# Patient Record
Sex: Female | Born: 1970 | Race: White | Hispanic: No | Smoking: Former smoker
Health system: Southern US, Community
[De-identification: ages and names within clinical notes are randomized; demographics above are authoritative.]

## PROBLEM LIST (undated history)

## (undated) DIAGNOSIS — F32A Depression, unspecified: Secondary | ICD-10-CM

## (undated) DIAGNOSIS — F329 Major depressive disorder, single episode, unspecified: Secondary | ICD-10-CM

## (undated) DIAGNOSIS — E039 Hypothyroidism, unspecified: Secondary | ICD-10-CM

## (undated) DIAGNOSIS — K7290 Hepatic failure, unspecified without coma: Secondary | ICD-10-CM

## (undated) DIAGNOSIS — F209 Schizophrenia, unspecified: Secondary | ICD-10-CM

## (undated) DIAGNOSIS — G43909 Migraine, unspecified, not intractable, without status migrainosus: Secondary | ICD-10-CM

## (undated) DIAGNOSIS — F1010 Alcohol abuse, uncomplicated: Secondary | ICD-10-CM

## (undated) HISTORY — DX: Depression, unspecified: F32.A

## (undated) HISTORY — DX: Migraine, unspecified, not intractable, without status migrainosus: G43.909

## (undated) HISTORY — DX: Major depressive disorder, single episode, unspecified: F32.9

## (undated) HISTORY — DX: Alcohol abuse, uncomplicated: F10.10

## (undated) HISTORY — DX: Hepatic failure, unspecified without coma: K72.90

---

## 1991-11-02 HISTORY — PX: CHOLECYSTECTOMY: SHX55

## 1991-11-02 HISTORY — PX: GASTRIC BYPASS: SHX52

## 2017-09-27 ENCOUNTER — Encounter: Payer: Self-pay | Admitting: Family Medicine

## 2017-12-06 ENCOUNTER — Emergency Department (HOSPITAL_COMMUNITY): Payer: BLUE CROSS/BLUE SHIELD

## 2017-12-06 ENCOUNTER — Inpatient Hospital Stay (HOSPITAL_COMMUNITY)
Admission: EM | Admit: 2017-12-06 | Discharge: 2017-12-16 | DRG: 432 | Disposition: A | Discharge status: Home Health | Payer: BLUE CROSS/BLUE SHIELD | Attending: Internal Medicine | Admitting: Internal Medicine

## 2017-12-06 DIAGNOSIS — K7040 Alcoholic hepatic failure without coma: Secondary | ICD-10-CM | POA: Diagnosis present

## 2017-12-06 DIAGNOSIS — T43595A Adverse effect of other antipsychotics and neuroleptics, initial encounter: Secondary | ICD-10-CM | POA: Diagnosis present

## 2017-12-06 DIAGNOSIS — K7290 Hepatic failure, unspecified without coma: Secondary | ICD-10-CM | POA: Diagnosis not present

## 2017-12-06 DIAGNOSIS — G92 Toxic encephalopathy: Secondary | ICD-10-CM | POA: Diagnosis present

## 2017-12-06 DIAGNOSIS — Z87891 Personal history of nicotine dependence: Secondary | ICD-10-CM | POA: Diagnosis not present

## 2017-12-06 DIAGNOSIS — I9589 Other hypotension: Secondary | ICD-10-CM

## 2017-12-06 DIAGNOSIS — F10231 Alcohol dependence with withdrawal delirium: Secondary | ICD-10-CM | POA: Diagnosis present

## 2017-12-06 DIAGNOSIS — F209 Schizophrenia, unspecified: Secondary | ICD-10-CM | POA: Diagnosis present

## 2017-12-06 DIAGNOSIS — E872 Acidosis: Secondary | ICD-10-CM | POA: Diagnosis present

## 2017-12-06 DIAGNOSIS — K7031 Alcoholic cirrhosis of liver with ascites: Secondary | ICD-10-CM | POA: Diagnosis present

## 2017-12-06 DIAGNOSIS — R600 Localized edema: Secondary | ICD-10-CM | POA: Diagnosis present

## 2017-12-06 DIAGNOSIS — D649 Anemia, unspecified: Secondary | ICD-10-CM | POA: Diagnosis present

## 2017-12-06 DIAGNOSIS — R791 Abnormal coagulation profile: Secondary | ICD-10-CM | POA: Diagnosis not present

## 2017-12-06 DIAGNOSIS — E861 Hypovolemia: Secondary | ICD-10-CM

## 2017-12-06 DIAGNOSIS — E860 Dehydration: Secondary | ICD-10-CM | POA: Diagnosis present

## 2017-12-06 DIAGNOSIS — Y92009 Unspecified place in unspecified non-institutional (private) residence as the place of occurrence of the external cause: Secondary | ICD-10-CM | POA: Diagnosis not present

## 2017-12-06 DIAGNOSIS — M6282 Rhabdomyolysis: Secondary | ICD-10-CM | POA: Diagnosis present

## 2017-12-06 DIAGNOSIS — R748 Abnormal levels of other serum enzymes: Secondary | ICD-10-CM | POA: Diagnosis present

## 2017-12-06 DIAGNOSIS — N390 Urinary tract infection, site not specified: Secondary | ICD-10-CM | POA: Diagnosis present

## 2017-12-06 DIAGNOSIS — K7011 Alcoholic hepatitis with ascites: Secondary | ICD-10-CM

## 2017-12-06 DIAGNOSIS — I959 Hypotension, unspecified: Secondary | ICD-10-CM | POA: Diagnosis present

## 2017-12-06 DIAGNOSIS — E875 Hyperkalemia: Secondary | ICD-10-CM | POA: Diagnosis not present

## 2017-12-06 DIAGNOSIS — R4182 Altered mental status, unspecified: Secondary | ICD-10-CM | POA: Diagnosis not present

## 2017-12-06 DIAGNOSIS — Z9884 Bariatric surgery status: Secondary | ICD-10-CM | POA: Diagnosis not present

## 2017-12-06 DIAGNOSIS — E876 Hypokalemia: Secondary | ICD-10-CM | POA: Diagnosis not present

## 2017-12-06 DIAGNOSIS — R9401 Abnormal electroencephalogram [EEG]: Secondary | ICD-10-CM | POA: Diagnosis present

## 2017-12-06 DIAGNOSIS — F319 Bipolar disorder, unspecified: Secondary | ICD-10-CM

## 2017-12-06 DIAGNOSIS — D72829 Elevated white blood cell count, unspecified: Secondary | ICD-10-CM

## 2017-12-06 DIAGNOSIS — R404 Transient alteration of awareness: Secondary | ICD-10-CM | POA: Diagnosis not present

## 2017-12-06 DIAGNOSIS — G9340 Encephalopathy, unspecified: Secondary | ICD-10-CM

## 2017-12-06 DIAGNOSIS — F1010 Alcohol abuse, uncomplicated: Secondary | ICD-10-CM | POA: Diagnosis not present

## 2017-12-06 DIAGNOSIS — Z781 Physical restraint status: Secondary | ICD-10-CM | POA: Diagnosis not present

## 2017-12-06 LAB — CBC WITH DIFFERENTIAL/PLATELET
BASOS ABS: 0 10*3/uL (ref 0.0–0.1)
Basophils Relative: 0 %
EOS ABS: 0.2 10*3/uL (ref 0.0–0.7)
Eosinophils Relative: 1 %
HCT: 33 % — ABNORMAL LOW (ref 36.0–46.0)
HEMOGLOBIN: 11.1 g/dL — AB (ref 12.0–15.0)
LYMPHS PCT: 9 %
Lymphs Abs: 1.8 10*3/uL (ref 0.7–4.0)
MCH: 38.3 pg — ABNORMAL HIGH (ref 26.0–34.0)
MCHC: 33.6 g/dL (ref 30.0–36.0)
MCV: 113.8 fL — ABNORMAL HIGH (ref 78.0–100.0)
Monocytes Absolute: 1.4 10*3/uL — ABNORMAL HIGH (ref 0.1–1.0)
Monocytes Relative: 7 %
NEUTROS PCT: 83 %
Neutro Abs: 17.1 10*3/uL — ABNORMAL HIGH (ref 1.7–7.7)
Platelets: 365 10*3/uL (ref 150–400)
RBC: 2.9 MIL/uL — AB (ref 3.87–5.11)
RDW: 13.7 % (ref 11.5–15.5)
WBC: 20.5 10*3/uL — AB (ref 4.0–10.5)

## 2017-12-06 LAB — I-STAT VENOUS BLOOD GAS, ED
ACID-BASE DEFICIT: 1 mmol/L (ref 0.0–2.0)
BICARBONATE: 23.7 mmol/L (ref 20.0–28.0)
O2 Saturation: 58 %
PH VEN: 7.415 (ref 7.250–7.430)
TCO2: 25 mmol/L (ref 22–32)
pCO2, Ven: 36.9 mmHg — ABNORMAL LOW (ref 44.0–60.0)
pO2, Ven: 30 mmHg — CL (ref 32.0–45.0)

## 2017-12-06 LAB — COMPREHENSIVE METABOLIC PANEL
ALBUMIN: 2 g/dL — AB (ref 3.5–5.0)
ALK PHOS: 165 U/L — AB (ref 38–126)
ALT: 54 U/L (ref 14–54)
AST: 235 U/L — ABNORMAL HIGH (ref 15–41)
Anion gap: 10 (ref 5–15)
BUN: 17 mg/dL (ref 6–20)
CALCIUM: 8.1 mg/dL — AB (ref 8.9–10.3)
CO2: 19 mmol/L — AB (ref 22–32)
CREATININE: 0.79 mg/dL (ref 0.44–1.00)
Chloride: 105 mmol/L (ref 101–111)
GFR calc Af Amer: >60 mL/min (ref >60)
GFR calc non Af Amer: >60 mL/min (ref >60)
Glucose, Bld: 90 mg/dL (ref 65–99)
Potassium: 5.3 mmol/L — ABNORMAL HIGH (ref 3.5–5.1)
SODIUM: 134 mmol/L — AB (ref 135–145)
Total Bilirubin: 5 mg/dL — ABNORMAL HIGH (ref 0.3–1.2)
Total Protein: 5 g/dL — ABNORMAL LOW (ref 6.5–8.1)

## 2017-12-06 LAB — LITHIUM LEVEL: Lithium Lvl: 1.42 mmol/L — ABNORMAL HIGH (ref 0.60–1.20)

## 2017-12-06 LAB — T4, FREE: Free T4: 1.28 ng/dL — ABNORMAL HIGH (ref 0.61–1.12)

## 2017-12-06 LAB — CBG MONITORING, ED
GLUCOSE-CAPILLARY: 80 mg/dL (ref 65–99)
Glucose-Capillary: 66 mg/dL (ref 65–99)
Glucose-Capillary: 101 mg/dL — ABNORMAL HIGH (ref 65–99)

## 2017-12-06 LAB — AMMONIA: AMMONIA: 41 umol/L — AB (ref 9–35)

## 2017-12-06 LAB — PROTIME-INR
INR: 1.97
Prothrombin Time: 22.3 seconds — ABNORMAL HIGH (ref 11.4–15.2)

## 2017-12-06 LAB — I-STAT TROPONIN, ED: TROPONIN I, POC: 1.18 ng/mL — AB (ref 0.00–0.08)

## 2017-12-06 LAB — LIPASE, BLOOD: Lipase: 54 U/L — ABNORMAL HIGH (ref 11–51)

## 2017-12-06 LAB — ETHANOL: Alcohol, Ethyl (B): <10 mg/dL (ref <10)

## 2017-12-06 LAB — I-STAT BETA HCG BLOOD, ED (MC, WL, AP ONLY)

## 2017-12-06 LAB — CK: CK TOTAL: 1708 U/L — AB (ref 38–234)

## 2017-12-06 LAB — I-STAT CG4 LACTIC ACID, ED: Lactic Acid, Venous: 1.82 mmol/L (ref 0.5–1.9)

## 2017-12-06 LAB — ACETAMINOPHEN LEVEL

## 2017-12-06 LAB — BRAIN NATRIURETIC PEPTIDE: B NATRIURETIC PEPTIDE 5: 295.6 pg/mL — AB (ref 0.0–100.0)

## 2017-12-06 LAB — TSH: TSH: 22.093 u[IU]/mL — AB (ref 0.350–4.500)

## 2017-12-06 LAB — SALICYLATE LEVEL: Salicylate Lvl: <7 mg/dL (ref 2.8–30.0)

## 2017-12-06 MED ORDER — PIPERACILLIN-TAZOBACTAM 3.375 G IVPB
3.3750 g | Freq: Three times a day (TID) | INTRAVENOUS | Status: DC
  Administered 2017-12-07 – 2017-12-10 (×10): 3.375 g via INTRAVENOUS
  Filled 2017-12-06 (×11): qty 50

## 2017-12-06 MED ORDER — VANCOMYCIN HCL 10 G IV SOLR
1250.0000 mg | Freq: Once | INTRAVENOUS | Status: AC
  Administered 2017-12-06: 1250 mg via INTRAVENOUS
  Filled 2017-12-06: qty 1250

## 2017-12-06 MED ORDER — SODIUM CHLORIDE 0.9 % IV BOLUS (SEPSIS)
1000.0000 mL | Freq: Once | INTRAVENOUS | Status: AC
  Administered 2017-12-06: 1000 mL via INTRAVENOUS

## 2017-12-06 MED ORDER — DEXTROSE 50 % IV SOLN
INTRAVENOUS | Status: AC
  Filled 2017-12-06: qty 50

## 2017-12-06 MED ORDER — NOREPINEPHRINE BITARTRATE 1 MG/ML IV SOLN
0.0000 ug/min | INTRAVENOUS | Status: DC
  Administered 2017-12-06: 2 ug/min via INTRAVENOUS
  Filled 2017-12-06: qty 4

## 2017-12-06 MED ORDER — VANCOMYCIN HCL IN DEXTROSE 750-5 MG/150ML-% IV SOLN
750.0000 mg | Freq: Two times a day (BID) | INTRAVENOUS | Status: DC
  Administered 2017-12-07 – 2017-12-08 (×3): 750 mg via INTRAVENOUS
  Filled 2017-12-06 (×6): qty 150

## 2017-12-06 MED ORDER — SODIUM CHLORIDE 0.9 % IV SOLN: INTRAVENOUS | Status: DC | PRN

## 2017-12-06 MED ORDER — HYDROCORTISONE NA SUCCINATE PF 100 MG IJ SOLR
100.0000 mg | Freq: Once | INTRAMUSCULAR | Status: AC
  Administered 2017-12-06: 100 mg via INTRAVENOUS
  Filled 2017-12-06: qty 2

## 2017-12-06 MED ORDER — PIPERACILLIN-TAZOBACTAM 3.375 G IVPB 30 MIN
3.3750 g | Freq: Once | INTRAVENOUS | Status: AC
  Administered 2017-12-06: 3.375 g via INTRAVENOUS
  Filled 2017-12-06: qty 50

## 2017-12-06 NOTE — Procedures (Signed)
Arterial Catheter Insertion Procedure Note Amy Powers [id number] 10-06-1971  Procedure: Insertion of Arterial Catheter  Indications: Blood pressure monitoring  Procedure Details Consent: Unable to obtain consent because of altered level of consciousness. Time Out: Verified patient identification, verified procedure, site/side was marked, verified correct patient position, special equipment/implants available, medications/allergies/relevent history reviewed, required imaging and test results available.  Performed  Maximum sterile technique was used including antiseptics, cap, gloves, gown, hand hygiene, mask and sheet. Skin prep: Chlorhexidine; local anesthetic administered 20 gauge catheter was inserted into right radial artery using the Seldinger technique.  Evaluation Blood flow good; BP tracing good. Complications: No apparent complications.   Amy FeltJean Amy Powers 12/06/2017

## 2017-12-06 NOTE — H&P (Signed)
PCP:   Patient, No Pcp Per   Chief Complaint:  Patient found down  HPI: This is a 47 year old female with history of schizophrenia.  Her mom has not been able to get in contact with her for the last 3 days.  Today she called the sheriff department.  They broke the door down and entered the patient's apartment.  She was found down in her excrement, she was unresponsive.  She was brought to the ER.  In the ER the patient was hypotensive.  She received 3 L IV fluids and was on pressors for brief while.  The patient is unable to give any history and family member is at bedside.  The patient is now awake but disoriented and not very verbal.  She repeats questions asked but is unable to provide any answers.  The patient is disoriented and very tremulous.  A line was placed in the ER in an effort to better monitor her actual blood pressures  Review of Systems:  Unable to obtain  Past Medical History: Schizophrenia, hypothyroidism  Past surgical history: None  Medications: Prior to Admission medications   Medication Sig Start Date End Date Taking? Authorizing Provider  clonazePAM (KLONOPIN) 0.5 MG tablet Take 0.5 mg by mouth daily.   Yes [provider]  lamoTRIgine (LAMICTAL) 200 MG tablet Take 200 mg by mouth at bedtime. 10/05/17  Yes [provider]  LATUDA 60 MG TABS Take 60 mg by mouth at bedtime. 10/05/17  Yes [provider]  lithium carbonate (LITHOBID) 300 MG CR tablet Take 300 mg by mouth 2 (two) times daily. 10/05/17  Yes [provider]  SYNTHROID 100 MCG tablet Take 100 mcg by mouth daily. 09/13/17  Yes [provider]  traZODone (DESYREL) 50 MG tablet Take 50 mg by mouth at bedtime. 10/05/17  Yes [provider]    Allergies:  No Known Allergies  Social History: Patient denies tobacco, alcohol or illicit drug use  Family History: Unable to obtain  Physical Exam: Vitals:   12/06/17 1739 12/06/17 1815 12/06/17 1900 12/06/17  1930  BP:      Pulse: 85 91 94 (!) 110  Resp: (!) 21 (!) 25    Temp:      TempSrc:      SpO2: 100% 100% 100% 100%  Weight:        General:  Awake but not very oriented, well developed and nourished, no acute distress. Right IJ, weak, tremulous Eyes: PERRLA,, swollen red conjunctiva edge of  right eye, no scleral icterus, matting on lashes ENT: dry oral mucosa, neck supple, no thyromegaly Lungs: clear to ascultation, no wheeze, no crackles, no use of accessory muscles Cardiovascular: regular rate and rhythm, no regurgitation, no gallops, no murmurs. No carotid bruits, no JVD Abdomen: soft, positive BS, non-tender, non-distended, no organomegaly, not an acute abdomen GU: not examined Neuro: unable to properly assess due to patient mental state Musculoskeletal: strength 4/5 B/L LE likely d/t effort, no clubbing, cyanosis or edema, A line Skin: no rash, no subcutaneous crepitation, no decubitus Psych: very delayed or no verbal responses   Labs on Admission:  Recent Labs    12/06/17 1411  NA 134*  K 5.3*  CL 105  CO2 19*  GLUCOSE 90  BUN 17  CREATININE 0.79  CALCIUM 8.1*   Recent Labs    12/06/17 1411  AST 235*  ALT 54  ALKPHOS 165*  BILITOT 5.0*  PROT 5.0*  ALBUMIN 2.0*   Recent Labs  12/06/17 1411  LIPASE 54*   Recent Labs    12/06/17 1411  WBC 20.5*  NEUTROABS 17.1*  HGB 11.1*  HCT 33.0*  MCV 113.8*  PLT 365   Recent Labs    12/06/17 1513  CKTOTAL 1,708*   Invalid input(s): POCBNP No results for input(s): DDIMER in the last 72 hours. No results for input(s): HGBA1C in the last 72 hours. No results for input(s): CHOL, HDL, LDLCALC, TRIG, CHOLHDL, LDLDIRECT in the last 72 hours. Recent Labs    12/06/17 1417  TSH 22.093*   No results for input(s): VITAMINB12, FOLATE, FERRITIN, TIBC, IRON, RETICCTPCT in the last 72 hours.  Micro Results: No results found for this or any previous visit (from the past 240 hour(s)).   Radiological Exams on  Admission: Ct Head Wo Contrast  Result Date: 12/06/2017 CLINICAL DATA:  Patient found down today. EXAM: CT HEAD WITHOUT CONTRAST CT CERVICAL SPINE WITHOUT CONTRAST TECHNIQUE: Multidetector CT imaging of the head and cervical spine was performed following the standard protocol without intravenous contrast. Multiplanar CT image reconstructions of the cervical spine were also generated. COMPARISON:  None. FINDINGS: CT HEAD FINDINGS Brain: There is cortical atrophy. No evidence of acute intracranial abnormality including hemorrhage, infarct, mass lesion, mass effect, midline shift or abnormal extra-axial fluid collection. No hydrocephalus or pneumocephalus. Vascular: No hyperdense vessel or unexpected calcification. Skull: Intact. Sinuses/Orbits: Minimal mucosal thickening right maxillary sinus noted. Small amount of fluid is seen the mastoid air cells bilaterally. Other: None. CT CERVICAL SPINE FINDINGS Alignment: Maintained with straightening of cervical lordosis noted. Skull base and vertebrae: No acute fracture. No primary bone lesion or focal pathologic process. Soft tissues and spinal canal: No prevertebral fluid or swelling. No visible canal hematoma. Disc levels: There is some loss of disc space height and endplate spurring at C4-5 and C5-6. Upper chest: Lung apices clear. Other: None. IMPRESSION: No acute abnormality head or cervical spine. Advanced for age cortical atrophy. Mild degenerative disc disease C4-5 and C5-6. Electronically Signed   By: Drusilla Kanner M.D.   On: 12/06/2017 15:34   Ct Cervical Spine Wo Contrast  Result Date: 12/06/2017 CLINICAL DATA:  Patient found down today. EXAM: CT HEAD WITHOUT CONTRAST CT CERVICAL SPINE WITHOUT CONTRAST TECHNIQUE: Multidetector CT imaging of the head and cervical spine was performed following the standard protocol without intravenous contrast. Multiplanar CT image reconstructions of the cervical spine were also generated. COMPARISON:  None. FINDINGS: CT  HEAD FINDINGS Brain: There is cortical atrophy. No evidence of acute intracranial abnormality including hemorrhage, infarct, mass lesion, mass effect, midline shift or abnormal extra-axial fluid collection. No hydrocephalus or pneumocephalus. Vascular: No hyperdense vessel or unexpected calcification. Skull: Intact. Sinuses/Orbits: Minimal mucosal thickening right maxillary sinus noted. Small amount of fluid is seen the mastoid air cells bilaterally. Other: None. CT CERVICAL SPINE FINDINGS Alignment: Maintained with straightening of cervical lordosis noted. Skull base and vertebrae: No acute fracture. No primary bone lesion or focal pathologic process. Soft tissues and spinal canal: No prevertebral fluid or swelling. No visible canal hematoma. Disc levels: There is some loss of disc space height and endplate spurring at C4-5 and C5-6. Upper chest: Lung apices clear. Other: None. IMPRESSION: No acute abnormality head or cervical spine. Advanced for age cortical atrophy. Mild degenerative disc disease C4-5 and C5-6. Electronically Signed   By: Drusilla Kanner M.D.   On: 12/06/2017 15:34   Dg Chest Portable 1 View  Result Date: 12/06/2017 CLINICAL DATA:  Central line placement. No hx of heart  or lung problems. Pt is a nonsmoker. EXAM: PORTABLE CHEST 1 VIEW COMPARISON:  12/06/2017 at 1415 hours FINDINGS: A right internal jugular central venous line has been placed since the prior exam. The tip projects at the caval atrial junction. No pneumothorax. No acute findings in the lungs. No other change from the earlier study. IMPRESSION: Right internal jugular central venous line catheter tip projects at the caval atrial junction. No pneumothorax. Electronically Signed   By: Amie Portlandavid  Ormond M.D.   On: 12/06/2017 18:59   Dg Chest Portable 1 View  Result Date: 12/06/2017 CLINICAL DATA:  The patient was found down on the floor today. The patient reports shortness of breath. No cardiopulmonary history. EXAM: PORTABLE CHEST 1  VIEW COMPARISON:  None in PACs FINDINGS: The lungs are mildly hypoinflated but clear. The heart and pulmonary vascularity are normal. The mediastinum is normal in width. There is no pleural effusion. The bony thorax exhibits no acute abnormality. IMPRESSION: There is no active cardiopulmonary disease. Electronically Signed   By: David  SwazilandJordan M.D.   On: 12/06/2017 14:42    Assessment/Plan Present on Admission: Lithium toxicity -Admit to stepdown -Hold p.o. lithium, lithium levels in a.m.  Patient found down -Blood and urine cultures collected.  Please note note that these were collected hours after antibiotics was initially given -Currently no clear evidence of infection, requesting leukocytosis likely due to demargination.  But given the severity of hypotension in that this could be due to dehydration related and leukocytosis will cover patient with zosyn and vanco.  hypotension resolved  -Tylenol level normal.  We will repeat a Tylenol level now. -UDS ordered, pending collection  . Altered mental status -Ruling out infection.  Imaging currently negative.  UDS pending, urinalysis not yet collected due to the fact the patient was very dry on presentation.  Pressure is better, Foley cath ordered.  . Hypotension -Patient fluid resuscitated with 3 L of fluids. -Continue IV fluid hydration -Before the place, strict I's and O's -Continue IV steroids  . Rhabdomyolysis -Secondary to being down.  Aggressively  hydrated in the ER.  Check a CK in the a.m. continue IV fluid hydration  . Hyperkalemia -Mild, likely much improved with hydration.  Patient's creatinine is normal.  We will repeat an BMP in a.m.  Marland Kitchen. Elevated INR -  Vearl Aitken 12/06/2017, 8:09 PM

## 2017-12-06 NOTE — ED Notes (Signed)
Patient transported to CT. RN with patient.

## 2017-12-06 NOTE — Progress Notes (Signed)
Pharmacy Antibiotic Note  Amy Powers is a 47 y.o. female admitted on 12/06/2017 with sepsis.  Pharmacy has been consulted for Zosyn and vancomycin dosing.  SCr stable, CrCl ~7980ml/min  Plan: Start Zosyn 3.375 gm IV q8h (4 hour infusion) Give vancomycin 1,250mg  IV x 1, then start vancomycin 750mg  IV Q12h Monitor clinical picture, renal function, VT prn F/U C&S, abx deescalation / LOT  Weight: 150 lb (68 kg)  Temp (24hrs), Avg:98.7 F (37.1 C), Min:98.7 F (37.1 C), Max:98.7 F (37.1 C)  Recent Labs  Lab 12/06/17 1411 12/06/17 1425  WBC 20.5*  --   CREATININE 0.79  --   LATICACIDVEN  --  1.82    CrCl cannot be calculated (Unknown ideal weight.).    No Known Allergies  Thank you for allowing pharmacy to be a part of this patient's care.  Amy StammerBATCHELDER,Amy Powers 12/06/2017 4:10 PM

## 2017-12-06 NOTE — ED Triage Notes (Signed)
Pt arrives via ems with reports of AMS. [employer] called out to do well check. Found pt on the floor. LKW 3 days ago. Pt alert to self. RR 32, HR 90, BP 72/48, EMS gave 1000 cc NS en route with BP improving to 106/60. Pt incontinent of urine and feces. Eyes edematous.

## 2017-12-06 NOTE — ED Provider Notes (Signed)
[facility] Provider Note   CSN: [id number] Arrival date & time: 12/06/17  1357     History   Chief Complaint Chief Complaint  Patient presents with  . Altered Mental Status    HPI Amy Powers is a 47 y.o. female.  HPI  [age] year old female presenting with altered mental status.  Patient was found down at home for unknown period of time.  Mom called the police because she had heard from her in several days.  Patient has history of psychiatric illness and is on multiple medications.  Patient also has a history of hypothyroidism.  As per EMS she was found down in her own feces and urine.  No evidence of open bottles, of alcohol or pills around her.  They did a pill count and it seems that she is missed only 2-3 days of medications.  Patient is unable to give any history.  She is able to say her name and follow commands.  Level 5 caveat altered mental status.     With level 5 caveat altered mental status.  No past medical history on file.  There are no active problems to display for this patient.     OB History    No data available       Home Medications    Prior to Admission medications   Not on File    Family History No family history on file.  Social History Social History   Tobacco Use  . Smoking status: Not on file  Substance Use Topics  . Alcohol use: Not on file  . Drug use: Not on file     Allergies   Patient has no allergy information on record.   Review of Systems Review of Systems  Unable to perform ROS: Mental status change  Constitutional: Positive for activity change.     Physical Exam Updated Vital Signs There were no vitals taken for this visit.  Physical Exam  Constitutional:  Appears ill, pale, unable to answer detailed questions.  Tremulous.  HENT:  Head: Normocephalic and atraumatic.  Eyes: Right eye exhibits no discharge. Left eye exhibits discharge.  Scleral edema  bilaterally.  Neck: Neck supple.  Cardiovascular: Normal rate.  Pulmonary/Chest: Effort normal and breath sounds normal.  Abdominal: Soft. Bowel sounds are normal. She exhibits no distension. There is no tenderness.  Patient has bruising on bilateral chest wall.  Neurological:  Oriented to self.  Skin: Skin is warm. She is not diaphoretic.  Covered in excrement.  Nursing note and vitals reviewed.    ED Treatments / Results  Labs (all labs ordered are listed, but only abnormal results are displayed) Labs Reviewed  I-STAT TROPONIN, ED - Abnormal; Notable for the following components:      Result Value   Troponin i, poc 1.18 (*)    All other components within normal limits  I-STAT VENOUS BLOOD GAS, ED - Abnormal; Notable for the following components:   pCO2, Ven 36.9 (*)    pO2, Ven 30.0 (*)    All other components within normal limits  CBC WITH DIFFERENTIAL/PLATELET  COMPREHENSIVE METABOLIC PANEL  BRAIN NATRIURETIC PEPTIDE  AMMONIA  LIPASE, BLOOD  PROTIME-INR  URINALYSIS, ROUTINE W REFLEX MICROSCOPIC  RAPID URINE DRUG SCREEN, HOSP PERFORMED  ACETAMINOPHEN LEVEL  ETHANOL  SALICYLATE LEVEL  LITHIUM LEVEL  TSH  I-STAT CG4 LACTIC ACID, ED  I-STAT BETA HCG BLOOD, ED ([facility], [facility], [facility] ONLY)    EKG  EKG Interpretation None  Radiology No results found.  Procedures .Central Line Date/Time: 12/06/2017 6:27 PM Performed by: Abelino Derrick, MD Authorized by: Abelino Derrick, MD   Consent:    Consent obtained:  Emergent situation Pre-procedure details:    Hand hygiene: Hand hygiene performed prior to insertion     Skin preparation:  2% chlorhexidine and ChloraPrep   Skin preparation agent: Skin preparation agent completely dried prior to procedure   Anesthesia (see MAR for exact dosages):    Anesthesia method:  Local infiltration   Local anesthetic:  Lidocaine 1% WITH epi Procedure details:    Location:  R internal jugular   Site selection rationale:   Least complication   Patient position:  Reverse Trendelenburg   Procedural supplies:  Triple lumen   Catheter size:  7 Fr   Landmarks identified: yes     Ultrasound guidance: yes     Sterile ultrasound techniques: Sterile gel and sterile probe covers were used     Number of attempts:  1   Successful placement: yes   Post-procedure details:    Post-procedure:  Dressing applied and line sutured   Assessment:  Blood return through all ports and free fluid flow   Patient tolerance of procedure:  Tolerated well, no immediate complications   (including critical care time)  CRITICAL CARE Performed by: Arlana Hove Total critical care time: 60 minutes Critical care time was exclusive of separately billable procedures and treating other patients. Critical care was necessary to treat or prevent imminent or life-threatening deterioration. Critical care was time spent personally by me on the following activities: development of treatment plan with patient and/or surrogate as well as nursing, discussions with consultants, evaluation of patient's response to treatment, examination of patient, obtaining history from patient or surrogate, ordering and performing treatments and interventions, ordering and review of laboratory studies, ordering and review of radiographic studies, pulse oximetry and re-evaluation of patient's condition.   Medications Ordered in ED Medications - No data to display   Initial Impression / Assessment and Plan / ED Course  I have reviewed the triage vital signs and the nursing notes.  Pertinent labs & imaging results that were available during my care of the patient were reviewed by me and considered in my medical decision making (see chart for details).     47 year old female presenting with altered mental status.  Patient was found down at home for unknown period of time.  Mom called the police because she had heard from her in several days.  Patient has history  of psychiatric illness and is on multiple medications.  Patient also has a history of hypothyroidism.  As per EMS she was found down in her own feces and urine.  No evidence of open bottles, of alcohol or pills around her.  They did a pill count and it seems that she is missed only 2-3 days of medications.  Patient is unable to give any history.  She is able to say her name and follow commands.  Level 5 caveat altered mental status.  2:57 PM Is very unclear what patient's origin of her altered mental status is.  She appears tremulous, could consider alcohol withdrawal.  The patient does not states she drinks daily.  EMS also found multiple closed bottles of alcohol but no open vitals.  Considered  Myxedema coma given her scleral edema, however with only missing 2-3 days of her thyroid medications this is less likely.  Could consider seizure, from trauma.  Will do CT  head and neck.  Will also get ethanol Tylenol and acetaminophen and aspirin given patient's psychiatric history.  Patient also has been lithium, will get lithium levels.  6:29 PM Central line placed. Discussed with ICU.  Patient's blood pressure normalizing after third liter on pressure bag.  Plan to admit to stepdown.  Levo decreased as third liter fluid went in.  I suspect that this is likely lithium toxicity.  Given that she is supertheapeuticc and she has been down for 2-3 days.  Likely lithium level is much higher previously.  This fits with her coarse tremors, altered mental status,  Final Clinical Impressions(s) / ED Diagnoses   Final diagnoses:  None    ED Discharge Orders    None       Nadeen Shipman, Cindee Salt, MD 12/06/17 1847

## 2017-12-07 ENCOUNTER — Other Ambulatory Visit (HOSPITAL_COMMUNITY): Payer: Self-pay

## 2017-12-07 ENCOUNTER — Inpatient Hospital Stay (HOSPITAL_COMMUNITY): Payer: BLUE CROSS/BLUE SHIELD

## 2017-12-07 DIAGNOSIS — B179 Acute viral hepatitis, unspecified: Secondary | ICD-10-CM

## 2017-12-07 DIAGNOSIS — R748 Abnormal levels of other serum enzymes: Secondary | ICD-10-CM | POA: Insufficient documentation

## 2017-12-07 DIAGNOSIS — R17 Unspecified jaundice: Secondary | ICD-10-CM

## 2017-12-07 DIAGNOSIS — M6282 Rhabdomyolysis: Secondary | ICD-10-CM

## 2017-12-07 LAB — URINALYSIS, ROUTINE W REFLEX MICROSCOPIC
Glucose, UA: 100 mg/dL — AB
Hgb urine dipstick: NEGATIVE
Ketones, ur: 15 mg/dL — AB
LEUKOCYTES UA: NEGATIVE
NITRITE: NEGATIVE
PH: 6 (ref 5.0–8.0)
Protein, ur: NEGATIVE mg/dL
Specific Gravity, Urine: 1.025 (ref 1.005–1.030)

## 2017-12-07 LAB — URINALYSIS, MICROSCOPIC (REFLEX)
RBC / HPF: NONE SEEN RBC/hpf (ref 0–5)
RBC / HPF: NONE SEEN RBC/hpf (ref 0–5)
WBC, UA: NONE SEEN WBC/hpf (ref 0–5)

## 2017-12-07 LAB — CBC
HCT: 35.2 % — ABNORMAL LOW (ref 36.0–46.0)
HEMOGLOBIN: 11.8 g/dL — AB (ref 12.0–15.0)
MCH: 38.7 pg — ABNORMAL HIGH (ref 26.0–34.0)
MCHC: 33.5 g/dL (ref 30.0–36.0)
MCV: 115.4 fL — ABNORMAL HIGH (ref 78.0–100.0)
Platelets: 309 10*3/uL (ref 150–400)
RBC: 3.05 MIL/uL — ABNORMAL LOW (ref 3.87–5.11)
RDW: 14 % (ref 11.5–15.5)
WBC: 18 10*3/uL — AB (ref 4.0–10.5)

## 2017-12-07 LAB — MAGNESIUM: MAGNESIUM: 2.1 mg/dL (ref 1.7–2.4)

## 2017-12-07 LAB — TSH: TSH: 10.712 u[IU]/mL — AB (ref 0.350–4.500)

## 2017-12-07 LAB — BASIC METABOLIC PANEL
ANION GAP: 11 (ref 5–15)
BUN: 15 mg/dL (ref 6–20)
CALCIUM: 8 mg/dL — AB (ref 8.9–10.3)
CO2: 15 mmol/L — ABNORMAL LOW (ref 22–32)
Chloride: 110 mmol/L (ref 101–111)
Creatinine, Ser: 0.78 mg/dL (ref 0.44–1.00)
GFR calc Af Amer: >60 mL/min (ref >60)
GLUCOSE: 107 mg/dL — AB (ref 65–99)
POTASSIUM: 4.3 mmol/L (ref 3.5–5.1)
SODIUM: 136 mmol/L (ref 135–145)

## 2017-12-07 LAB — HEPATIC FUNCTION PANEL
ALT: 56 U/L — ABNORMAL HIGH (ref 14–54)
AST: 178 U/L — ABNORMAL HIGH (ref 15–41)
Albumin: 2 g/dL — ABNORMAL LOW (ref 3.5–5.0)
Alkaline Phosphatase: 170 U/L — ABNORMAL HIGH (ref 38–126)
BILIRUBIN DIRECT: 2.3 mg/dL — AB (ref 0.1–0.5)
BILIRUBIN INDIRECT: 2.6 mg/dL — AB (ref 0.3–0.9)
BILIRUBIN TOTAL: 4.9 mg/dL — AB (ref 0.3–1.2)
Total Protein: 5.5 g/dL — ABNORMAL LOW (ref 6.5–8.1)

## 2017-12-07 LAB — CK TOTAL AND CKMB (NOT AT ARMC)
CK TOTAL: 679 U/L — AB (ref 38–234)
CK, MB: 9.5 ng/mL — ABNORMAL HIGH (ref 0.5–5.0)
RELATIVE INDEX: 1.4 (ref 0.0–2.5)

## 2017-12-07 LAB — T4, FREE: FREE T4: 1.07 ng/dL (ref 0.61–1.12)

## 2017-12-07 LAB — HIV ANTIBODY (ROUTINE TESTING W REFLEX): HIV SCREEN 4TH GENERATION: NONREACTIVE

## 2017-12-07 LAB — RAPID URINE DRUG SCREEN, HOSP PERFORMED
Amphetamines: NOT DETECTED
BARBITURATES: NOT DETECTED
BENZODIAZEPINES: NOT DETECTED
COCAINE: NOT DETECTED
OPIATES: NOT DETECTED
Tetrahydrocannabinol: NOT DETECTED

## 2017-12-07 LAB — ACETAMINOPHEN LEVEL

## 2017-12-07 LAB — PROTIME-INR
INR: 2.88
Prothrombin Time: 29.9 seconds — ABNORMAL HIGH (ref 11.4–15.2)

## 2017-12-07 LAB — TROPONIN I: TROPONIN I: 0.42 ng/mL — AB (ref <0.03)

## 2017-12-07 LAB — LITHIUM LEVEL: LITHIUM LVL: 1.33 mmol/L — AB (ref 0.60–1.20)

## 2017-12-07 LAB — MONONUCLEOSIS SCREEN: Mono Screen: NEGATIVE

## 2017-12-07 MED ORDER — LORAZEPAM 2 MG/ML IJ SOLN
2.0000 mg | INTRAMUSCULAR | Status: DC | PRN
  Administered 2017-12-08 – 2017-12-10 (×4): 2 mg via INTRAVENOUS
  Filled 2017-12-07 (×5): qty 1

## 2017-12-07 MED ORDER — SODIUM CHLORIDE 0.9 % IV BOLUS (SEPSIS)
1000.0000 mL | Freq: Once | INTRAVENOUS | Status: AC
  Administered 2017-12-07: 1000 mL via INTRAVENOUS

## 2017-12-07 MED ORDER — DEXTROSE-NACL 5-0.45 % IV SOLN
INTRAVENOUS | Status: DC
  Administered 2017-12-07: 04:00:00 via INTRAVENOUS

## 2017-12-07 MED ORDER — LACTULOSE 10 GM/15ML PO SOLN: 30.0000 g | Freq: Two times a day (BID) | ORAL | Status: DC

## 2017-12-07 MED ORDER — SODIUM CHLORIDE 0.9 % IV SOLN
INTRAVENOUS | Status: DC
  Administered 2017-12-07: 75 mL/h via INTRAVENOUS
  Administered 2017-12-08 (×2): via INTRAVENOUS

## 2017-12-07 MED ORDER — POLYETHYLENE GLYCOL 3350 17 G PO PACK: 17.0000 g | PACK | Freq: Every day | ORAL | Status: DC | PRN

## 2017-12-07 MED ORDER — ONDANSETRON HCL 4 MG PO TABS: 4.0000 mg | ORAL_TABLET | Freq: Four times a day (QID) | ORAL | Status: DC | PRN

## 2017-12-07 MED ORDER — LEVOTHYROXINE SODIUM 100 MCG IV SOLR
50.0000 ug | Freq: Every day | INTRAVENOUS | Status: DC
  Administered 2017-12-07 – 2017-12-11 (×5): 50 ug via INTRAVENOUS
  Filled 2017-12-07 (×5): qty 5

## 2017-12-07 MED ORDER — DEXTROSE 5 % IV SOLN
INTRAVENOUS | Status: DC
  Administered 2017-12-07 – 2017-12-10 (×7): via INTRAVENOUS
  Filled 2017-12-07 (×10): qty 150

## 2017-12-07 MED ORDER — ONDANSETRON HCL 4 MG/2ML IJ SOLN: 4.0000 mg | Freq: Four times a day (QID) | INTRAMUSCULAR | Status: DC | PRN

## 2017-12-07 MED ORDER — METHYLPREDNISOLONE SODIUM SUCC 125 MG IJ SOLR
60.0000 mg | Freq: Two times a day (BID) | INTRAMUSCULAR | Status: DC
  Administered 2017-12-08 – 2017-12-10 (×5): 60 mg via INTRAVENOUS
  Filled 2017-12-07 (×5): qty 2

## 2017-12-07 MED ORDER — LACTULOSE ENEMA
300.0000 mL | Freq: Every day | ORAL | Status: DC
  Administered 2017-12-07: 300 mL via RECTAL
  Filled 2017-12-07 (×2): qty 300

## 2017-12-07 MED ORDER — ENOXAPARIN SODIUM 40 MG/0.4ML ~~LOC~~ SOLN
40.0000 mg | Freq: Every day | SUBCUTANEOUS | Status: DC
  Filled 2017-12-07: qty 0.4

## 2017-12-07 MED ORDER — FOLIC ACID 5 MG/ML IJ SOLN
1.0000 mg | Freq: Every day | INTRAMUSCULAR | Status: DC
  Administered 2017-12-07 – 2017-12-11 (×5): 1 mg via INTRAVENOUS
  Filled 2017-12-07 (×6): qty 0.2

## 2017-12-07 MED ORDER — LEVOTHYROXINE SODIUM 100 MCG PO TABS
100.0000 ug | ORAL_TABLET | Freq: Every day | ORAL | Status: DC
  Filled 2017-12-07: qty 1

## 2017-12-07 MED ORDER — ACETAMINOPHEN 325 MG PO TABS: 650.0000 mg | ORAL_TABLET | Freq: Four times a day (QID) | ORAL | Status: DC | PRN

## 2017-12-07 MED ORDER — PANTOPRAZOLE SODIUM 40 MG IV SOLR
40.0000 mg | Freq: Two times a day (BID) | INTRAVENOUS | Status: DC
  Administered 2017-12-07 – 2017-12-09 (×6): 40 mg via INTRAVENOUS
  Filled 2017-12-07 (×6): qty 40

## 2017-12-07 MED ORDER — ACETAMINOPHEN 650 MG RE SUPP: 650.0000 mg | Freq: Four times a day (QID) | RECTAL | Status: DC | PRN

## 2017-12-07 MED ORDER — THIAMINE HCL 100 MG/ML IJ SOLN
100.0000 mg | Freq: Every day | INTRAMUSCULAR | Status: DC
  Administered 2017-12-07 – 2017-12-11 (×5): 100 mg via INTRAVENOUS
  Filled 2017-12-07 (×4): qty 2

## 2017-12-07 MED ORDER — SODIUM CHLORIDE 0.9 % IV BOLUS (SEPSIS): 500.0000 mL | Freq: Once | INTRAVENOUS | Status: DC

## 2017-12-07 MED ORDER — METHYLPREDNISOLONE SODIUM SUCC 125 MG IJ SOLR
60.0000 mg | Freq: Two times a day (BID) | INTRAMUSCULAR | Status: DC
  Administered 2017-12-07 (×2): 60 mg via INTRAVENOUS
  Filled 2017-12-07 (×2): qty 2

## 2017-12-07 NOTE — ED Notes (Signed)
Pt mother's phone numbers: CELL - [phone]; Home [phone]

## 2017-12-07 NOTE — Consult Note (Signed)
[facility] Consult: 12:06 PM 12/07/2017  LOS: 1 day    Referring Provider: Dr Tyrell Antonio  Primary Care Physician:  Patient, No Pcp Per Primary Gastroenterologist:  unassigned  [facility] cell [phone]  Home [phone].      Reason for Consultation:  "Liver failure"   HPI: Yuleni Burich is a 47 y.o. female.  PMH psychiatric illness unspecified, on multiple psych meds.  Hypothyroidism.  S/p gastric bypass age 31 in [place].     Police called to pt's residence for [place].  Mom alerted neighbor who alerted police as she had not heard from [place] in several days.  Found on floor. Incontinent of stool and urine. BP 72/48, HR 90, RR 32.  Reported "LKW" 3 days ago. Pill count suggested she had missed 2 to 3 days of meds.  BP improved with bolus IVF.   tox screen and ETOH level negative.   t bili 4.9.  Alk phos 170.  AST/ALT 178/56.  Ammonia 41. PT/INR 22/1.9  CK 679, MB 9.5.  Troponin I 0.4.   WBC 18.  Hgb 11.8.  MCV 115.  Platelets 309.  APAP, Salicylate levels not elevated, lithium level elevated.  TSH 10.7, free T4 1.0. Ab ultrasound: GB not well seen but suspected GB wall thickening.  Coarse liver: fatty liver vs hepatocellular dz.  Patent PV.  CBD 6.5 MM.    Small to moderate ascites and small fluid adjacent to GB. CT head/c spine: atrophy, DDD.      Patient is alert but confused and aphasic.  It is hard to know if what she affirms is true or not but she may drink wine heavily.  She also says that a few years ago she was referred to a MD for evaluation of problems with her LFTs.  However there is nothing in epic to confirm previous Liver testing or imaging.  All of her medical care is through an outpatient psychiatric provider She denies abdominal pain or nausea/vomiting.  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  Mom says she is noticeably depressed and thinks she might be drinking more than normal.  Not eating as well as she ought to.  As of Sunday, family unable to reach her by phone.  She has been on Lithium for 2 or 3 years.    PMH: per HPI There are no old records in Epic.      Prior to Admission medications   Medication Sig Start Date End Date Taking? Authorizing Provider  clonazePAM (KLONOPIN) 0.5 MG tablet Take 0.5 mg by mouth daily.   Yes [provider]  lamoTRIgine (LAMICTAL) 200 MG tablet Take 200 mg by mouth at bedtime. 10/05/17  Yes [provider]  LATUDA 60 MG TABS Take 60 mg by mouth at bedtime. 10/05/17  Yes [provider]  lithium carbonate (LITHOBID) 300 MG CR tablet Take 300 mg by mouth  2 (two) times daily. 10/05/17  Yes [provider]  SYNTHROID 100 MCG tablet Take 100 mcg by mouth daily. 09/13/17  Yes [provider]  traZODone (DESYREL) 50 MG tablet Take 50 mg by mouth at bedtime. 10/05/17  Yes [provider]    Scheduled Meds: . enoxaparin (LOVENOX) injection  40 mg Subcutaneous Daily  . folic acid  1 mg Intravenous Daily  . lactulose  300 mL Rectal Daily  . levothyroxine  50 mcg Intravenous Daily  . methylPREDNISolone (SOLU-MEDROL) injection  60 mg Intravenous Q12H  . pantoprazole (PROTONIX) IV  40 mg Intravenous Q12H  . thiamine  100 mg Intravenous Daily   Infusions: . sodium chloride    . piperacillin-tazobactam (ZOSYN)  IV Stopped (12/07/17 0524)  .  sodium bicarbonate  infusion 1000 mL 100 mL/hr at 12/07/17 1041  . vancomycin Stopped (12/07/17 0800)   PRN Meds: Place/Maintain arterial line **AND** sodium chloride, acetaminophen **OR** acetaminophen, LORazepam, ondansetron **OR** ondansetron (ZOFRAN) IV, polyethylene glycol   Allergies as of 12/06/2017  .  (No Known Allergies)    No family history on file.  Social History   Socioeconomic History  . Marital status: Divorced    Spouse name: Not on file  . Number of children: Not on file  . Years of education: Not on file  . Highest education level: Not on file  Social Needs  . Financial resource strain: Not on file  . Food insecurity - worry: Not on file  . Food insecurity - inability: Not on file  . Transportation needs - medical: Not on file  . Transportation needs - non-medical: Not on file  Occupational History  . Not on file  Tobacco Use  . Smoking status: Not on file  Substance and Sexual Activity  . Alcohol use: Not on file  . Drug use: Not on file  . Sexual activity: Not on file  Other Topics Concern  . Not on file  Social History Narrative  . Not on file    REVIEW OF SYSTEMS: Review of systems is not reliable as the patient is not a reliable historian. Constitutional: Denies weakness. ENT:  No nose bleeds Pulm: Denies shortness of breath and cough. CV:  No palpitations, no LE edema.  GU:  No hematuria, no frequency GI: Denies nausea, vomiting, trouble swallowing, bloody stools or dark stools. Heme: Denies unusual bleeding or bruising.  Denies previous history of anemia. Transfusions: Does not recall previous transfusions. Neuro:  No headaches, no peripheral tingling or numbness Derm:  No itching, no rash or sores.  Endocrine:  No sweats or chills.  No polyuria or dysuria Immunization: Not known. Travel:  None beyond local counties in last few months.    PHYSICAL EXAM: Vital signs in last 24 hours: Vitals:   12/07/17 1114 12/07/17 1144  BP:    Pulse:    Resp: 18 (!) 21  Temp:    SpO2: (!) 87% 97%   Wt Readings from Last 3 Encounters:  12/06/17 68 kg (150 lb)    General: Patient actually looks well, appears her stated age.  She talks but has trouble finding words and uses the wrong words. Head: Some bruising at the base of the right side of the  neck, question did they try to start a central line question Eyes: No scleral icterus.  No conjunctival pallor.  EOMI. Ears: Not hard of hearing. Nose: No congestion or discharge. Mouth: Oral mucosa is clear but very dry.  Tongue midline.  Fair  dentition. Neck: Some bruising at the base of the right side of the neck.  No thyromegaly or mass. Lungs: Clear bilaterally.  No shortness of breath or cough Heart: RRR.  No MRG.  S1, S2 present Abdomen: Soft.  Overweight.  Not tender or distended.  Active bowel sounds.  No organomegaly, bruits, hernias, masses. GU:  Foley in place contains turbid, pink urine.   Rectal: Deferred Musc/Skeltl: No joint redness, swelling or significant deformity. Extremities: No CCE.  Feet are warm and well perfused with brisk capillary refill. Neurologic: She cannot tell me the year, the place.  She can tell me her mother's name but cannot tell me her phone number.  When I held up a pen in front of her and asked her to tell me what it was she said it was a knife.  Is able to move all 4 limbs but is not able to follow even simple commands.  Possible asterixis. Skin: A few tiny telangiectasia across the upper chest Nodes: No cervical adenopathy. Psych: Calm, cooperative, pleasant  Intake/Output from previous day: No intake/output data recorded. Intake/Output this shift: No intake/output data recorded.  LAB RESULTS: Recent Labs    12/06/17 1411 12/07/17 0308  WBC 20.5* 18.0*  HGB 11.1* 11.8*  HCT 33.0* 35.2*  PLT 365 309   BMET Lab Results  Component Value Date   NA 136 12/07/2017   NA 134 (L) 12/06/2017   K 4.3 12/07/2017   K 5.3 (H) 12/06/2017   CL 110 12/07/2017   CL 105 12/06/2017   CO2 15 (L) 12/07/2017   CO2 19 (L) 12/06/2017   GLUCOSE 107 (H) 12/07/2017   GLUCOSE 90 12/06/2017   BUN 15 12/07/2017   BUN 17 12/06/2017   CREATININE 0.78 12/07/2017   CREATININE 0.79 12/06/2017   CALCIUM 8.0 (L) 12/07/2017   CALCIUM 8.1 (L) 12/06/2017    LFT Recent Labs    12/06/17 1411 12/07/17 0941  PROT 5.0* 5.5*  ALBUMIN 2.0* 2.0*  AST 235* 178*  ALT 54 56*  ALKPHOS 165* 170*  BILITOT 5.0* 4.9*  BILIDIR  --  2.3*  IBILI  --  2.6*   PT/INR Lab Results  Component Value Date   INR 1.97 12/06/2017   Hepatitis Panel No results for input(s): HEPBSAG, HCVAB, HEPAIGM, HEPBIGM in the last 72 hours. C-Diff No components found for: CDIFF Lipase     Component Value Date/Time   LIPASE 54 (H) 12/06/2017 1411    Drugs of Abuse     Component Value Date/Time   LABOPIA NONE DETECTED 12/06/2017 1416   COCAINSCRNUR NONE DETECTED 12/06/2017 1416   LABBENZ NONE DETECTED 12/06/2017 1416   AMPHETMU NONE DETECTED 12/06/2017 1416   THCU NONE DETECTED 12/06/2017 1416   LABBARB NONE DETECTED 12/06/2017 1416     RADIOLOGY STUDIES: Ct Head Wo Contrast  Result Date: 12/06/2017 CLINICAL DATA:  Patient found down today. EXAM: CT HEAD WITHOUT CONTRAST CT CERVICAL SPINE WITHOUT CONTRAST TECHNIQUE: Multidetector CT imaging of the head and cervical spine was performed following the standard protocol without intravenous contrast. Multiplanar CT image reconstructions of the cervical spine were also generated. COMPARISON:  None. FINDINGS: CT HEAD FINDINGS Brain: There is cortical atrophy. No evidence of acute intracranial abnormality including hemorrhage, infarct, mass lesion, mass effect, midline shift or abnormal extra-axial fluid collection. No hydrocephalus or pneumocephalus. Vascular: No hyperdense vessel or unexpected calcification. Skull: Intact. Sinuses/Orbits: Minimal mucosal thickening right maxillary sinus noted. Small amount of fluid is seen the mastoid air cells bilaterally.  Other: None. CT CERVICAL SPINE FINDINGS Alignment: Maintained with straightening of cervical lordosis noted. Skull base and vertebrae: No acute fracture. No primary bone lesion or focal pathologic process. Soft tissues and spinal canal: No prevertebral fluid or swelling.  No visible canal hematoma. Disc levels: There is some loss of disc space height and endplate spurring at F0-2 and C5-6. Upper chest: Lung apices clear. Other: None. IMPRESSION: No acute abnormality head or cervical spine. Advanced for age cortical atrophy. Mild degenerative disc disease C4-5 and C5-6. Electronically Signed   By: Inge Rise M.D.   On: 12/06/2017 15:34   Ct Cervical Spine Wo Contrast  Result Date: 12/06/2017 CLINICAL DATA:  Patient found down today. EXAM: CT HEAD WITHOUT CONTRAST CT CERVICAL SPINE WITHOUT CONTRAST TECHNIQUE: Multidetector CT imaging of the head and cervical spine was performed following the standard protocol without intravenous contrast. Multiplanar CT image reconstructions of the cervical spine were also generated. COMPARISON:  None. FINDINGS: CT HEAD FINDINGS Brain: There is cortical atrophy. No evidence of acute intracranial abnormality including hemorrhage, infarct, mass lesion, mass effect, midline shift or abnormal extra-axial fluid collection. No hydrocephalus or pneumocephalus. Vascular: No hyperdense vessel or unexpected calcification. Skull: Intact. Sinuses/Orbits: Minimal mucosal thickening right maxillary sinus noted. Small amount of fluid is seen the mastoid air cells bilaterally. Other: None. CT CERVICAL SPINE FINDINGS Alignment: Maintained with straightening of cervical lordosis noted. Skull base and vertebrae: No acute fracture. No primary bone lesion or focal pathologic process. Soft tissues and spinal canal: No prevertebral fluid or swelling. No visible canal hematoma. Disc levels: There is some loss of disc space height and endplate spurring at D7-4 and C5-6. Upper chest: Lung apices clear. Other: None. IMPRESSION: No acute abnormality head or cervical spine. Advanced for age cortical atrophy. Mild degenerative disc disease C4-5 and C5-6. Electronically Signed   By: Inge Rise M.D.   On: 12/06/2017 15:34   Dg Chest Portable 1 View  Result Date:  12/06/2017 CLINICAL DATA:  Central line placement. No hx of heart or lung problems. Pt is a nonsmoker. EXAM: PORTABLE CHEST 1 VIEW COMPARISON:  12/06/2017 at 1415 hours FINDINGS: A right internal jugular central venous line has been placed since the prior exam. The tip projects at the caval atrial junction. No pneumothorax. No acute findings in the lungs. No other change from the earlier study. IMPRESSION: Right internal jugular central venous line catheter tip projects at the caval atrial junction. No pneumothorax. Electronically Signed   By: Lajean Manes M.D.   On: 12/06/2017 18:59   Dg Chest Portable 1 View  Result Date: 12/06/2017 CLINICAL DATA:  The patient was found down on the floor today. The patient reports shortness of breath. No cardiopulmonary history. EXAM: PORTABLE CHEST 1 VIEW COMPARISON:  None in PACs FINDINGS: The lungs are mildly hypoinflated but clear. The heart and pulmonary vascularity are normal. The mediastinum is normal in width. There is no pleural effusion. The bony thorax exhibits no acute abnormality. IMPRESSION: There is no active cardiopulmonary disease. Electronically Signed   By: David  Martinique M.D.   On: 12/06/2017 14:42   US Abdomen Limited Ruq  Result Date: 12/06/2017 CLINICAL DATA:  Elevated liver enzymes EXAM: ULTRASOUND ABDOMEN LIMITED RIGHT UPPER QUADRANT COMPARISON:  None. FINDINGS: Gallbladder: Poorly visualized, possibly contracted. Thickened appearing wall measuring 3.8 mm. Positive sonographic Murphy. Common bile duct: Diameter: Borderline to slightly enlarged at 6 0.5 mm Liver: Slightly enlarged at 17.6 cm. Coarse echogenic liver. Portal vein is patent on color Doppler imaging  with bidirectional flow. Incidentally noted is small to moderate ascites within the lower quadrants. There is ascites adjacent to the liver and small amount of pericholecystic fluid. IMPRESSION: 1. Difficult visualization of gallbladder. Suspect that there is gallbladder wall thickening up to  3.8 mm and sonographer reports positive sonographic Percell Miller although no definitive shadowing stones are seen. Acute acalculous cholecystitis could be considered, although gallbladder wall thickening may also be seen in the setting of liver disease. Nuclear medicine study could be helpful to further evaluate. 2. Coarse enlarged echogenic liver consistent with hepatocellular disease and/or fatty infiltration. Bidirectional flow in the portal veins suggests elevated portal pressures. Small to moderate ascites with small fluid adjacent to the gallbladder. Electronically Signed   By: Donavan Foil M.D.   On: 12/06/2017 21:24     IMPRESSION:   *  Jaundice, acute hepatitis.  CT with ? Cirrhosis and or fatty liver.  ? Shock liver in setting of being down for (how many days?) ETOH hx unknown, Mom says she is not a known heavy drinker.  No ETOH bottles found at scene.   + coagulopathy and ascites.    *  AMS.  CT head non-acute but + atrophy advanced for age.  tox screen/ETOH level negative.  Unspecified psychiatric disorder but given that she is on lithium, suspect bipolar disorder. Lithium level mildly elevated, ? Lithium toxicity.    *  Rhabdomyolisis after down (for a few days?).  Renal function not compromised.     *  ? Sepsis.  On Solumedrol, Zosyn, vancomycin.   U/A negative for UTI.  CXR negative for PNA, CHF etc. CK trending down with IV fluids.       *  Hypothyroidism.    *  Elevated Troponin, serial labs ordered.  Suspicion is for demand ischemia.      PLAN:     *   ANA, AMA, smooth muscle Ab, alpha 1 AT, ceruloplasmin, pt/inr, cmet in AM.    *  Dr R ordered rectal lactulose.    *  I added 75/hour of NS as only IVF is bicarb at 133m /hour.  Did get 1 liter bolus earlier.      SAzucena Freed 12/07/2017, 12:06 PM Pager: [phone]

## 2017-12-07 NOTE — ED Notes (Signed)
IV at left AC.does not pull back and is not fuctional.

## 2017-12-07 NOTE — ED Notes (Signed)
IV at right forearm flushed witoput pain or swelling.

## 2017-12-07 NOTE — ED Notes (Signed)
Spoke with Amy ClearKerpy NP advised patient pulled out central line. Patient currently does not have any medication going thorough central line at this time. NP advised to cut sutures and remove.

## 2017-12-07 NOTE — ED Notes (Signed)
Pt has artline at right wrist. No redness swelling or drainage noted. Pt bends wrist and line waveform flattens and low bp noted. Wrist in nuetral position has BP WNL.

## 2017-12-07 NOTE — ED Notes (Signed)
SDU 

## 2017-12-07 NOTE — ED Notes (Signed)
Assumed pt care at this time. Pt is resting comfortably in stretcher, alert to person. RR 18-20, in NARD. Pt remains on cardiac  Monitor w/ art line BP monitoring. Art line is secured to R arm. All other VSS, NS infusing at 125.

## 2017-12-07 NOTE — ED Notes (Signed)
Pharmacy contacted for vanc 

## 2017-12-07 NOTE — ED Notes (Signed)
Attempted to call report

## 2017-12-07 NOTE — ED Notes (Signed)
Labs drawn from art line and flushed to clear. Denies c/o no redness swelling or drainage noted.

## 2017-12-07 NOTE — ED Notes (Signed)
Lab at bedsdie 

## 2017-12-07 NOTE — ED Notes (Signed)
Ordered hospital bed 

## 2017-12-07 NOTE — ED Notes (Signed)
Dr. Carmell Austriaegaldo contacted as family requesting information. Also madfe aware that [place]. had told to hold lactulose enema until seen by GI MD.

## 2017-12-07 NOTE — ED Notes (Signed)
MD contacted with troponin results.

## 2017-12-07 NOTE — Progress Notes (Addendum)
PROGRESS NOTE    Amy Powers  WGN:[id number] DOB: [birth year] DOA: 12/06/2017 PCP: Patient, No Pcp Per     Brief Narrative: This is a 47 year old female with history of schizophrenia.  Her mom has not been able to get in contact with her for the last 3 days.  Today she called the sheriff department.  They broke the door down and entered the patient's apartment.  She was found down in her excrement, she was unresponsive.  She was brought to the ER.  In the ER the patient was hypotensive.  She received 3 L IV fluids and was on pressors for brief while.  The patient is unable to give any history and family member is at bedside.  The patient is now awake but disoriented and not very verbal.  She repeats questions asked but is unable to provide any answers.  The patient is disoriented and very tremulous.  A line was placed in the ER in an effort to better monitor her actual blood pressures.     Assessment & Plan:   Principal Problem:   Hypotension Active Problems:   Altered mental status   Rhabdomyolysis   Schizophrenia (HCC)   Leukocytosis   Hyperkalemia   Elevated INR   Acute encephalopathy; this is likely multifactorial, encephalopathy, infection (UTI), also lithium toxicity. hypothyroidism Lithium level trending down 1.4--1.3. Monitor level.  Ammonia level at 40, mildly elevated--started lactulose per rectum.  Elevated TSH; continue with synthroid IV.  Suspect UTI; urine appears infected in bag. continue with IV antibiotics.  PCO2 was not elevated on blood gas.  Possible alcohol use; monitor CIWA. Thiamine and folate.   Discussed with mother who was at bedside this afternoon. She last spoke with patient on Friday, patient was nnot confuse but has been depressed. She lost her job in December. Patient drinks wine, mother doesn't know how much she drinks or has been drinking.   If no significant improvement in 24 hours consider MRI brain,   Lithium toxicity; could explain AMS, level  only at 1.4---trending down to 1.3. Not as elevated to required HD.   Sepsis; Hypotension. Presents with hypotension, leukocytosis,  suspect hypovolemia and sepsis. Continue with broad spectrum antibiotics. Check urine culture, follow blood cultures.   Rhabdomyolysis; ck trending down with IV fluids.  1708---679.  Hyperkalemia; resolved with IV fluids.   Elevated troponin; suspect demand. Check ECHO, peak to 1, trending down to 0.4  Elevated INR> liver failure. Follow trend.   Transaminases; liver failure,  US; Difficult visualization of gallbladder. Suspect that there is gallbladder wall thickening up to 3.8 mm and sonographer reports positive sonographic Eulah PontMurphy although no definitive shadowing stones are seen. Acute acalculous cholecystitis could be considered, although gallbladder wall thickening may also be seen in the setting of liver disease. Nuclear medicine study could be helpful to further evaluate. Coarse enlarged echogenic liver consistent with hepatocellular disease and/or fatty infiltration.  -GI consulted.  -question alcohol use.   Metabolic acidosis; IV fluids, bicarb. Reasses IV fluids in 24 hours.   Hypothyroidism; TSH 22--10. Free T 4;1.28 Continue with IV synthroid.     DVT prophylaxis: SCD, until make sure INR stable Code Status: full code.  Family Communication: no family at bedside.  Disposition Plan: remain inpatient, to be determine.    Consultants:   GI     Procedures: US   Antimicrobials:   Vancomycin   Zosyn.    Subjective: She is lethargic, she open eyes, say few words, very slow speech. , repeat  herself, over and over.   Objective: Vitals:   12/07/17 0645 12/07/17 0715 12/07/17 0730 12/07/17 0745  BP:      Pulse: 73 75 72 73  Resp: 18 (!) 28 20 (!) 23  Temp:      TempSrc:      SpO2: 100% 100% 100% 100%  Weight:       No intake or output data in the 24 hours ending 12/07/17 0832 Filed Weights   12/06/17 1600  Weight: 68 kg  (150 lb)    Examination:  General exam: lethargic, NAD, icteric  Respiratory system: Clear to auscultation. Respiratory effort normal. Cardiovascular system: S1 & S2 heard, RRR. No JVD, murmurs, rubs, gallops or clicks. No pedal edema. Gastrointestinal system: Abdomen is nondistended, soft and nontender. No organomegaly or masses felt. Normal bowel sounds heard. Central nervous system: lethargic, able to tell me her name and last name, appears non focal.  Extremities: Symmetric 5 x 5 power. Skin: No rashes, lesions or ulcers    Data Reviewed: I have personally reviewed following labs and imaging studies  CBC: Recent Labs  Lab 12/06/17 1411 12/07/17 0308  WBC 20.5* 18.0*  NEUTROABS 17.1*  --   HGB 11.1* 11.8*  HCT 33.0* 35.2*  MCV 113.8* 115.4*  PLT 365 309   Basic Metabolic Panel: Recent Labs  Lab 12/06/17 1411 12/07/17 0308  NA 134* 136  K 5.3* 4.3  CL 105 110  CO2 19* 15*  GLUCOSE 90 107*  BUN 17 15  CREATININE 0.79 0.78  CALCIUM 8.1* 8.0*   GFR: CrCl cannot be calculated (Unknown ideal weight.). Liver Function Tests: Recent Labs  Lab 12/06/17 1411  AST 235*  ALT 54  ALKPHOS 165*  BILITOT 5.0*  PROT 5.0*  ALBUMIN 2.0*   Recent Labs  Lab 12/06/17 1411  LIPASE 54*   Recent Labs  Lab 12/06/17 1513  AMMONIA 41*   Coagulation Profile: Recent Labs  Lab 12/06/17 1411  INR 1.97   Cardiac Enzymes: Recent Labs  Lab 12/06/17 1513  CKTOTAL 1,708*   BNP (last 3 results) No results for input(s): PROBNP in the last 8760 hours. HbA1C: No results for input(s): HGBA1C in the last 72 hours. CBG: Recent Labs  Lab 12/06/17 1406 12/06/17 1606 12/06/17 1726  GLUCAP 80 66 101*   Lipid Profile: No results for input(s): CHOL, HDL, LDLCALC, TRIG, CHOLHDL, LDLDIRECT in the last 72 hours. Thyroid Function Tests: Recent Labs    12/06/17 1417 12/07/17 0308  TSH 22.093* 10.712*  FREET4 1.28*  --    Anemia Panel: No results for input(s):  VITAMINB12, FOLATE, FERRITIN, TIBC, IRON, RETICCTPCT in the last 72 hours. Sepsis Labs: Recent Labs  Lab 12/06/17 1425  LATICACIDVEN 1.82    No results found for this or any previous visit (from the past 240 hour(s)).       Radiology Studies: Ct Head Wo Contrast  Result Date: 12/06/2017 CLINICAL DATA:  Patient found down today. EXAM: CT HEAD WITHOUT CONTRAST CT CERVICAL SPINE WITHOUT CONTRAST TECHNIQUE: Multidetector CT imaging of the head and cervical spine was performed following the standard protocol without intravenous contrast. Multiplanar CT image reconstructions of the cervical spine were also generated. COMPARISON:  None. FINDINGS: CT HEAD FINDINGS Brain: There is cortical atrophy. No evidence of acute intracranial abnormality including hemorrhage, infarct, mass lesion, mass effect, midline shift or abnormal extra-axial fluid collection. No hydrocephalus or pneumocephalus. Vascular: No hyperdense vessel or unexpected calcification. Skull: Intact. Sinuses/Orbits: Minimal mucosal thickening right maxillary sinus noted. Small  amount of fluid is seen the mastoid air cells bilaterally. Other: None. CT CERVICAL SPINE FINDINGS Alignment: Maintained with straightening of cervical lordosis noted. Skull base and vertebrae: No acute fracture. No primary bone lesion or focal pathologic process. Soft tissues and spinal canal: No prevertebral fluid or swelling. No visible canal hematoma. Disc levels: There is some loss of disc space height and endplate spurring at C4-5 and C5-6. Upper chest: Lung apices clear. Other: None. IMPRESSION: No acute abnormality head or cervical spine. Advanced for age cortical atrophy. Mild degenerative disc disease C4-5 and C5-6. Electronically Signed   By: Drusilla Kanner M.D.   On: 12/06/2017 15:34   Ct Cervical Spine Wo Contrast  Result Date: 12/06/2017 CLINICAL DATA:  Patient found down today. EXAM: CT HEAD WITHOUT CONTRAST CT CERVICAL SPINE WITHOUT CONTRAST TECHNIQUE:  Multidetector CT imaging of the head and cervical spine was performed following the standard protocol without intravenous contrast. Multiplanar CT image reconstructions of the cervical spine were also generated. COMPARISON:  None. FINDINGS: CT HEAD FINDINGS Brain: There is cortical atrophy. No evidence of acute intracranial abnormality including hemorrhage, infarct, mass lesion, mass effect, midline shift or abnormal extra-axial fluid collection. No hydrocephalus or pneumocephalus. Vascular: No hyperdense vessel or unexpected calcification. Skull: Intact. Sinuses/Orbits: Minimal mucosal thickening right maxillary sinus noted. Small amount of fluid is seen the mastoid air cells bilaterally. Other: None. CT CERVICAL SPINE FINDINGS Alignment: Maintained with straightening of cervical lordosis noted. Skull base and vertebrae: No acute fracture. No primary bone lesion or focal pathologic process. Soft tissues and spinal canal: No prevertebral fluid or swelling. No visible canal hematoma. Disc levels: There is some loss of disc space height and endplate spurring at C4-5 and C5-6. Upper chest: Lung apices clear. Other: None. IMPRESSION: No acute abnormality head or cervical spine. Advanced for age cortical atrophy. Mild degenerative disc disease C4-5 and C5-6. Electronically Signed   By: Drusilla Kanner M.D.   On: 12/06/2017 15:34   Dg Chest Portable 1 View  Result Date: 12/06/2017 CLINICAL DATA:  Central line placement. No hx of heart or lung problems. Pt is a nonsmoker. EXAM: PORTABLE CHEST 1 VIEW COMPARISON:  12/06/2017 at 1415 hours FINDINGS: A right internal jugular central venous line has been placed since the prior exam. The tip projects at the caval atrial junction. No pneumothorax. No acute findings in the lungs. No other change from the earlier study. IMPRESSION: Right internal jugular central venous line catheter tip projects at the caval atrial junction. No pneumothorax. Electronically Signed   By: Amie Portland M.D.   On: 12/06/2017 18:59   Dg Chest Portable 1 View  Result Date: 12/06/2017 CLINICAL DATA:  The patient was found down on the floor today. The patient reports shortness of breath. No cardiopulmonary history. EXAM: PORTABLE CHEST 1 VIEW COMPARISON:  None in PACs FINDINGS: The lungs are mildly hypoinflated but clear. The heart and pulmonary vascularity are normal. The mediastinum is normal in width. There is no pleural effusion. The bony thorax exhibits no acute abnormality. IMPRESSION: There is no active cardiopulmonary disease. Electronically Signed   By: David  [place] M.D.   On: 12/06/2017 14:42   US Abdomen Limited Ruq  Result Date: 12/06/2017 CLINICAL DATA:  Elevated liver enzymes EXAM: ULTRASOUND ABDOMEN LIMITED RIGHT UPPER QUADRANT COMPARISON:  None. FINDINGS: Gallbladder: Poorly visualized, possibly contracted. Thickened appearing wall measuring 3.8 mm. Positive sonographic Murphy. Common bile duct: Diameter: Borderline to slightly enlarged at 6 0.5 mm Liver: Slightly enlarged at 17.6 cm. Coarse  echogenic liver. Portal vein is patent on color Doppler imaging with bidirectional flow. Incidentally noted is small to moderate ascites within the lower quadrants. There is ascites adjacent to the liver and small amount of pericholecystic fluid. IMPRESSION: 1. Difficult visualization of gallbladder. Suspect that there is gallbladder wall thickening up to 3.8 mm and sonographer reports positive sonographic Eulah Pont although no definitive shadowing stones are seen. Acute acalculous cholecystitis could be considered, although gallbladder wall thickening may also be seen in the setting of liver disease. Nuclear medicine study could be helpful to further evaluate. 2. Coarse enlarged echogenic liver consistent with hepatocellular disease and/or fatty infiltration. Bidirectional flow in the portal veins suggests elevated portal pressures. Small to moderate ascites with small fluid adjacent to the gallbladder.  Electronically Signed   By: Jasmine Pang M.D.   On: 12/06/2017 21:24        Scheduled Meds: . enoxaparin (LOVENOX) injection  40 mg Subcutaneous Daily  . levothyroxine  100 mcg Oral QAC breakfast  . methylPREDNISolone (SOLU-MEDROL) injection  60 mg Intravenous Q12H   Continuous Infusions: . sodium chloride    . dextrose 5 % and 0.45% NaCl 125 mL/hr at 12/07/17 0420  . norepinephrine (LEVOPHED) Adult infusion Stopped (12/06/17 1829)  . piperacillin-tazobactam (ZOSYN)  IV Stopped (12/07/17 0524)  . vancomycin Stopped (12/07/17 0800)     LOS: 1 day    Time spent: 35 minutes.     Alba Cory, MD [facility] Pager [phone]  If 7PM-7AM, please contact night-coverage [email or web address] Password [facility] 12/07/2017, 8:32 AM

## 2017-12-07 NOTE — ED Notes (Signed)
[facility] and leaks from site when flushed. Fluids paused for further eval.

## 2017-12-08 ENCOUNTER — Encounter (HOSPITAL_COMMUNITY): Payer: Self-pay

## 2017-12-08 ENCOUNTER — Other Ambulatory Visit: Payer: Self-pay

## 2017-12-08 ENCOUNTER — Inpatient Hospital Stay (HOSPITAL_COMMUNITY): Payer: BLUE CROSS/BLUE SHIELD

## 2017-12-08 DIAGNOSIS — R933 Abnormal findings on diagnostic imaging of other parts of digestive tract: Secondary | ICD-10-CM

## 2017-12-08 DIAGNOSIS — K7010 Alcoholic hepatitis without ascites: Secondary | ICD-10-CM

## 2017-12-08 DIAGNOSIS — I9589 Other hypotension: Secondary | ICD-10-CM

## 2017-12-08 LAB — ECHOCARDIOGRAM COMPLETE
Height: 65 in
WEIGHTICAEL: 2400 [oz_av]

## 2017-12-08 LAB — COMPREHENSIVE METABOLIC PANEL
ALK PHOS: 161 U/L — AB (ref 38–126)
ALT: 56 U/L — ABNORMAL HIGH (ref 14–54)
ANION GAP: 11 (ref 5–15)
AST: 143 U/L — ABNORMAL HIGH (ref 15–41)
Albumin: 1.8 g/dL — ABNORMAL LOW (ref 3.5–5.0)
BILIRUBIN TOTAL: 4.3 mg/dL — AB (ref 0.3–1.2)
BUN: 12 mg/dL (ref 6–20)
CALCIUM: 7.7 mg/dL — AB (ref 8.9–10.3)
CO2: 19 mmol/L — AB (ref 22–32)
Chloride: 108 mmol/L (ref 101–111)
Creatinine, Ser: 0.68 mg/dL (ref 0.44–1.00)
GFR calc non Af Amer: >60 mL/min (ref >60)
Glucose, Bld: 121 mg/dL — ABNORMAL HIGH (ref 65–99)
Potassium: 3.3 mmol/L — ABNORMAL LOW (ref 3.5–5.1)
Sodium: 138 mmol/L (ref 135–145)
TOTAL PROTEIN: 5 g/dL — AB (ref 6.5–8.1)

## 2017-12-08 LAB — TROPONIN I
TROPONIN I: 0.34 ng/mL — AB (ref <0.03)
TROPONIN I: 0.28 ng/mL — AB (ref <0.03)
Troponin I: 0.3 ng/mL (ref <0.03)

## 2017-12-08 LAB — LITHIUM LEVEL: LITHIUM LVL: 1.02 mmol/L (ref 0.60–1.20)

## 2017-12-08 LAB — CBC
HCT: 32.1 % — ABNORMAL LOW (ref 36.0–46.0)
HEMOGLOBIN: 10.6 g/dL — AB (ref 12.0–15.0)
MCH: 37.9 pg — ABNORMAL HIGH (ref 26.0–34.0)
MCHC: 33 g/dL (ref 30.0–36.0)
MCV: 114.6 fL — ABNORMAL HIGH (ref 78.0–100.0)
Platelets: 282 10*3/uL (ref 150–400)
RBC: 2.8 MIL/uL — AB (ref 3.87–5.11)
RDW: 14 % (ref 11.5–15.5)
WBC: 17.2 10*3/uL — ABNORMAL HIGH (ref 4.0–10.5)

## 2017-12-08 LAB — VANCOMYCIN, TROUGH: VANCOMYCIN TR: 13 ug/mL — AB (ref 15–20)

## 2017-12-08 LAB — URINE CULTURE: Culture: NO GROWTH

## 2017-12-08 LAB — HEPATITIS PANEL, ACUTE
HCV AB: 0.1 {s_co_ratio} (ref 0.0–0.9)
HEP A IGM: NEGATIVE
HEP B S AG: NEGATIVE
Hep B C IgM: NEGATIVE

## 2017-12-08 LAB — PROTIME-INR
INR: 1.97
PROTHROMBIN TIME: 22.3 s — AB (ref 11.4–15.2)

## 2017-12-08 MED ORDER — VANCOMYCIN HCL IN DEXTROSE 750-5 MG/150ML-% IV SOLN
750.0000 mg | Freq: Two times a day (BID) | INTRAVENOUS | Status: DC
  Administered 2017-12-08 – 2017-12-09 (×3): 750 mg via INTRAVENOUS
  Filled 2017-12-08 (×4): qty 150

## 2017-12-08 MED ORDER — PHYTONADIONE 5 MG PO TABS
10.0000 mg | ORAL_TABLET | Freq: Once | ORAL | Status: AC
  Administered 2017-12-08: 10 mg via ORAL
  Filled 2017-12-08: qty 2

## 2017-12-08 MED ORDER — PNEUMOCOCCAL VAC POLYVALENT 25 MCG/0.5ML IJ INJ
0.5000 mL | INJECTION | INTRAMUSCULAR | Status: DC
  Filled 2017-12-08: qty 0.5

## 2017-12-08 MED ORDER — LACTULOSE 10 GM/15ML PO SOLN
30.0000 g | Freq: Three times a day (TID) | ORAL | Status: DC
  Administered 2017-12-08 – 2017-12-09 (×4): 30 g via ORAL
  Filled 2017-12-08 (×5): qty 45

## 2017-12-08 MED ORDER — INFLUENZA VAC SPLIT QUAD 0.5 ML IM SUSY
0.5000 mL | PREFILLED_SYRINGE | INTRAMUSCULAR | Status: DC
  Filled 2017-12-08: qty 0.5

## 2017-12-08 MED ORDER — POTASSIUM CHLORIDE CRYS ER 20 MEQ PO TBCR
40.0000 meq | EXTENDED_RELEASE_TABLET | Freq: Once | ORAL | Status: AC
  Administered 2017-12-08: 40 meq via ORAL
  Filled 2017-12-08: qty 2

## 2017-12-08 NOTE — Progress Notes (Signed)
Pharmacy Antibiotic Note  Amy Powers is a 47 y.o. female admitted on 12/06/2017 with sepsis.  Pharmacy has been consulted for Zosyn and vancomycin dosing.  Vancomycin trough of 13 drawn ~1.5 hours late this evening. True trough closer to 16, will leave dose as is.    Plan: Continue Zosyn 3.375 gm IV q8h (4 hour infusion) Vancomycin 750mg  IV Q12h Monitor clinical picture, renal function, VT prn F/U C&S, abx deescalation / LOT  Height: 5\' 5"  (165.1 cm) Weight: 150 lb (68 kg) IBW/kg (Calculated) : 57  Temp (24hrs), Avg:98.3 F (36.8 C), Min:97.7 F (36.5 C), Max:98.6 F (37 C)  Recent Labs  Lab 12/06/17 1411 12/06/17 1425 12/07/17 0308 12/08/17 0551 12/08/17 1832  WBC 20.5*  --  18.0* 17.2*  --   CREATININE 0.79  --  0.78 0.68  --   LATICACIDVEN  --  1.82  --   --   --   VANCOTROUGH  --   --   --   --  13*    Estimated Creatinine Clearance: 79.1 mL/min (by C-G formula based on SCr of 0.68 mg/dL).    No Known Allergies  Thank you for allowing pharmacy to be a part of this patient's care.  Sheppard CoilFrank Wilson PharmD., BCPS Clinical Pharmacist 12/08/2017 8:05 PM

## 2017-12-08 NOTE — Progress Notes (Signed)
  Echocardiogram 2D Echocardiogram has been performed.  Amy Powers, Amy Powers 12/08/2017, 12:35 PM

## 2017-12-08 NOTE — Evaluation (Signed)
Clinical/Bedside Swallow Evaluation Patient Details  Name: Amy Powers MRN: [id number] Date of Birth: 17-Apr-1971  Today's Date: 12/08/2017 Time: SLP Start Time (ACUTE ONLY): 0802 SLP Stop Time (ACUTE ONLY): 0818 SLP Time Calculation (min) (ACUTE ONLY): 16 min  Past Medical History: No past medical history on file. Past Surgical History: The histories are not reviewed yet. Please review them in the "History" navigator section and refresh this SmartLink. HPI:  47 year old female with history of schizophrenia found down unresponsive. Found to be hypotensive, mulitifacorial acute encephalopathy, UTI, lithium toxicity. CT neg. CXR no acute findings. Abdominal US gallbladder wall thickening, acute acalculous cholecystitis could be considered, coarse enlarged echogenic liver consistent with hepatocellular disease and/or fatty infiltration.   Assessment / Plan / Recommendation Clinical Impression  Pt alert, following commands with mild-moderate confusion and moderate feeding assist to manipulate utensils, packages, scoop food and direct to oral cavity. Oral and pharyngeal ability appeared within functional limits. One delayed cough perhaps due to decreased cohesion. Mastication and tranist typical. Recommend regular texture, thin liquids, pills with thin and needs assist for manipulating utensil/packages and utensil to direct food to mouth. Prognosis for independence with meals is good once confusion and manual dexterity improve. No ST follow up.   SLP Visit Diagnosis: Dysphagia, unspecified (R13.10)    Aspiration Risk  Mild aspiration risk    Diet Recommendation Regular;Thin liquid   Liquid Administration via: Cup;Straw Medication Administration: Whole meds with liquid Supervision: Staff to assist with self feeding;Full supervision/cueing for compensatory strategies;Patient able to self feed Compensations: Slow rate;Small sips/bites;Minimize environmental distractions Postural Changes: Seated  upright at 90 degrees    Other  Recommendations Oral Care Recommendations: Oral care BID   Follow up Recommendations None      Frequency and Duration            Prognosis        Swallow Study   General HPI: 47 year old female with history of schizophrenia found down unresponsive. Found to be hypotensive, mulitifacorial acute encephalopathy, UTI, lithium toxicity. CT neg. CXR no acute findings. Abdominal US gallbladder wall thickening, acute acalculous cholecystitis could be considered, coarse enlarged echogenic liver consistent with hepatocellular disease and/or fatty infiltration. Type of Study: Bedside Swallow Evaluation Previous Swallow Assessment: (none) Diet Prior to this Study: NPO Temperature Spikes Noted: No Respiratory Status: Room air History of Recent Intubation: No Behavior/Cognition: Alert;Cooperative;Pleasant mood;Confused;Requires cueing Oral Cavity Assessment: Dry Oral Care Completed by SLP: No Oral Cavity - Dentition: Adequate natural dentition Vision: Functional for self-feeding Self-Feeding Abilities: Needs assist;Needs set up Patient Positioning: Upright in bed Baseline Vocal Quality: Normal Volitional Cough: Strong Volitional Swallow: Able to elicit    Oral/Motor/Sensory Function Overall Oral Motor/Sensory Function: Within functional limits   Ice Chips Ice chips: Not tested   Thin Liquid Thin Liquid: Impaired Presentation: Straw;Cup Pharyngeal  Phase Impairments: Cough - Delayed    Nectar Thick Nectar Thick Liquid: Not tested   Honey Thick Honey Thick Liquid: Not tested   Puree Puree: Within functional limits   Solid   GO   Solid: Within functional limits        Royce MacadamiaLitaker, Aliani Caccavale Willis 12/08/2017,8:27 AM  Breck CoonsLisa Willis Lonell FaceLitaker M.Ed [employer] Pager [phone]

## 2017-12-08 NOTE — Progress Notes (Signed)
Amy Powers Demographics:    Amy Powers, is a 47 y.o. female, DOB - [birth year], BJY:[id number]  Admit date - 12/06/2017   Admitting Physician Gery Pray, MD  Outpatient Primary MD for the Amy Powers is Amy Powers, No Pcp Per  LOS - 2   Chief Complaint  Amy Powers presents with  . Altered Mental Status        Subjective:    Bebe Liter today has no fevers, no emesis,  No chest pain,  Mother at bedside   Assessment  & Plan :    Principal Problem:   Hypotension Active Problems:   Altered mental status   Rhabdomyolysis   Schizophrenia (HCC)   Leukocytosis   Hyperkalemia   Elevated INR   1)Acute Encephalopathy-multifactorial etiology including liver dysfunction, alcohol abuse, dehydration, no definite evidence of infection, blood and urine cultures negative, chest x-ray without pneumonia  2) elevated LFTs-AST higher than ALT suspect alcoholic liver disease, acute viral hepatitis profile mononucleosis screen are negative, lactulose as ordered, GI input appreciated, autoimmune hepatitis workup in progress, Amy Powers already on steroids,  3)Possible Sepsis-diagnosis of sepsis not confirmed, leukocytosis probably due to reactive leukocytosis and compounded by steroid use, no definite evidence of infection, blood and urine cultures negative, chest x-ray without pneumonia  4) social/ethics-Amy Powers's mother who lives in [place] plans on moving with Amy Powers post discharge to increase likelihood the Amy Powers will now go back to heavy drinking  Code Status : full   Disposition Plan  : see # 4 above  Consults  : GI   DVT Prophylaxis  : SCD  Lab Results  Component Value Date   PLT 282 12/08/2017    Inpatient Medications  Scheduled Meds: . folic acid  1 mg Intravenous Daily  . [START ON 12/09/2017] Influenza vac split quadrivalent PF  0.5 mL Intramuscular Tomorrow-1000  . lactulose  30 g Oral TID  .  levothyroxine  50 mcg Intravenous Daily  . methylPREDNISolone (SOLU-MEDROL) injection  60 mg Intravenous Q12H  . pantoprazole (PROTONIX) IV  40 mg Intravenous Q12H  . [START ON 12/09/2017] pneumococcal 23 valent vaccine  0.5 mL Intramuscular Tomorrow-1000  . thiamine  100 mg Intravenous Daily   Continuous Infusions: . sodium chloride    . sodium chloride 75 mL/hr at 12/08/17 1741  . piperacillin-tazobactam (ZOSYN)  IV 3.375 g (12/08/17 1958)  .  sodium bicarbonate  infusion 1000 mL 100 mL/hr at 12/08/17 1958  . vancomycin     PRN Meds:.Place/Maintain arterial line **AND** sodium chloride, acetaminophen **OR** acetaminophen, LORazepam, ondansetron **OR** ondansetron (ZOFRAN) IV, polyethylene glycol    Anti-infectives (From admission, onward)   Start     Dose/Rate Route Frequency Ordered Stop   12/08/17 2100  vancomycin (VANCOCIN) IVPB 750 mg/150 ml premix     750 mg 150 mL/hr over 60 Minutes Intravenous Every 12 hours 12/08/17 2007     12/07/17 0500  vancomycin (VANCOCIN) IVPB 750 mg/150 ml premix  Status:  Discontinued     750 mg 150 mL/hr over 60 Minutes Intravenous Every 12 hours 12/06/17 1616 12/08/17 2007   12/07/17 0000  piperacillin-tazobactam (ZOSYN) IVPB 3.375 g     3.375 g 12.5 mL/hr over 240 Minutes Intravenous Every 8 hours 12/06/17 1616     12/06/17 1615  vancomycin (  VANCOCIN) 1,250 mg in sodium chloride 0.9 % 250 mL IVPB     1,250 mg 166.7 mL/hr over 90 Minutes Intravenous  Once 12/06/17 1610 12/06/17 2139   12/06/17 1615  piperacillin-tazobactam (ZOSYN) IVPB 3.375 g     3.375 g 100 mL/hr over 30 Minutes Intravenous  Once 12/06/17 1610 12/06/17 2209        Objective:   Vitals:   12/08/17 0732 12/08/17 1242 12/08/17 1700 12/08/17 1954  BP: 104/83 103/61  109/81  Pulse: 76 82 72 72  Resp: 17 (!) 22 (!) 21 (!) 21  Temp: 98.4 F (36.9 C) 98.6 F (37 C) 98.4 F (36.9 C) 97.9 F (36.6 C)  TempSrc: Axillary Oral Axillary Oral  SpO2: 97% 100% 96% 99%  Weight:       Height:        Wt Readings from Last 3 Encounters:  12/06/17 68 kg (150 lb)     Intake/Output Summary (Last 24 hours) at 12/08/2017 2011 Last data filed at 12/08/2017 1921 Gross per 24 hour  Intake 2325 ml  Output 550 ml  Net 1775 ml     Physical Exam  Gen:- Awake Alert,  In no apparent distress  HEENT:- [place].AT,   Neck-Supple Neck,No JVD,.  Lungs-  CTAB  CV- S1, S2 normal Abd-  +ve B.Sounds, Abd Soft, No tenderness,    Extremity/Skin:-Warm and dry,    Psych-affect is appropriate    Data Review:   Micro Results Recent Results (from the past 240 hour(s))  Culture, blood (Routine X 2) w Reflex to ID Panel     Status: None (Preliminary result)   Collection Time: 12/06/17  8:55 PM  Result Value Ref Range Status   Specimen Description BLOOD A-LINE  Final   Special Requests   Final    BOTTLES DRAWN AEROBIC AND ANAEROBIC Blood Culture adequate volume   Culture   Final    NO GROWTH 1 DAY Performed at [facility], [address] [place], [place] [address]    Report Status PENDING  Incomplete  Culture, blood (Routine X 2) w Reflex to ID Panel     Status: None (Preliminary result)   Collection Time: 12/06/17 11:29 PM  Result Value Ref Range Status   Specimen Description BLOOD CENTRAL LINE  Final   Special Requests   Final    BOTTLES DRAWN AEROBIC AND ANAEROBIC Blood Culture adequate volume   Culture   Final    NO GROWTH 1 DAY Performed at [facility], [address] [place], [place] [address]    Report Status PENDING  Incomplete  Urine Culture     Status: None   Collection Time: 12/07/17 12:41 PM  Result Value Ref Range Status   Specimen Description URINE, RANDOM  Final   Special Requests NONE  Final   Culture   Final    NO GROWTH Performed at [facility], [address] [place], [place] [address]    Report Status 12/08/2017 FINAL  Final    Radiology Reports Ct Head Wo Contrast  Result Date: 12/06/2017 CLINICAL DATA:  Amy Powers found down  today. EXAM: CT HEAD WITHOUT CONTRAST CT CERVICAL SPINE WITHOUT CONTRAST TECHNIQUE: Multidetector CT imaging of the head and cervical spine was performed following the standard protocol without intravenous contrast. Multiplanar CT image reconstructions of the cervical spine were also generated. COMPARISON:  None. FINDINGS: CT HEAD FINDINGS Brain: There is cortical atrophy. No evidence of acute intracranial abnormality including hemorrhage, infarct, mass  lesion, mass effect, midline shift or abnormal extra-axial fluid collection. No hydrocephalus or pneumocephalus. Vascular: No hyperdense vessel or unexpected calcification. Skull: Intact. Sinuses/Orbits: Minimal mucosal thickening right maxillary sinus noted. Small amount of fluid is seen the mastoid air cells bilaterally. Other: None. CT CERVICAL SPINE FINDINGS Alignment: Maintained with straightening of cervical lordosis noted. Skull base and vertebrae: No acute fracture. No primary bone lesion or focal pathologic process. Soft tissues and spinal canal: No prevertebral fluid or swelling. No visible canal hematoma. Disc levels: There is some loss of disc space height and endplate spurring at C4-5 and C5-6. Upper chest: Lung apices clear. Other: None. IMPRESSION: No acute abnormality head or cervical spine. Advanced for age cortical atrophy. Mild degenerative disc disease C4-5 and C5-6. Electronically Signed   By: Drusilla Kanner M.D.   On: 12/06/2017 15:34   Ct Cervical Spine Wo Contrast  Result Date: 12/06/2017 CLINICAL DATA:  Amy Powers found down today. EXAM: CT HEAD WITHOUT CONTRAST CT CERVICAL SPINE WITHOUT CONTRAST TECHNIQUE: Multidetector CT imaging of the head and cervical spine was performed following the standard protocol without intravenous contrast. Multiplanar CT image reconstructions of the cervical spine were also generated. COMPARISON:  None. FINDINGS: CT HEAD FINDINGS Brain: There is cortical atrophy. No evidence of acute intracranial abnormality  including hemorrhage, infarct, mass lesion, mass effect, midline shift or abnormal extra-axial fluid collection. No hydrocephalus or pneumocephalus. Vascular: No hyperdense vessel or unexpected calcification. Skull: Intact. Sinuses/Orbits: Minimal mucosal thickening right maxillary sinus noted. Small amount of fluid is seen the mastoid air cells bilaterally. Other: None. CT CERVICAL SPINE FINDINGS Alignment: Maintained with straightening of cervical lordosis noted. Skull base and vertebrae: No acute fracture. No primary bone lesion or focal pathologic process. Soft tissues and spinal canal: No prevertebral fluid or swelling. No visible canal hematoma. Disc levels: There is some loss of disc space height and endplate spurring at C4-5 and C5-6. Upper chest: Lung apices clear. Other: None. IMPRESSION: No acute abnormality head or cervical spine. Advanced for age cortical atrophy. Mild degenerative disc disease C4-5 and C5-6. Electronically Signed   By: Drusilla Kanner M.D.   On: 12/06/2017 15:34   Dg Chest Portable 1 View  Result Date: 12/06/2017 CLINICAL DATA:  Central line placement. No hx of heart or lung problems. Pt is a nonsmoker. EXAM: PORTABLE CHEST 1 VIEW COMPARISON:  12/06/2017 at 1415 hours FINDINGS: A right internal jugular central venous line has been placed since the prior exam. The tip projects at the caval atrial junction. No pneumothorax. No acute findings in the lungs. No other change from the earlier study. IMPRESSION: Right internal jugular central venous line catheter tip projects at the caval atrial junction. No pneumothorax. Electronically Signed   By: Amie Portland M.D.   On: 12/06/2017 18:59   Dg Chest Portable 1 View  Result Date: 12/06/2017 CLINICAL DATA:  The Amy Powers was found down on the floor today. The Amy Powers reports shortness of breath. No cardiopulmonary history. EXAM: PORTABLE CHEST 1 VIEW COMPARISON:  None in PACs FINDINGS: The lungs are mildly hypoinflated but clear. The  heart and pulmonary vascularity are normal. The mediastinum is normal in width. There is no pleural effusion. The bony thorax exhibits no acute abnormality. IMPRESSION: There is no active cardiopulmonary disease. Electronically Signed   By: David  Swaziland M.D.   On: 12/06/2017 14:42   US Abdomen Limited Ruq  Result Date: 12/06/2017 CLINICAL DATA:  Elevated liver enzymes EXAM: ULTRASOUND ABDOMEN LIMITED RIGHT UPPER QUADRANT COMPARISON:  None. FINDINGS: Gallbladder:  Poorly visualized, possibly contracted. Thickened appearing wall measuring 3.8 mm. Positive sonographic Murphy. Common bile duct: Diameter: Borderline to slightly enlarged at 6 0.5 mm Liver: Slightly enlarged at 17.6 cm. Coarse echogenic liver. Portal vein is patent on color Doppler imaging with bidirectional flow. Incidentally noted is small to moderate ascites within the lower quadrants. There is ascites adjacent to the liver and small amount of pericholecystic fluid. IMPRESSION: 1. Difficult visualization of gallbladder. Suspect that there is gallbladder wall thickening up to 3.8 mm and sonographer reports positive sonographic Eulah Pont although no definitive shadowing stones are seen. Acute acalculous cholecystitis could be considered, although gallbladder wall thickening may also be seen in the setting of liver disease. Nuclear medicine study could be helpful to further evaluate. 2. Coarse enlarged echogenic liver consistent with hepatocellular disease and/or fatty infiltration. Bidirectional flow in the portal veins suggests elevated portal pressures. Small to moderate ascites with small fluid adjacent to the gallbladder. Electronically Signed   By: Jasmine Pang M.D.   On: 12/06/2017 21:24     CBC Recent Labs  Lab 12/06/17 1411 12/07/17 0308 12/08/17 0551  WBC 20.5* 18.0* 17.2*  HGB 11.1* 11.8* 10.6*  HCT 33.0* 35.2* 32.1*  PLT 365 309 282  MCV 113.8* 115.4* 114.6*  MCH 38.3* 38.7* 37.9*  MCHC 33.6 33.5 33.0  RDW 13.7 14.0 14.0    LYMPHSABS 1.8  --   --   MONOABS 1.4*  --   --   EOSABS 0.2  --   --   BASOSABS 0.0  --   --     Chemistries  Recent Labs  Lab 12/06/17 1411 12/07/17 0308 12/07/17 0941 12/08/17 0551  NA 134* 136  --  138  K 5.3* 4.3  --  3.3*  CL 105 110  --  108  CO2 19* 15*  --  19*  GLUCOSE 90 107*  --  121*  BUN 17 15  --  12  CREATININE 0.79 0.78  --  0.68  CALCIUM 8.1* 8.0*  --  7.7*  MG  --   --  2.1  --   AST 235*  --  178* 143*  ALT 54  --  56* 56*  ALKPHOS 165*  --  170* 161*  BILITOT 5.0*  --  4.9* 4.3*   ------------------------------------------------------------------------------------------------------------------ No results for input(s): CHOL, HDL, LDLCALC, TRIG, CHOLHDL, LDLDIRECT in the last 72 hours.  No results found for: HGBA1C ------------------------------------------------------------------------------------------------------------------ Recent Labs    12/07/17 0308  TSH 10.712*   ------------------------------------------------------------------------------------------------------------------ No results for input(s): VITAMINB12, FOLATE, FERRITIN, TIBC, IRON, RETICCTPCT in the last 72 hours.  Coagulation profile Recent Labs  Lab 12/06/17 1411 12/07/17 1717 12/08/17 1832  INR 1.97 2.88 1.97    No results for input(s): DDIMER in the last 72 hours.  Cardiac Enzymes Recent Labs  Lab 12/07/17 0941 12/07/17 2050 12/08/17 0551  CKMB 9.5*  --   --   TROPONINI 0.42* 0.34* 0.28*   ------------------------------------------------------------------------------------------------------------------    Component Value Date/Time   BNP 295.6 (H) 12/06/2017 1416     Shon Hale M.D on 12/08/2017 at 8:11 PM  Between 7am to 7pm - Pager - [phone]  After 7pm go to [email or web address] - password [email or web address]  [facility] -  Office  [phone]   Voice Recognition Reubin Milan dictation system was used to create this note, attempts have been made to correct  errors. Please contact the author with questions and/or clarifications.

## 2017-12-08 NOTE — Progress Notes (Signed)
Daily Rounding Note  12/08/2017, 9:52 AM  LOS: 2 days   SUBJECTIVE:   Chief complaint:     Note SLP bedside swallow eval this AM: confusion and diminished manual dexterity pose some moderate aspiration risk but pt cleared for regular diet/thin liquids.   Has not had a bowel movement yet.  She received lactulose enema once yesterday.  No BMs today and no BMs recorded.  OBJECTIVE:         Vital signs in last 24 hours:    Temp:  [97.7 F (36.5 C)-98.7 F (37.1 C)] 98.4 F (36.9 C) (02/07 0732) Pulse Rate:  [46-151] 76 (02/07 0732) Resp:  [13-31] 17 (02/07 0732) BP: (97-104)/(63-83) 104/83 (02/07 0732) SpO2:  [87 %-100 %] 97 % (02/07 0732) Arterial Line BP: (80-138)/(36-86) 111/66 (02/07 0800)   Filed Weights   12/06/17 1600  Weight: 68 kg (150 lb)   General: Does not look acutely ill.  She is alert. Heart: RRR.  No MRG.  S1, S2 present. Chest: No shortness of breath or cough.  Lungs clear. Abdomen: Active bowel sounds.  Nondistended, nontender. Extremities: No CCE. Neuro/Psych: Alert.  Still has some aphasia and remains confused.  She could not tell me what year it was or where she was or how she came to be in the hospital.  Positive asterixis.  Moving all her limbs.    Intake/Output from previous day: 02/06 0701 - 02/07 0700 In: -  Out: 565 [Urine:565]  Intake/Output this shift: No intake/output data recorded.  Lab Results: Recent Labs    12/06/17 1411 12/07/17 0308 12/08/17 0551  WBC 20.5* 18.0* 17.2*  HGB 11.1* 11.8* 10.6*  HCT 33.0* 35.2* 32.1*  PLT 365 309 282   BMET Recent Labs    12/06/17 1411 12/07/17 0308 12/08/17 0551  NA 134* 136 138  K 5.3* 4.3 3.3*  CL 105 110 108  CO2 19* 15* 19*  GLUCOSE 90 107* 121*  BUN 17 15 12   CREATININE 0.79 0.78 0.68  CALCIUM 8.1* 8.0* 7.7*   LFT Recent Labs    12/06/17 1411 12/07/17 0941 12/08/17 0551  PROT 5.0* 5.5* 5.0*  ALBUMIN 2.0* 2.0* 1.8*    AST 235* 178* 143*  ALT 54 56* 56*  ALKPHOS 165* 170* 161*  BILITOT 5.0* 4.9* 4.3*  BILIDIR  --  2.3*  --   IBILI  --  2.6*  --    PT/INR Recent Labs    12/06/17 1411 12/07/17 1717  LABPROT 22.3* 29.9*  INR 1.97 2.88   Hepatitis Panel Recent Labs    12/07/17 1717  HEPBSAG Negative  HCVAB 0.1  HEPAIGM Negative  HEPBIGM Negative    Studies/Results: Ct Head Wo Contrast  Result Date: 12/06/2017 CLINICAL DATA:  Patient found down today. EXAM: CT HEAD WITHOUT CONTRAST CT CERVICAL SPINE WITHOUT CONTRAST TECHNIQUE: Multidetector CT imaging of the head and cervical spine was performed following the standard protocol without intravenous contrast. Multiplanar CT image reconstructions of the cervical spine were also generated. COMPARISON:  None. FINDINGS: CT HEAD FINDINGS Brain: There is cortical atrophy. No evidence of acute intracranial abnormality including hemorrhage, infarct, mass lesion, mass effect, midline shift or abnormal extra-axial fluid collection. No hydrocephalus or pneumocephalus. Vascular: No hyperdense vessel or unexpected calcification. Skull: Intact. Sinuses/Orbits: Minimal mucosal thickening right maxillary sinus noted. Small amount of fluid is seen the mastoid air cells bilaterally. Other: None. CT CERVICAL SPINE FINDINGS Alignment: Maintained with straightening of cervical lordosis noted.  Skull base and vertebrae: No acute fracture. No primary bone lesion or focal pathologic process. Soft tissues and spinal canal: No prevertebral fluid or swelling. No visible canal hematoma. Disc levels: There is some loss of disc space height and endplate spurring at C4-5 and C5-6. Upper chest: Lung apices clear. Other: None. IMPRESSION: No acute abnormality head or cervical spine. Advanced for age cortical atrophy. Mild degenerative disc disease C4-5 and C5-6. Electronically Signed   By: Drusilla Kannerhomas  Dalessio M.D.   On: 12/06/2017 15:34   Ct Cervical Spine Wo Contrast  Result Date:  12/06/2017 CLINICAL DATA:  Patient found down today. EXAM: CT HEAD WITHOUT CONTRAST CT CERVICAL SPINE WITHOUT CONTRAST TECHNIQUE: Multidetector CT imaging of the head and cervical spine was performed following the standard protocol without intravenous contrast. Multiplanar CT image reconstructions of the cervical spine were also generated. COMPARISON:  None. FINDINGS: CT HEAD FINDINGS Brain: There is cortical atrophy. No evidence of acute intracranial abnormality including hemorrhage, infarct, mass lesion, mass effect, midline shift or abnormal extra-axial fluid collection. No hydrocephalus or pneumocephalus. Vascular: No hyperdense vessel or unexpected calcification. Skull: Intact. Sinuses/Orbits: Minimal mucosal thickening right maxillary sinus noted. Small amount of fluid is seen the mastoid air cells bilaterally. Other: None. CT CERVICAL SPINE FINDINGS Alignment: Maintained with straightening of cervical lordosis noted. Skull base and vertebrae: No acute fracture. No primary bone lesion or focal pathologic process. Soft tissues and spinal canal: No prevertebral fluid or swelling. No visible canal hematoma. Disc levels: There is some loss of disc space height and endplate spurring at C4-5 and C5-6. Upper chest: Lung apices clear. Other: None. IMPRESSION: No acute abnormality head or cervical spine. Advanced for age cortical atrophy. Mild degenerative disc disease C4-5 and C5-6. Electronically Signed   By: Drusilla Kannerhomas  Dalessio M.D.   On: 12/06/2017 15:34   Dg Chest Portable 1 View  Result Date: 12/06/2017 CLINICAL DATA:  Central line placement. No hx of heart or lung problems. Pt is a nonsmoker. EXAM: PORTABLE CHEST 1 VIEW COMPARISON:  12/06/2017 at 1415 hours FINDINGS: A right internal jugular central venous line has been placed since the prior exam. The tip projects at the caval atrial junction. No pneumothorax. No acute findings in the lungs. No other change from the earlier study. IMPRESSION: Right internal  jugular central venous line catheter tip projects at the caval atrial junction. No pneumothorax. Electronically Signed   By: Amie Portlandavid  Ormond M.D.   On: 12/06/2017 18:59   Dg Chest Portable 1 View  Result Date: 12/06/2017 CLINICAL DATA:  The patient was found down on the floor today. The patient reports shortness of breath. No cardiopulmonary history. EXAM: PORTABLE CHEST 1 VIEW COMPARISON:  None in PACs FINDINGS: The lungs are mildly hypoinflated but clear. The heart and pulmonary vascularity are normal. The mediastinum is normal in width. There is no pleural effusion. The bony thorax exhibits no acute abnormality. IMPRESSION: There is no active cardiopulmonary disease. Electronically Signed   By: David  SwazilandJordan M.D.   On: 12/06/2017 14:42   [place] Abdomen Limited Ruq  Result Date: 12/06/2017 CLINICAL DATA:  Elevated liver enzymes EXAM: ULTRASOUND ABDOMEN LIMITED RIGHT UPPER QUADRANT COMPARISON:  None. FINDINGS: Gallbladder: Poorly visualized, possibly contracted. Thickened appearing wall measuring 3.8 mm. Positive sonographic Murphy. Common bile duct: Diameter: Borderline to slightly enlarged at 6 0.5 mm Liver: Slightly enlarged at 17.6 cm. Coarse echogenic liver. Portal vein is patent on color Doppler imaging with bidirectional flow. Incidentally noted is small to moderate ascites within the lower quadrants.  There is ascites adjacent to the liver and small amount of pericholecystic fluid. IMPRESSION: 1. Difficult visualization of gallbladder. Suspect that there is gallbladder wall thickening up to 3.8 mm and sonographer reports positive sonographic Eulah Pont although no definitive shadowing stones are seen. Acute acalculous cholecystitis could be considered, although gallbladder wall thickening may also be seen in the setting of liver disease. Nuclear medicine study could be helpful to further evaluate. 2. Coarse enlarged echogenic liver consistent with hepatocellular disease and/or fatty infiltration. Bidirectional  flow in the portal veins suggests elevated portal pressures. Small to moderate ascites with small fluid adjacent to the gallbladder. Electronically Signed   By: Jasmine Pang M.D.   On: 12/06/2017 21:24    ASSESMENT:   *  Jaundice, acute hepatitis.  Question acute alcoholic hepatitis. CT with ? Cirrhosis and or fatty liver.  ? Shock liver in setting of being down for (how many days?) ETOH hx undetermined, drinks wine and may have drank more heavily since laid off from job in 10/2017.   LFTs Improved.   Acute hepatitis ABC serologies negative.  Mono screen negative.   Pending labs include ANA, smooth muscle antibody, mitochondrial antibody and alpha-1 antitrypsin. Coagulopathy worsening.  Ascites.    *  AMS.  CT head non-acute but + atrophy advanced for age.  tox screen/ETOH level negative.  Unspecified psychiatric disorder but given that she is on lithium, suspect bipolar disorder. Lithium level mildly elevated, ? Lithium toxicity.    *  Rhabdomyolisis after down (for a few days?).  Renal function remains OK.     *  ? Sepsis.  On Solumedrol, Zosyn, vancomycin.   U/A negative for UTI.  CXR negative for PNA, CHF etc. CK trending down with IV fluids.       *  Hypothyroidism.  elevated TSH improved, normalized T4.    *  Elevated Troponin, serial labs ordered.  Suspicion is for demand ischemia.      PLAN   *   Switch to oral lactulose.  Start with high dose of 30 g 3 times daily and titrate once she is having frequent BMs.  *      Although her WBCs remain elevated, I do not think she has an active infection.  No evidence of a UTI.  Chest x-ray unremarkable..  Reconsider the need for antibiotics.  *   Consider neurology consult.    *   Ordered cmet, PT/INR, CBC for the morning   Jennye Moccasin  12/08/2017, 9:52 AM Pager: [phone]

## 2017-12-09 DIAGNOSIS — E871 Hypo-osmolality and hyponatremia: Secondary | ICD-10-CM

## 2017-12-09 DIAGNOSIS — D689 Coagulation defect, unspecified: Secondary | ICD-10-CM

## 2017-12-09 LAB — CBC
HCT: 31 % — ABNORMAL LOW (ref 36.0–46.0)
Hemoglobin: 10.4 g/dL — ABNORMAL LOW (ref 12.0–15.0)
MCH: 38.1 pg — ABNORMAL HIGH (ref 26.0–34.0)
MCHC: 33.5 g/dL (ref 30.0–36.0)
MCV: 113.6 fL — AB (ref 78.0–100.0)
PLATELETS: 235 10*3/uL (ref 150–400)
RBC: 2.73 MIL/uL — ABNORMAL LOW (ref 3.87–5.11)
RDW: 14.1 % (ref 11.5–15.5)
WBC: 17.5 10*3/uL — ABNORMAL HIGH (ref 4.0–10.5)

## 2017-12-09 LAB — COMPREHENSIVE METABOLIC PANEL
ALK PHOS: 180 U/L — AB (ref 38–126)
ALT: 65 U/L — ABNORMAL HIGH (ref 14–54)
ANION GAP: 13 (ref 5–15)
AST: 174 U/L — ABNORMAL HIGH (ref 15–41)
Albumin: 2.2 g/dL — ABNORMAL LOW (ref 3.5–5.0)
BUN: 11 mg/dL (ref 6–20)
CALCIUM: 7.9 mg/dL — AB (ref 8.9–10.3)
CO2: 21 mmol/L — AB (ref 22–32)
CREATININE: 0.72 mg/dL (ref 0.44–1.00)
Chloride: 101 mmol/L (ref 101–111)
Glucose, Bld: 111 mg/dL — ABNORMAL HIGH (ref 65–99)
Potassium: 5.8 mmol/L — ABNORMAL HIGH (ref 3.5–5.1)
SODIUM: 135 mmol/L (ref 135–145)
Total Bilirubin: 5.1 mg/dL — ABNORMAL HIGH (ref 0.3–1.2)
Total Protein: 5.7 g/dL — ABNORMAL LOW (ref 6.5–8.1)

## 2017-12-09 LAB — CERULOPLASMIN: CERULOPLASMIN: 17.2 mg/dL — AB (ref 19.0–39.0)

## 2017-12-09 LAB — LITHIUM LEVEL: LITHIUM LVL: 0.78 mmol/L (ref 0.60–1.20)

## 2017-12-09 LAB — ALPHA-1-ANTITRYPSIN: A-1 Antitrypsin, Ser: 128 mg/dL (ref 90–200)

## 2017-12-09 LAB — AMMONIA: AMMONIA: 70 umol/L — AB (ref 9–35)

## 2017-12-09 LAB — PROTIME-INR
INR: 1.99
Prothrombin Time: 22.5 seconds — ABNORMAL HIGH (ref 11.4–15.2)

## 2017-12-09 MED ORDER — FUROSEMIDE 10 MG/ML IJ SOLN
40.0000 mg | Freq: Once | INTRAMUSCULAR | Status: AC
Start: 2017-12-09 — End: 2017-12-09
  Administered 2017-12-09: 40 mg via INTRAVENOUS
  Filled 2017-12-09: qty 4

## 2017-12-09 MED ORDER — PHYTONADIONE 5 MG PO TABS
5.0000 mg | ORAL_TABLET | Freq: Once | ORAL | Status: AC
  Administered 2017-12-09: 5 mg via ORAL
  Filled 2017-12-09: qty 1

## 2017-12-09 NOTE — Progress Notes (Signed)
Pt removed right radial a-line. Pressure applied and dressing placed. MD notified.   Berdine DanceLauren Moffitt BSN, RN

## 2017-12-09 NOTE — Progress Notes (Signed)
Daily Rounding Note  12/09/2017, 8:24 AM  LOS: 3 days   SUBJECTIVE:   Chief complaint: remains confused 2 large BMs overnight and 1 This AM.  No pain, no nausea.  Eating but only about 25% of trays.  Urin output  Recorded at 20 ml yesterday, 1400 ml today.    OBJECTIVE:         Vital signs in last 24 hours:    Temp:  [97.9 F (36.6 C)-98.6 F (37 C)] 98.4 F (36.9 C) (02/08 0405) Pulse Rate:  [57-85] 85 (02/08 0432) Resp:  [18-27] 18 (02/08 0432) BP: (93-113)/(61-82) 113/82 (02/08 0432) SpO2:  [92 %-100 %] 98 % (02/08 0432) Arterial Line BP: (103-115)/(66-73) 115/73 (02/07 1700) Last BM Date: 12/09/17 Filed Weights   12/06/17 1600  Weight: 68 kg (150 lb)   General: sleeping, awakened easily.  Slight scleral icterus.  Not ill looking   Heart: RRR Chest: clear bil.  No dyspnea or cough Abdomen: soft, NT, ND.  Some tinkling/tympanitic BS.    Extremities: non-pitting edema in UE Neuro/Psych:  Oriented to self but not to place, year.  Follows commands, requires some extra instructions to perform simple tasks.  No asterixis today.    Intake/Output from previous day: 02/07 0701 - 02/08 0700 In: 2325 [P.O.:350; I.V.:1925; IV Piggyback:50] Out: 200 [Urine:200]  Intake/Output this shift: Total I/O In: -  Out: 1400 [Urine:1400]  Lab Results: Recent Labs    12/07/17 0308 12/08/17 0551 12/09/17 0354  WBC 18.0* 17.2* 17.5*  HGB 11.8* 10.6* 10.4*  HCT 35.2* 32.1* 31.0*  PLT 309 282 235   BMET Recent Labs    12/07/17 0308 12/08/17 0551 12/09/17 0546  NA 136 138 135  K 4.3 3.3* 5.8*  CL 110 108 101  CO2 15* 19* 21*  GLUCOSE 107* 121* 111*  BUN 15 12 11   CREATININE 0.78 0.68 0.72  CALCIUM 8.0* 7.7* 7.9*   LFT Recent Labs    12/07/17 0941 12/08/17 0551 12/09/17 0546  PROT 5.5* 5.0* 5.7*  ALBUMIN 2.0* 1.8* 2.2*  AST 178* 143* 174*  ALT 56* 56* 65*  ALKPHOS 170* 161* 180*  BILITOT 4.9* 4.3* 5.1*    BILIDIR 2.3*  --   --   IBILI 2.6*  --   --    PT/INR Recent Labs    12/08/17 1832 12/09/17 0354  LABPROT 22.3* 22.5*  INR 1.97 1.99   Hepatitis Panel Recent Labs    12/07/17 1717  HEPBSAG Negative  HCVAB 0.1  HEPAIGM Negative  HEPBIGM Negative    Studies/Results: No results found.   Scheduled Meds: . folic acid  1 mg Intravenous Daily  . Influenza vac split quadrivalent PF  0.5 mL Intramuscular Tomorrow-1000  . lactulose  30 g Oral TID  . levothyroxine  50 mcg Intravenous Daily  . methylPREDNISolone (SOLU-MEDROL) injection  60 mg Intravenous Q12H  . pantoprazole (PROTONIX) IV  40 mg Intravenous Q12H  . pneumococcal 23 valent vaccine  0.5 mL Intramuscular Tomorrow-1000  . thiamine  100 mg Intravenous Daily   Continuous Infusions: . sodium chloride    . piperacillin-tazobactam (ZOSYN)  IV 3.375 g (12/09/17 0408)  .  sodium bicarbonate  infusion 1000 mL 50 mL/hr at 12/09/17 0650  . vancomycin Stopped (12/08/17 2239)   PRN Meds:.Place/Maintain arterial line **AND** sodium chloride, acetaminophen **OR** acetaminophen, LORazepam, ondansetron **OR** ondansetron (ZOFRAN) IV, polyethylene glycol   ASSESMENT:   *  Acute, likely ETOH, hepatitis. Discriminant Fx  score today 53.    Cirrhosis vs fatty liver per CT. Hep BC serologies neg. Alpha 1 AT negative. Ceruloplasmin low at 17.2.  Multiple other labs Pending. LFTs fluctuating, up today.  IV Prednisolone in place.    *  Coagulopathy. Increased but improved from high of 2/6.     *  AMS.  ? How much is HE.  On large doses TID Lactulose.  *  Rhabdo after found down at home.  Renal fx never compromised.     *  Hyperkalemia.    *  Sepsis criteria +, on Zosyn Vanc.  WBCs remain elevated.   Neg urine and CXR, neg blood clx to date.  Skeptical there is bacterial infection   PLAN   *  Continue supportive care.      Jennye Moccasin  12/09/2017, 8:24 AM Pager: [phone]

## 2017-12-09 NOTE — Progress Notes (Signed)
Amy Powers Demographics:    Amy Powers, is a 47 y.o. female, DOB - 01/01/1971, UJW:[id number]  Admit date - 12/06/2017   Admitting Physician Gery Pray, MD  Outpatient Primary MD for the Amy Powers is Amy Powers, Amy Powers  LOS - 3   Chief Complaint  Amy Powers presents with  . Altered Mental Status        Subjective:    Amy Powers today has Amy fevers, Amy emesis,  Amy chest pain,  Mother at bedside   Assessment  & Plan :    Principal Problem:   Hypotension Active Problems:   Altered mental status   Rhabdomyolysis   Schizophrenia (HCC)   Leukocytosis   Hyperkalemia   Elevated INR  Brief Summary:- 47 year old white female with past medical history relevant for schizophrenia and alcohol abuse admitted on 12/06/2016 after being found down by the [facility] in her apartment (he has not been seen or heard from 3 days prior to being found).  Amy Powers was hypotensive, tremulous and very disoriented with elevated LFTs, INR and CKs.    Plan:- 1)Acute Encephalopathy-multifactorial etiology including liver dysfunction, alcohol abuse, dehydration, Amy definite evidence of infection, blood and urine cultures negative, chest x-ray without pneumonia, continue lactulose, c/n benzos for alcohol withdrawal  2)Elevated LFTs-AST higher than ALT suspect alcoholic liver disease, acute viral hepatitis profile mononucleosis screen are negative, lactulose as ordered, GI input appreciated, autoimmune hepatitis workup in progress, Amy Powers already on steroids, Tylenol level not elevated  3)Possible Sepsis-diagnosis of sepsis not confirmed, leukocytosis probably due to reactive leukocytosis and compounded by steroid use, Amy definite evidence of infection, blood and urine cultures negative, chest x-ray without pneumonia  4) social/ethics-Amy Powers's mother who lives in [place] plans on moving with Amy Powers post discharge to increase  likelihood the Amy Powers will now go back to heavy drinking  Code Status : full   Disposition Plan  : see # 4 above  Consults  : GI   DVT Prophylaxis  : SCD  Lab Results  Component Value Date   PLT 235 12/09/2017    Inpatient Medications  Scheduled Meds: . folic acid  1 mg Intravenous Daily  . Influenza vac split quadrivalent PF  0.5 mL Intramuscular Tomorrow-1000  . lactulose  30 g Oral TID  . levothyroxine  50 mcg Intravenous Daily  . methylPREDNISolone (SOLU-MEDROL) injection  60 mg Intravenous Q12H  . pantoprazole (PROTONIX) IV  40 mg Intravenous Q12H  . pneumococcal 23 valent vaccine  0.5 mL Intramuscular Tomorrow-1000  . thiamine  100 mg Intravenous Daily   Continuous Infusions: . sodium chloride    . piperacillin-tazobactam (ZOSYN)  IV Stopped (12/09/17 1605)  .  sodium bicarbonate  infusion 1000 mL 50 mL/hr at 12/09/17 0650  . vancomycin Stopped (12/09/17 0953)   PRN Meds:.Place/Maintain arterial line **AND** sodium chloride, acetaminophen **OR** acetaminophen, LORazepam, ondansetron **OR** ondansetron (ZOFRAN) IV, polyethylene glycol    Anti-infectives (From admission, onward)   Start     Dose/Rate Route Frequency Ordered Stop   12/08/17 2100  vancomycin (VANCOCIN) IVPB 750 mg/150 ml premix     750 mg 150 mL/hr over 60 Minutes Intravenous Every 12 hours 12/08/17 2007     12/07/17 0500  vancomycin (VANCOCIN) IVPB 750 mg/150 ml premix  Status:  Discontinued  750 mg 150 mL/hr over 60 Minutes Intravenous Every 12 hours 12/06/17 1616 12/08/17 2007   12/07/17 0000  piperacillin-tazobactam (ZOSYN) IVPB 3.375 g     3.375 g 12.5 mL/hr over 240 Minutes Intravenous Every 8 hours 12/06/17 1616     12/06/17 1615  vancomycin (VANCOCIN) 1,250 mg in sodium chloride 0.9 % 250 mL IVPB     1,250 mg 166.7 mL/hr over 90 Minutes Intravenous  Once 12/06/17 1610 12/06/17 2139   12/06/17 1615  piperacillin-tazobactam (ZOSYN) IVPB 3.375 g     3.375 g 100 mL/hr over 30 Minutes  Intravenous  Once 12/06/17 1610 12/06/17 2209        Objective:   Vitals:   12/09/17 0432 12/09/17 1207 12/09/17 1604 12/09/17 1838  BP: 113/82 109/73 (!) 122/95 (!) 118/96  Pulse: 85 61 85 (!) 118  Resp: 18 14 (!) 21   Temp:  99 F (37.2 C) 99.1 F (37.3 C)   TempSrc:   Axillary   SpO2: 98% 95% 97%   Weight:      Height:        Wt Readings from Last 3 Encounters:  12/06/17 68 kg (150 lb)     Intake/Output Summary (Last 24 hours) at 12/09/2017 1905 Last data filed at 12/09/2017 1605 Gross Powers 24 hour  Intake 120 ml  Output 1701 ml  Net -1581 ml     Physical Exam  Gen:-Sleepy after Ativan  HEENT:- [place].AT,   Neck-Supple Neck,Amy JVD,.  Lungs-  CTAB  CV- S1, S2 normal Abd-  +ve B.Sounds, Abd Soft, Amy tenderness,    Extremity/Skin:-Warm and dry,    Psych-lethargic  Neuro-tremors   Data Review:   Micro Results Recent Results (from the past 240 hour(s))  Culture, blood (Routine X 2) w Reflex to ID Panel     Status: None (Preliminary result)   Collection Time: 12/06/17  8:55 PM  Result Value Ref Range Status   Specimen Description BLOOD A-LINE  Final   Special Requests   Final    BOTTLES DRAWN AEROBIC AND ANAEROBIC Blood Culture adequate volume   Culture   Final    Amy GROWTH 2 DAYS Performed at [facility], [address] [place], [place] [address]    Report Status PENDING  Incomplete  Culture, blood (Routine X 2) w Reflex to ID Panel     Status: None (Preliminary result)   Collection Time: 12/06/17 11:29 PM  Result Value Ref Range Status   Specimen Description BLOOD CENTRAL LINE  Final   Special Requests   Final    BOTTLES DRAWN AEROBIC AND ANAEROBIC Blood Culture adequate volume   Culture   Final    Amy GROWTH 2 DAYS Performed at [facility], [address] [place], [place] [address]    Report Status PENDING  Incomplete  Urine Culture     Status: None   Collection Time: 12/07/17 12:41 PM  Result Value Ref Range Status   Specimen  Description URINE, RANDOM  Final   Special Requests NONE  Final   Culture   Final    Amy GROWTH Performed at [facility], [address] [place], [place] [address]    Report Status 12/08/2017 FINAL  Final    Radiology Reports Ct Head Wo Contrast  Result Date: 12/06/2017 CLINICAL DATA:  Amy Powers found down today. EXAM: CT HEAD WITHOUT CONTRAST CT CERVICAL SPINE WITHOUT CONTRAST TECHNIQUE: Multidetector CT imaging of the head and cervical spine was performed following  the standard protocol without intravenous contrast. Multiplanar CT image reconstructions of the cervical spine were also generated. COMPARISON:  None. FINDINGS: CT HEAD FINDINGS Brain: There is cortical atrophy. Amy evidence of acute intracranial abnormality including hemorrhage, infarct, mass lesion, mass effect, midline shift or abnormal extra-axial fluid collection. Amy hydrocephalus or pneumocephalus. Vascular: Amy hyperdense vessel or unexpected calcification. Skull: Intact. Sinuses/Orbits: Minimal mucosal thickening right maxillary sinus noted. Small amount of fluid is seen the mastoid air cells bilaterally. Other: None. CT CERVICAL SPINE FINDINGS Alignment: Maintained with straightening of cervical lordosis noted. Skull base and vertebrae: Amy acute fracture. Amy primary bone lesion or focal pathologic process. Soft tissues and spinal canal: Amy prevertebral fluid or swelling. Amy visible canal hematoma. Disc levels: There is some loss of disc space height and endplate spurring at C4-5 and C5-6. Upper chest: Lung apices clear. Other: None. IMPRESSION: Amy acute abnormality head or cervical spine. Advanced for age cortical atrophy. Mild degenerative disc disease C4-5 and C5-6. Electronically Signed   By: Drusilla Kanner M.D.   On: 12/06/2017 15:34   Ct Cervical Spine Wo Contrast  Result Date: 12/06/2017 CLINICAL DATA:  Amy Powers found down today. EXAM: CT HEAD WITHOUT CONTRAST CT CERVICAL SPINE WITHOUT CONTRAST TECHNIQUE: Multidetector  CT imaging of the head and cervical spine was performed following the standard protocol without intravenous contrast. Multiplanar CT image reconstructions of the cervical spine were also generated. COMPARISON:  None. FINDINGS: CT HEAD FINDINGS Brain: There is cortical atrophy. Amy evidence of acute intracranial abnormality including hemorrhage, infarct, mass lesion, mass effect, midline shift or abnormal extra-axial fluid collection. Amy hydrocephalus or pneumocephalus. Vascular: Amy hyperdense vessel or unexpected calcification. Skull: Intact. Sinuses/Orbits: Minimal mucosal thickening right maxillary sinus noted. Small amount of fluid is seen the mastoid air cells bilaterally. Other: None. CT CERVICAL SPINE FINDINGS Alignment: Maintained with straightening of cervical lordosis noted. Skull base and vertebrae: Amy acute fracture. Amy primary bone lesion or focal pathologic process. Soft tissues and spinal canal: Amy prevertebral fluid or swelling. Amy visible canal hematoma. Disc levels: There is some loss of disc space height and endplate spurring at C4-5 and C5-6. Upper chest: Lung apices clear. Other: None. IMPRESSION: Amy acute abnormality head or cervical spine. Advanced for age cortical atrophy. Mild degenerative disc disease C4-5 and C5-6. Electronically Signed   By: Drusilla Kanner M.D.   On: 12/06/2017 15:34   Dg Chest Portable 1 View  Result Date: 12/06/2017 CLINICAL DATA:  Central line placement. Amy hx of heart or lung problems. Pt is a nonsmoker. EXAM: PORTABLE CHEST 1 VIEW COMPARISON:  12/06/2017 at 1415 hours FINDINGS: A right internal jugular central venous line has been placed since the prior exam. The tip projects at the caval atrial junction. Amy pneumothorax. Amy acute findings in the lungs. Amy other change from the earlier study. IMPRESSION: Right internal jugular central venous line catheter tip projects at the caval atrial junction. Amy pneumothorax. Electronically Signed   By: Amie Portland M.D.    On: 12/06/2017 18:59   Dg Chest Portable 1 View  Result Date: 12/06/2017 CLINICAL DATA:  The Amy Powers was found down on the floor today. The Amy Powers reports shortness of breath. Amy cardiopulmonary history. EXAM: PORTABLE CHEST 1 VIEW COMPARISON:  None in PACs FINDINGS: The lungs are mildly hypoinflated but clear. The heart and pulmonary vascularity are normal. The mediastinum is normal in width. There is Amy pleural effusion. The bony thorax exhibits Amy acute abnormality. IMPRESSION: There is Amy active cardiopulmonary disease. Electronically Signed  By: David  [place] M.D.   On: 12/06/2017 14:42   US Abdomen Limited Ruq  Result Date: 12/06/2017 CLINICAL DATA:  Elevated liver enzymes EXAM: ULTRASOUND ABDOMEN LIMITED RIGHT UPPER QUADRANT COMPARISON:  None. FINDINGS: Gallbladder: Poorly visualized, possibly contracted. Thickened appearing wall measuring 3.8 mm. Positive sonographic Murphy. Common bile duct: Diameter: Borderline to slightly enlarged at 6 0.5 mm Liver: Slightly enlarged at 17.6 cm. Coarse echogenic liver. Portal vein is patent on color Doppler imaging with bidirectional flow. Incidentally noted is small to moderate ascites within the lower quadrants. There is ascites adjacent to the liver and small amount of pericholecystic fluid. IMPRESSION: 1. Difficult visualization of gallbladder. Suspect that there is gallbladder wall thickening up to 3.8 mm and sonographer reports positive sonographic Amy Powers although Amy definitive shadowing stones are seen. Acute acalculous cholecystitis could be considered, although gallbladder wall thickening may also be seen in the setting of liver disease. Nuclear medicine study could be helpful to further evaluate. 2. Coarse enlarged echogenic liver consistent with hepatocellular disease and/or fatty infiltration. Bidirectional flow in the portal veins suggests elevated portal pressures. Small to moderate ascites with small fluid adjacent to the gallbladder. Electronically  Signed   By: Jasmine Pang M.D.   On: 12/06/2017 21:24     CBC Recent Labs  Lab 12/06/17 1411 12/07/17 0308 12/08/17 0551 12/09/17 0354  WBC 20.5* 18.0* 17.2* 17.5*  HGB 11.1* 11.8* 10.6* 10.4*  HCT 33.0* 35.2* 32.1* 31.0*  PLT 365 309 282 235  MCV 113.8* 115.4* 114.6* 113.6*  MCH 38.3* 38.7* 37.9* 38.1*  MCHC 33.6 33.5 33.0 33.5  RDW 13.7 14.0 14.0 14.1  LYMPHSABS 1.8  --   --   --   MONOABS 1.4*  --   --   --   EOSABS 0.2  --   --   --   BASOSABS 0.0  --   --   --     Chemistries  Recent Labs  Lab 12/06/17 1411 12/07/17 0308 12/07/17 0941 12/08/17 0551 12/09/17 0546  NA 134* 136  --  138 135  K 5.3* 4.3  --  3.3* 5.8*  CL 105 110  --  108 101  CO2 19* 15*  --  19* 21*  GLUCOSE 90 107*  --  121* 111*  BUN 17 15  --  12 11  CREATININE 0.79 0.78  --  0.68 0.72  CALCIUM 8.1* 8.0*  --  7.7* 7.9*  MG  --   --  2.1  --   --   AST 235*  --  178* 143* 174*  ALT 54  --  56* 56* 65*  ALKPHOS 165*  --  170* 161* 180*  BILITOT 5.0*  --  4.9* 4.3* 5.1*   ------------------------------------------------------------------------------------------------------------------ Amy results for input(s): CHOL, HDL, LDLCALC, TRIG, CHOLHDL, LDLDIRECT in the last 72 hours.  Amy results found for: HGBA1C ------------------------------------------------------------------------------------------------------------------ Recent Labs    12/07/17 0308  TSH 10.712*   ------------------------------------------------------------------------------------------------------------------ Amy results for input(s): VITAMINB12, FOLATE, FERRITIN, TIBC, IRON, RETICCTPCT in the last 72 hours.  Coagulation profile Recent Labs  Lab 12/06/17 1411 12/07/17 1717 12/08/17 1832 12/09/17 0354  INR 1.97 2.88 1.97 1.99    Amy results for input(s): DDIMER in the last 72 hours.  Cardiac Enzymes Recent Labs  Lab 12/07/17 0941 12/07/17 2050 12/08/17 0551 12/08/17 1832  CKMB 9.5*  --   --   --   TROPONINI  0.42* 0.34* 0.28* 0.30*   ------------------------------------------------------------------------------------------------------------------    Component Value Date/Time  BNP 295.6 (H) 12/06/2017 1416     Shon Hale M.D on 12/09/2017 at 7:05 PM  Between 7am to 7pm - Pager - [phone]  After 7pm go to [email or web address] - password [email or web address]  [facility] -  Office  [phone]   Voice Recognition Reubin Milan dictation system was used to create this note, attempts have been made to correct errors. Please contact the author with questions and/or clarifications.

## 2017-12-09 NOTE — Progress Notes (Signed)
Patient is NS continuous at 75 and Bicarb continuous at 100. Patient has increased generalized pitting edema and decreased UOP. Provider on called paged. Order received to decrease Bicarb to 50 and NS D/C. Lasix IV ordered.

## 2017-12-10 ENCOUNTER — Inpatient Hospital Stay (HOSPITAL_COMMUNITY): Payer: BLUE CROSS/BLUE SHIELD

## 2017-12-10 DIAGNOSIS — R404 Transient alteration of awareness: Secondary | ICD-10-CM

## 2017-12-10 LAB — ANTI-SMOOTH MUSCLE ANTIBODY, IGG: F-Actin IgG: 7 Units (ref 0–19)

## 2017-12-10 LAB — LITHIUM LEVEL: Lithium Lvl: 0.72 mmol/L (ref 0.60–1.20)

## 2017-12-10 LAB — MITOCHONDRIAL ANTIBODIES: Mitochondrial M2 Ab, IgG: <20 Units (ref 0.0–20.0)

## 2017-12-10 MED ORDER — LACTULOSE 10 GM/15ML PO SOLN
30.0000 g | Freq: Two times a day (BID) | ORAL | Status: DC
  Administered 2017-12-10 – 2017-12-11 (×3): 30 g via ORAL
  Filled 2017-12-10 (×3): qty 45

## 2017-12-10 MED ORDER — PREDNISOLONE 5 MG PO TABS
40.0000 mg | ORAL_TABLET | Freq: Every day | ORAL | Status: DC
  Administered 2017-12-11 – 2017-12-16 (×6): 40 mg via ORAL
  Filled 2017-12-10 (×7): qty 8

## 2017-12-10 MED ORDER — LORAZEPAM 1 MG PO TABS
1.0000 mg | ORAL_TABLET | Freq: Two times a day (BID) | ORAL | Status: DC
  Administered 2017-12-10 – 2017-12-11 (×2): 1 mg via ORAL
  Filled 2017-12-10 (×3): qty 1

## 2017-12-10 NOTE — Progress Notes (Signed)
Daily Rounding Note  12/10/2017, 8:21 AM  LOS: 4 days   SUBJECTIVE:   Chief complaint: confusion continues.    Denies pain, no nausea.  Consuming 25% of her meals multiple loose stools.    OBJECTIVE:         Vital signs in last 24 hours:    Temp:  [98.1 F (36.7 C)-99.3 F (37.4 C)] 98.2 F (36.8 C) (02/09 0507) Pulse Rate:  [61-118] 86 (02/09 0507) Resp:  [12-27] 20 (02/09 0507) BP: (109-184)/(73-163) 184/163 (02/09 0507) SpO2:  [95 %-98 %] 97 % (02/09 0507) Last BM Date: 12/09/17 Filed Weights   12/06/17 1600  Weight: 68 kg (150 lb)   General: sleeping quietly, briefly awakened for exam   Heart: RRR Chest: clear bil.   Abdomen: soft, NT, ND.  Active BS  Extremities: + UE, LE edema.   Neuro/Psych:  Confused, slow mentation.  Moves all limbs.  Protective mittens in place.    Intake/Output from previous day: 02/08 0701 - 02/09 0700 In: 820 [P.O.:120; IV Piggyback:700] Out: 1501 [Urine:1500; Stool:1]  Intake/Output this shift: No intake/output data recorded.  Lab Results: Recent Labs    12/08/17 0551 12/09/17 0354  WBC 17.2* 17.5*  HGB 10.6* 10.4*  HCT 32.1* 31.0*  PLT 282 235   BMET Recent Labs    12/08/17 0551 12/09/17 0546  NA 138 135  K 3.3* 5.8*  CL 108 101  CO2 19* 21*  GLUCOSE 121* 111*  BUN 12 11  CREATININE 0.68 0.72  CALCIUM 7.7* 7.9*   LFT Recent Labs    12/07/17 0941 12/08/17 0551 12/09/17 0546  PROT 5.5* 5.0* 5.7*  ALBUMIN 2.0* 1.8* 2.2*  AST 178* 143* 174*  ALT 56* 56* 65*  ALKPHOS 170* 161* 180*  BILITOT 4.9* 4.3* 5.1*  BILIDIR 2.3*  --   --   IBILI 2.6*  --   --    PT/INR Recent Labs    12/08/17 1832 12/09/17 0354  LABPROT 22.3* 22.5*  INR 1.97 1.99   Hepatitis Panel Recent Labs    12/07/17 1717  HEPBSAG Negative  HCVAB 0.1  HEPAIGM Negative  HEPBIGM Negative    Studies/Results: No results found.   Scheduled Meds: . folic acid  1 mg  Intravenous Daily  . Influenza vac split quadrivalent PF  0.5 mL Intramuscular Tomorrow-1000  . lactulose  30 g Oral TID  . levothyroxine  50 mcg Intravenous Daily  . methylPREDNISolone (SOLU-MEDROL) injection  60 mg Intravenous Q12H  . pantoprazole (PROTONIX) IV  40 mg Intravenous Q12H  . pneumococcal 23 valent vaccine  0.5 mL Intramuscular Tomorrow-1000  . thiamine  100 mg Intravenous Daily   Continuous Infusions: . sodium chloride    . piperacillin-tazobactam (ZOSYN)  IV Stopped (12/10/17 0804)  .  sodium bicarbonate  infusion 1000 mL 50 mL/hr at 12/10/17 0322  . vancomycin Stopped (12/10/17 8413)   PRN Meds:.Place/Maintain arterial line **AND** sodium chloride, acetaminophen **OR** acetaminophen, LORazepam, ondansetron **OR** ondansetron (ZOFRAN) IV, polyethylene glycol   ASSESMENT:   *  Acute, likely ETOH, hepatitis. Discriminant Fx score 2/8 53.   IV Prednisolone in place.   Cirrhosis vs fatty liver per [place], borderline CBD and GB wall thickening MRCP ordered, not completed.  Acute Hep ABC serologies neg. Alpha 1 AT negative. Ceruloplasmin low at 17.2.  Multiple other labs Pending. LFTs fluctuating, up today.    *  Coagulopathy. Stable, overall improved.  PO Vit K 2/7, 2/8.    *  AMS.  ? How much is HE.  On large doses TID Lactulose.  *  Rhabdo after found down at home.  Renal fx never compromised.     *  Hyperkalemia.    *  Sepsis criteria +, on Zosyn Vanc.  WBCs remain elevated but this likely reflects hepatitis.  .   Neg urine and CXR, neg blood clx to date.  Skeptical there is bacterial infection   PLAN   *  CMET, coags, CBC in AM. 24 hour urine copper collection, trying to figure out how to order vs slit lamp exam (will opth do this on inpt?) As no strong evidence of active infection despite meeting sepsis criteria at admission, stopping abx.      Jennye MoccasinSarah Monee Dembeck  12/10/2017, 8:21 AM Pager: [phone]

## 2017-12-10 NOTE — Progress Notes (Signed)
Patient Demographics:    Amy Powers, is a 47 y.o. female, DOB - September 26, 1971, RUE:[id number]  Admit date - 12/06/2017   Admitting Physician Gery Pray, MD  Outpatient Primary MD for the patient is Patient, No Pcp Per  LOS - 4   Chief Complaint  Patient presents with  . Altered Mental Status        Subjective:    Amy Powers today has no fevers, no emesis,  No chest pain,  Mother at bedside , had BMs with lactulose  Assessment  & Plan :    Principal Problem:   Hypotension Active Problems:   Altered mental status   Rhabdomyolysis   Schizophrenia (HCC)   Leukocytosis   Hyperkalemia   Elevated INR  Brief Summary:- 47 year old white female with past medical history relevant for schizophrenia and alcohol abuse admitted on 12/06/2016 after being found down by the [facility] in her apartment (he has not been seen or heard from 3 days prior to being found).  Patient was hypotensive, tremulous and very disoriented with elevated LFTs, INR and CKs.    Plan:- 1)Acute Encephalopathy-multifactorial etiology including liver dysfunction, alcohol abuse, dehydration, no definite evidence of infection, blood and urine cultures negative, chest x-ray without pneumonia, continue lactulose, c/n benzos for alcohol withdrawal.  Neurology consult requested  2)Elevated LFTs-AST higher than ALT suspect alcoholic liver disease, acute viral hepatitis profile mononucleosis screen are negative, lactulose as ordered, GI input appreciated, autoimmune hepatitis workup in progress, patient already on steroids, Tylenol level not elevated, MRCP shows no biliary ductal dilation / obstructive process in regards to her jaundice.  Ceruloplasmin is low, so workup for Wilson disease in progress including 24-hour urine for copper  3)Possible Sepsis-diagnosis of sepsis not confirmed, leukocytosis probably due to reactive leukocytosis and  compounded by steroid use, no definite evidence of infection, blood and urine cultures negative, chest x-ray without pneumonia  4) social/ethics-patient's mother who lives in [place] plans on moving with patient post discharge to increase likelihood the patient will now go back to heavy drinking  5)Hypothyroidism-continue levothyroxine  Code Status : full   Disposition Plan  : see # 4 above  Consults  : GI/Neurology   DVT Prophylaxis  : SCD  Lab Results  Component Value Date   PLT 235 12/09/2017    Inpatient Medications  Scheduled Meds: . folic acid  1 mg Intravenous Daily  . Influenza vac split quadrivalent PF  0.5 mL Intramuscular Tomorrow-1000  . lactulose  30 g Oral BID  . levothyroxine  50 mcg Intravenous Daily  . pneumococcal 23 valent vaccine  0.5 mL Intramuscular Tomorrow-1000  . prednisoLONE  40 mg Oral Daily  . thiamine  100 mg Intravenous Daily   Continuous Infusions: . sodium chloride    .  sodium bicarbonate  infusion 1000 mL 50 mL/hr at 12/10/17 1519   PRN Meds:.Place/Maintain arterial line **AND** sodium chloride, acetaminophen **OR** acetaminophen, LORazepam, ondansetron **OR** ondansetron (ZOFRAN) IV, polyethylene glycol    Anti-infectives (From admission, onward)   Start     Dose/Rate Route Frequency Ordered Stop   12/08/17 2100  vancomycin (VANCOCIN) IVPB 750 mg/150 ml premix  Status:  Discontinued     750 mg 150 mL/hr over 60 Minutes Intravenous Every 12 hours 12/08/17 2007  12/10/17 0830   12/07/17 0500  vancomycin (VANCOCIN) IVPB 750 mg/150 ml premix  Status:  Discontinued     750 mg 150 mL/hr over 60 Minutes Intravenous Every 12 hours 12/06/17 1616 12/08/17 2007   12/07/17 0000  piperacillin-tazobactam (ZOSYN) IVPB 3.375 g  Status:  Discontinued     3.375 g 12.5 mL/hr over 240 Minutes Intravenous Every 8 hours 12/06/17 1616 12/10/17 0830   12/06/17 1615  vancomycin (VANCOCIN) 1,250 mg in sodium chloride 0.9 % 250 mL IVPB     1,250 mg 166.7  mL/hr over 90 Minutes Intravenous  Once 12/06/17 1610 12/06/17 2139   12/06/17 1615  piperacillin-tazobactam (ZOSYN) IVPB 3.375 g     3.375 g 100 mL/hr over 30 Minutes Intravenous  Once 12/06/17 1610 12/06/17 2209        Objective:   Vitals:   12/09/17 2006 12/10/17 0032 12/10/17 0507 12/10/17 1340  BP: (!) 141/99 (!) 127/104 (!) 184/163 (!) 84/55  Pulse: (!) 102 (!) 102 86 (!) 57  Resp: (!) 27 12 20 18   Temp: 99.3 F (37.4 C) 98.1 F (36.7 C) 98.2 F (36.8 C)   TempSrc: Axillary Axillary Oral Oral  SpO2: 97% 98% 97% 93%  Weight:      Height:        Wt Readings from Last 3 Encounters:  12/06/17 68 kg (150 lb)     Intake/Output Summary (Last 24 hours) at 12/10/2017 1825 Last data filed at 12/10/2017 1300 Gross per 24 hour  Intake 820 ml  Output -  Net 820 ml     Physical Exam  Gen:-Agitation on and off, lethargic after Ativan HEENT:- [place].AT,   Neck-Supple Neck,No JVD,.  Lungs-  CTAB  CV- S1, S2 normal Abd-  +ve B.Sounds, Abd Soft, No tenderness,    Extremity/Skin:-Warm and dry,    Psych-lethargic , typically anxious when she wakes up Neuro-tremors noted   Data Review:   Micro Results Recent Results (from the past 240 hour(s))  Culture, blood (Routine X 2) w Reflex to ID Panel     Status: None (Preliminary result)   Collection Time: 12/06/17  8:55 PM  Result Value Ref Range Status   Specimen Description BLOOD A-LINE  Final   Special Requests   Final    BOTTLES DRAWN AEROBIC AND ANAEROBIC Blood Culture adequate volume   Culture   Final    NO GROWTH 3 DAYS Performed at [facility], [address] [place], [place] [address]    Report Status PENDING  Incomplete  Culture, blood (Routine X 2) w Reflex to ID Panel     Status: None (Preliminary result)   Collection Time: 12/06/17 11:29 PM  Result Value Ref Range Status   Specimen Description BLOOD CENTRAL LINE  Final   Special Requests   Final    BOTTLES DRAWN AEROBIC AND ANAEROBIC Blood Culture adequate  volume   Culture   Final    NO GROWTH 3 DAYS Performed at [facility], [address] [place], [place] [address]    Report Status PENDING  Incomplete  Urine Culture     Status: None   Collection Time: 12/07/17 12:41 PM  Result Value Ref Range Status   Specimen Description URINE, RANDOM  Final   Special Requests NONE  Final   Culture   Final    NO GROWTH Performed at [facility], [address], [place], [place] [address]    Report Status 12/08/2017 FINAL  Final  Radiology Reports Ct Head Wo Contrast  Result Date: 12/06/2017 CLINICAL DATA:  Patient found down today. EXAM: CT HEAD WITHOUT CONTRAST CT CERVICAL SPINE WITHOUT CONTRAST TECHNIQUE: Multidetector CT imaging of the head and cervical spine was performed following the standard protocol without intravenous contrast. Multiplanar CT image reconstructions of the cervical spine were also generated. COMPARISON:  None. FINDINGS: CT HEAD FINDINGS Brain: There is cortical atrophy. No evidence of acute intracranial abnormality including hemorrhage, infarct, mass lesion, mass effect, midline shift or abnormal extra-axial fluid collection. No hydrocephalus or pneumocephalus. Vascular: No hyperdense vessel or unexpected calcification. Skull: Intact. Sinuses/Orbits: Minimal mucosal thickening right maxillary sinus noted. Small amount of fluid is seen the mastoid air cells bilaterally. Other: None. CT CERVICAL SPINE FINDINGS Alignment: Maintained with straightening of cervical lordosis noted. Skull base and vertebrae: No acute fracture. No primary bone lesion or focal pathologic process. Soft tissues and spinal canal: No prevertebral fluid or swelling. No visible canal hematoma. Disc levels: There is some loss of disc space height and endplate spurring at C4-5 and C5-6. Upper chest: Lung apices clear. Other: None. IMPRESSION: No acute abnormality head or cervical spine. Advanced for age cortical atrophy. Mild degenerative disc disease C4-5  and C5-6. Electronically Signed   By: Drusilla Kanner M.D.   On: 12/06/2017 15:34   Ct Cervical Spine Wo Contrast  Result Date: 12/06/2017 CLINICAL DATA:  Patient found down today. EXAM: CT HEAD WITHOUT CONTRAST CT CERVICAL SPINE WITHOUT CONTRAST TECHNIQUE: Multidetector CT imaging of the head and cervical spine was performed following the standard protocol without intravenous contrast. Multiplanar CT image reconstructions of the cervical spine were also generated. COMPARISON:  None. FINDINGS: CT HEAD FINDINGS Brain: There is cortical atrophy. No evidence of acute intracranial abnormality including hemorrhage, infarct, mass lesion, mass effect, midline shift or abnormal extra-axial fluid collection. No hydrocephalus or pneumocephalus. Vascular: No hyperdense vessel or unexpected calcification. Skull: Intact. Sinuses/Orbits: Minimal mucosal thickening right maxillary sinus noted. Small amount of fluid is seen the mastoid air cells bilaterally. Other: None. CT CERVICAL SPINE FINDINGS Alignment: Maintained with straightening of cervical lordosis noted. Skull base and vertebrae: No acute fracture. No primary bone lesion or focal pathologic process. Soft tissues and spinal canal: No prevertebral fluid or swelling. No visible canal hematoma. Disc levels: There is some loss of disc space height and endplate spurring at C4-5 and C5-6. Upper chest: Lung apices clear. Other: None. IMPRESSION: No acute abnormality head or cervical spine. Advanced for age cortical atrophy. Mild degenerative disc disease C4-5 and C5-6. Electronically Signed   By: Drusilla Kanner M.D.   On: 12/06/2017 15:34   Mr Abdomen Mrcp Wo Contrast  Result Date: 12/10/2017 CLINICAL DATA:  Abnormal liver function tests EXAM: MRI ABDOMEN WITHOUT CONTRAST  (INCLUDING MRCP) TECHNIQUE: Multiplanar multisequence MR imaging of the abdomen was performed. Heavily T2-weighted images of the biliary and pancreatic ducts were obtained, and three-dimensional MRCP  images were rendered by post processing. COMPARISON:  Ultrasound 12/06/2017 FINDINGS: Exam is limited. patient was thrashing about kicking constantly even after additional sedation. Body motion severely limits body MRI examinations. Lower chest:  Lung bases are clear. Hepatobiliary: Liver has a enlarged caudate lobe. The LEFT hepatic lobe appears mildly shrunken. No discrete nodularity. No intrahepatic biliary duct dilatation. Common duct is grossly. Examination is severely limited by respiratory motion as above. There is moderate volume of free fluid along the margin liver cyst in the RIGHT pericolic gutter. Pancreas: Grossly normal Spleen: Normal spleen. Adrenals/urinary tract: Adrenal glands and kidneys are  normal. Stomach/Bowel: Grossly normal Vascular/Lymphatic: Normal caliber.  No clear adenopathy Musculoskeletal: No aggressive osseous lesion IMPRESSION: 1. Severely limited exam. Patient was thrashing and kicking even with sedation. Body motion severely limits MRI body examinations. 2. Moderate volume of free fluid surrounding the liver extending to the RIGHT pericolic gutter as well as the LEFT pericolic gutter. 3. Enlarged caudate lobe. 4. No intrahepatic or extrahepatic duct dilatation identified. 5. Pancreas grossly normal. Electronically Signed   By: Genevive Bi M.D.   On: 12/10/2017 11:54   Dg Chest Portable 1 View  Result Date: 12/06/2017 CLINICAL DATA:  Central line placement. No hx of heart or lung problems. Pt is a nonsmoker. EXAM: PORTABLE CHEST 1 VIEW COMPARISON:  12/06/2017 at 1415 hours FINDINGS: A right internal jugular central venous line has been placed since the prior exam. The tip projects at the caval atrial junction. No pneumothorax. No acute findings in the lungs. No other change from the earlier study. IMPRESSION: Right internal jugular central venous line catheter tip projects at the caval atrial junction. No pneumothorax. Electronically Signed   By: Amie Portland M.D.   On:  12/06/2017 18:59   Dg Chest Portable 1 View  Result Date: 12/06/2017 CLINICAL DATA:  The patient was found down on the floor today. The patient reports shortness of breath. No cardiopulmonary history. EXAM: PORTABLE CHEST 1 VIEW COMPARISON:  None in PACs FINDINGS: The lungs are mildly hypoinflated but clear. The heart and pulmonary vascularity are normal. The mediastinum is normal in width. There is no pleural effusion. The bony thorax exhibits no acute abnormality. IMPRESSION: There is no active cardiopulmonary disease. Electronically Signed   By: David  [place]az[place]and M.D.   On: 12/06/2017 14:42   US Abdomen Limited Ruq  Result Date: 12/06/2017 CLINICAL DATA:  Elevated liver enzymes EXAM: ULTRASOUND ABDOMEN LIMITED RIGHT UPPER QUADRANT COMPARISON:  None. FINDINGS: Gallbladder: Poorly visualized, possibly contracted. Thickened appearing wall measuring 3.8 mm. Positive sonographic Murphy. Common bile duct: Diameter: Borderline to slightly enlarged at 6 0.5 mm Liver: Slightly enlarged at 17.6 cm. Coarse echogenic liver. Portal vein is patent on color Doppler imaging with bidirectional flow. Incidentally noted is small to moderate ascites within the lower quadrants. There is ascites adjacent to the liver and small amount of pericholecystic fluid. IMPRESSION: 1. Difficult visualization of gallbladder. Suspect that there is gallbladder wall thickening up to 3.8 mm and sonographer reports positive sonographic Eulah Pont although no definitive shadowing stones are seen. Acute acalculous cholecystitis could be considered, although gallbladder wall thickening may also be seen in the setting of liver disease. Nuclear medicine study could be helpful to further evaluate. 2. Coarse enlarged echogenic liver consistent with hepatocellular disease and/or fatty infiltration. Bidirectional flow in the portal veins suggests elevated portal pressures. Small to moderate ascites with small fluid adjacent to the gallbladder. Electronically  Signed   By: Jasmine Pang M.D.   On: 12/06/2017 21:24     CBC Recent Labs  Lab 12/06/17 1411 12/07/17 0308 12/08/17 0551 12/09/17 0354  WBC 20.5* 18.0* 17.2* 17.5*  HGB 11.1* 11.8* 10.6* 10.4*  HCT 33.0* 35.2* 32.1* 31.0*  PLT 365 309 282 235  MCV 113.8* 115.4* 114.6* 113.6*  MCH 38.3* 38.7* 37.9* 38.1*  MCHC 33.6 33.5 33.0 33.5  RDW 13.7 14.0 14.0 14.1  LYMPHSABS 1.8  --   --   --   MONOABS 1.4*  --   --   --   EOSABS 0.2  --   --   --   BASOSABS  0.0  --   --   --     Chemistries  Recent Labs  Lab 12/06/17 1411 12/07/17 0308 12/07/17 0941 12/08/17 0551 12/09/17 0546  NA 134* 136  --  138 135  K 5.3* 4.3  --  3.3* 5.8*  CL 105 110  --  108 101  CO2 19* 15*  --  19* 21*  GLUCOSE 90 107*  --  121* 111*  BUN 17 15  --  12 11  CREATININE 0.79 0.78  --  0.68 0.72  CALCIUM 8.1* 8.0*  --  7.7* 7.9*  MG  --   --  2.1  --   --   AST 235*  --  178* 143* 174*  ALT 54  --  56* 56* 65*  ALKPHOS 165*  --  170* 161* 180*  BILITOT 5.0*  --  4.9* 4.3* 5.1*   ------------------------------------------------------------------------------------------------------------------ No results for input(s): CHOL, HDL, LDLCALC, TRIG, CHOLHDL, LDLDIRECT in the last 72 hours.  No results found for: HGBA1C ------------------------------------------------------------------------------------------------------------------ No results for input(s): TSH, T4TOTAL, T3FREE, THYROIDAB in the last 72 hours.  Invalid input(s): FREET3 ------------------------------------------------------------------------------------------------------------------ No results for input(s): VITAMINB12, FOLATE, FERRITIN, TIBC, IRON, RETICCTPCT in the last 72 hours.  Coagulation profile Recent Labs  Lab 12/06/17 1411 12/07/17 1717 12/08/17 1832 12/09/17 0354  INR 1.97 2.88 1.97 1.99    No results for input(s): DDIMER in the last 72 hours.  Cardiac Enzymes Recent Labs  Lab 12/07/17 0941 12/07/17 2050  12/08/17 0551 12/08/17 1832  CKMB 9.5*  --   --   --   TROPONINI 0.42* 0.34* 0.28* 0.30*   ------------------------------------------------------------------------------------------------------------------    Component Value Date/Time   BNP 295.6 (H) 12/06/2017 1416     Keelon Zurn M.D on 12/10/2017 at 6:25 PM  Between 7am to 7pm - Pager - [phone]  After 7pm go to [email or web address] - password [email or web address]  [facility] -  Office  [phone]   Voice Recognition Reubin Milan/Dragon dictation system was used to create this note, attempts have been made to correct errors. Please contact the author with questions and/or clarifications.

## 2017-12-10 NOTE — Progress Notes (Signed)
Pt would not remain through entire exam. Constant kicking and thrashing inside scanner even after added sedation was given.

## 2017-12-10 NOTE — Consult Note (Signed)
Referring Physician: Shon HaleEmokpae, Courage, MD  Chief Complaint: Persistent altered mental status  HPI: Amy Powers is an 47 y.o. female with a past medical history significant for schizophrenia, alcohol abuse was brought to the ER on 12/06/2016 [facility].  The last time she was seen or heard from was 3 days prior to being found.  Is on presentation she was hypotensive tremulous very disoriented with elevated LFTs, INR and elevated CK.  She was admitted for acute encephalopathy, alcoholic withdrawal, hepatitis and rhabdomyolysis -with preserved renal function, sepsis and started on antibiotics.   Acute hepatitis panel within normal limits, patient had elevated LFTs and jaundice placed on lactulose.  She continues to be agitated likely secondary to alcohol withdrawal on top of her encephalopathy.  Nurse at bedside denies any episodes of seizure-like symptoms.  Due to the persistency of her altered mental status, neurology was consulted for further evaluation.  Initial CT of the head show any acute abnormality of the head or spine but did not advanced for age cortical atrophy.  On assessment patient is in bed she is very somnolent and minimally able to participate in neuro assessments.  She does cry out to mild stimuli and per nurse has had variances between sedation and being wild throughout the day.   Past medical history: Hepatitis, hepatic encephalopathy, alcohol abuse, schizophrenia, elevated INR No family history on file. Social History:  reports that she has quit smoking. she has never used smokeless tobacco. She reports that she drinks about 3.6 oz of alcohol per week. She reports that she does not use drugs.  Allergies: No Known Allergies  Medications:  Scheduled: . folic acid  1 mg Intravenous Daily  . Influenza vac split quadrivalent PF  0.5 mL Intramuscular Tomorrow-1000  . lactulose  30 g Oral BID  . levothyroxine  50 mcg Intravenous Daily  .  LORazepam  1 mg Oral BID  . pneumococcal 23 valent vaccine  0.5 mL Intramuscular Tomorrow-1000  . prednisoLONE  40 mg Oral Daily  . thiamine  100 mg Intravenous Daily    ROS: Unable to accurately assess secondary to patient's altered mental status and somnolence  Physical Examination: Blood pressure (!) 84/55, pulse (!) 57, temperature 98.2 F (36.8 C), temperature source Oral, resp. rate 18, height 5\' 5"  (1.651 m), weight 68 kg (150 lb), SpO2 93 %. HEENT-  Normocephalic, scleral icterus with jaundice  cardiovascular- S1-S2 audible, pulses palpable throughout   Lungs-no rhonchi or wheezing noted, no excessive working breathing.  Saturations within normal limits Abdomen- All 4 quadrants palpated and nontender musculoskeletal-no joint tenderness, deformity or swelling Skin-jaundice noted in face up to hairline in the mucous membranes  neurological Examination Mental Status: Patient somnolent in bed easily arousable.  Patient with poor comprehension and decreased mentation and minimally follow commands.  Speech is mildly slurred secondary to sedation  Cranial Nerves: II: Discs flat bilaterally; Visual fields grossly normal,  III,IV, VI: ptosis not present, extra-ocular motions intact bilaterally pupils equal, round, reactive to light and accommodation V,VII: smile symmetric, unable to assess facial sensation accurately due to patient's somnolence VIII: Hearing intact to voice IX,X: uvula rises symmetrically XII: midline tongue extension Motor: Patient does move all extremities spontaneously and minimally to command, unable to accurately assess strength, but appears to be without deficits, patient with mild tremors Tone and bulk:normal tone throughout; no atrophy noted Sensory: Unable to accurately assess secondary to patient's somnolence but does cry out at mild stimuli  Deep Tendon Reflexes: 2+ in biceps and ankles, 3+ and patella bilaterally Plantars: Right: downgoing   Left:  downgoing Cerebellar: Patient with altered mental status and unable to participate Gait: Unable to assess secondary to patient's somnolence   CT HEAD/CERVICAL SPINE  12/06/17 IMPRESSION: No acute abnormality head or cervical spine.  Advanced for age cortical atrophy.  Mild degenerative disc disease C4-5 and C5-6. Assessment: 47 y.o. female ith a past medical history significant for schizophrenia, alcohol abuse was brought to the ER on 12/06/2016 after being found down by the [facility] in her apartment.  The last time she was seen or heard from was 3 days prior to being found.  Patient was admitted with acute encephalopathy, alcoholic withdrawal, hepatitis and rhabdomyolysis -with preserved renal function, sepsis and started on antibiotics.  Despite interventions, patient continues to have altered mental status decreased level of consciousness and oftentimes agitated likely secondary to alcoholic withdrawal Due to the persistency of her altered mental status, neurology was consulted for further evaluation.    1.  Toxic encephalopathy inpatient in alcoholic withdrawal and possibly hepatic encephalopathy. Diff diagnosis seizure.   Patient is somnolent with periods between sedation and being wild.  Vitals remained stable current.  Patient currently on CIWA protocol with ativan.  Recommend repeat MRI of the brain as well as EEG to rule out underlying seizure etiologies 2.  Alcoholic withdrawal  3.  Hepatitis with hepatitis encephalopathy 4.  Schizophrenia  Plan: - MRI Brain - EEG -Continue CIWA protocol with. - Seizure precautions - Frequent Neuro checks - Telemetry monitoring -  Frequent neuro checks   Vivia Ewing DNP Neuro-hospitalist Team [phone] 12/10/2017, 6:34 PM

## 2017-12-11 DIAGNOSIS — F10231 Alcohol dependence with withdrawal delirium: Secondary | ICD-10-CM

## 2017-12-11 LAB — COMPREHENSIVE METABOLIC PANEL
ALT: 67 U/L — AB (ref 14–54)
ALT: 62 U/L — ABNORMAL HIGH (ref 14–54)
ANION GAP: 13 (ref 5–15)
AST: 119 U/L — ABNORMAL HIGH (ref 15–41)
AST: 144 U/L — ABNORMAL HIGH (ref 15–41)
Albumin: 2.1 g/dL — ABNORMAL LOW (ref 3.5–5.0)
Albumin: 2.1 g/dL — ABNORMAL LOW (ref 3.5–5.0)
Alkaline Phosphatase: 146 U/L — ABNORMAL HIGH (ref 38–126)
Alkaline Phosphatase: 141 U/L — ABNORMAL HIGH (ref 38–126)
Anion gap: 11 (ref 5–15)
BILIRUBIN TOTAL: 4.2 mg/dL — AB (ref 0.3–1.2)
BUN: 9 mg/dL (ref 6–20)
BUN: 8 mg/dL (ref 6–20)
CALCIUM: 8 mg/dL — AB (ref 8.9–10.3)
CHLORIDE: 99 mmol/L — AB (ref 101–111)
CHLORIDE: 100 mmol/L — AB (ref 101–111)
CO2: 28 mmol/L (ref 22–32)
CO2: 27 mmol/L (ref 22–32)
CREATININE: 0.65 mg/dL (ref 0.44–1.00)
Calcium: 8.1 mg/dL — ABNORMAL LOW (ref 8.9–10.3)
Creatinine, Ser: 0.66 mg/dL (ref 0.44–1.00)
Glucose, Bld: 85 mg/dL (ref 65–99)
Glucose, Bld: 111 mg/dL — ABNORMAL HIGH (ref 65–99)
Potassium: 2.3 mmol/L — CL (ref 3.5–5.1)
Potassium: 3.9 mmol/L (ref 3.5–5.1)
Sodium: 138 mmol/L (ref 135–145)
Sodium: 140 mmol/L (ref 135–145)
TOTAL PROTEIN: 5.1 g/dL — AB (ref 6.5–8.1)
Total Bilirubin: 4.9 mg/dL — ABNORMAL HIGH (ref 0.3–1.2)
Total Protein: 5.2 g/dL — ABNORMAL LOW (ref 6.5–8.1)

## 2017-12-11 LAB — CBC
HCT: 34.8 % — ABNORMAL LOW (ref 36.0–46.0)
Hemoglobin: 11.6 g/dL — ABNORMAL LOW (ref 12.0–15.0)
MCH: 38.2 pg — ABNORMAL HIGH (ref 26.0–34.0)
MCHC: 33.3 g/dL (ref 30.0–36.0)
MCV: 114.5 fL — AB (ref 78.0–100.0)
PLATELETS: 187 10*3/uL (ref 150–400)
RBC: 3.04 MIL/uL — AB (ref 3.87–5.11)
RDW: 13.9 % (ref 11.5–15.5)
WBC: 23 10*3/uL — AB (ref 4.0–10.5)

## 2017-12-11 LAB — MAGNESIUM: Magnesium: 1.8 mg/dL (ref 1.7–2.4)

## 2017-12-11 LAB — CK: CK TOTAL: 227 U/L (ref 38–234)

## 2017-12-11 LAB — AMMONIA: AMMONIA: 51 umol/L — AB (ref 9–35)

## 2017-12-11 LAB — PROTIME-INR
INR: 1.62
PROTHROMBIN TIME: 19.1 s — AB (ref 11.4–15.2)

## 2017-12-11 LAB — LITHIUM LEVEL: LITHIUM LVL: 0.55 mmol/L — AB (ref 0.60–1.20)

## 2017-12-11 MED ORDER — VITAMIN B-1 100 MG PO TABS
100.0000 mg | ORAL_TABLET | Freq: Every day | ORAL | Status: DC
  Administered 2017-12-12 – 2017-12-16 (×5): 100 mg via ORAL
  Filled 2017-12-11 (×5): qty 1

## 2017-12-11 MED ORDER — HALOPERIDOL LACTATE 5 MG/ML IJ SOLN
5.0000 mg | Freq: Once | INTRAMUSCULAR | Status: AC
  Administered 2017-12-12: 5 mg via INTRAMUSCULAR
  Filled 2017-12-11: qty 1

## 2017-12-11 MED ORDER — HALOPERIDOL LACTATE 5 MG/ML IJ SOLN
5.0000 mg | Freq: Once | INTRAMUSCULAR | Status: AC
  Administered 2017-12-11: 5 mg via INTRAMUSCULAR
  Filled 2017-12-11: qty 1

## 2017-12-11 MED ORDER — FOLIC ACID 1 MG PO TABS
1.0000 mg | ORAL_TABLET | Freq: Every day | ORAL | Status: DC
  Administered 2017-12-12 – 2017-12-16 (×5): 1 mg via ORAL
  Filled 2017-12-11 (×5): qty 1

## 2017-12-11 MED ORDER — POTASSIUM CHLORIDE CRYS ER 20 MEQ PO TBCR
40.0000 meq | EXTENDED_RELEASE_TABLET | Freq: Once | ORAL | Status: AC
  Administered 2017-12-11: 40 meq via ORAL
  Filled 2017-12-11: qty 2

## 2017-12-11 MED ORDER — POTASSIUM CHLORIDE 20 MEQ PO PACK
40.0000 meq | PACK | Freq: Two times a day (BID) | ORAL | Status: DC
  Administered 2017-12-11 – 2017-12-14 (×7): 40 meq via ORAL
  Filled 2017-12-11 (×10): qty 2

## 2017-12-11 MED ORDER — KCL IN DEXTROSE-NACL 40-5-0.45 MEQ/L-%-% IV SOLN
INTRAVENOUS | Status: DC
  Administered 2017-12-11 – 2017-12-13 (×2): via INTRAVENOUS
  Filled 2017-12-11 (×5): qty 1000

## 2017-12-11 MED ORDER — LEVOTHYROXINE SODIUM 100 MCG PO TABS
100.0000 ug | ORAL_TABLET | Freq: Every day | ORAL | Status: DC
  Administered 2017-12-12 – 2017-12-16 (×5): 100 ug via ORAL
  Filled 2017-12-11 (×6): qty 1

## 2017-12-11 NOTE — Progress Notes (Signed)
Gave patient one dose of Haldol IM 8:30 am in anticipation of MRI brain.  12pm, spoke with MRI tech who stated they would call an hour before they were ready for procedure so I could administer second dose of Haldol IM.  As of end of shift, no word yet from MRI.

## 2017-12-11 NOTE — Progress Notes (Signed)
CRITICAL VALUE ALERT  Critical Value:  Potassium 2.3  Date & Time Notied:  12/11/17 at 10:05 am  Provider Notified:Dr Emokpae  Orders Received/Actions taken: new orders as needed

## 2017-12-11 NOTE — Progress Notes (Signed)
Patient Demographics:    Amy Powers, is a 47 y.o. female, DOB - 06-11-1971, ONG:[id number]  Admit date - 12/06/2017   Admitting Physician Gery Pray, MD  Outpatient Primary MD for the patient is Patient, No Pcp Per  LOS - 5   Chief Complaint  Patient presents with  . Altered Mental Status        Subjective:    Bebe Liter today has no fevers, no emesis,  No chest pain, sleepy, took po lactulose Mother at bedside , questions answered  Assessment  & Plan :    Principal Problem:   Hypotension Active Problems:   Altered mental status   Rhabdomyolysis   Schizophrenia (HCC)   Leukocytosis   Hyperkalemia   Elevated INR  Brief Summary:- 47 year old white female with past medical history relevant for schizophrenia and alcohol abuse admitted on 12/06/2016 after being found down by the [facility] in her apartment (she has not been seen or heard from 3 days prior to being found).  Patient was hypotensive, tremulous and very disoriented with elevated LFTs, INR and CKs.    Plan:- 1)Acute Encephalopathy-multifactorial etiology including liver dysfunction, alcohol abuse/DTs, dehydration, lithium toxicity, rhabdomyolysis and  hypothyroidism contributing, no definite evidence of infection, blood and urine cultures negative, chest x-ray without pneumonia, serum ammonia level is down to 51, continue lactulose, c/n benzos for alcohol withdrawal.  Neurology consult appreciated, MRI brain and EEG tests are pending  2)Elevated LFTs-AST higher than ALT suspect alcoholic liver disease, acute viral hepatitis profile and mononucleosis screen are negative, c/n Lactulose ,  GI input appreciated, autoimmune hepatitis workup in progress, c/n prednisone 40 mg daily Tylenol level not elevated, MRCP shows no biliary ductal dilation / obstructive process in regards to her jaundice.  Ceruloplasmin is low, so workup for Wilson disease in  progress including 24-hour urine for copper  3)Possible Sepsis-diagnosis of sepsis not confirmed, leukocytosis probably due to reactive leukocytosis and compounded by steroid use, no definite evidence of infection, blood and urine cultures negative, chest x-ray without pneumonia, WBC > 23 k  4)Social/Ethics-patient's mother who lives in [place] plans on moving with patient post discharge to increase likelihood the patient will now go back to heavy drinking  5)Hypothyroidism-continue iv levothyroxine, free T4 and TSH have trended down  Code Status : full   Disposition Plan  : see # 4 above  Consults  : GI/Neurology   DVT Prophylaxis  : SCD  Lab Results  Component Value Date   PLT 187 12/11/2017    Inpatient Medications  Scheduled Meds: . [START ON 12/12/2017] folic acid  1 mg Oral Daily  . haloperidol lactate  5 mg Intramuscular Once  . Influenza vac split quadrivalent PF  0.5 mL Intramuscular Tomorrow-1000  . lactulose  30 g Oral BID  . [START ON 12/12/2017] levothyroxine  100 mcg Oral QAC breakfast  . LORazepam  1 mg Oral BID  . pneumococcal 23 valent vaccine  0.5 mL Intramuscular Tomorrow-1000  . potassium chloride  40 mEq Oral BID  . potassium chloride  40 mEq Oral Once  . prednisoLONE  40 mg Oral Daily  . [START ON 12/12/2017] thiamine  100 mg Oral Daily   Continuous Infusions: . sodium chloride    . dextrose 5 %  and 0.45 % NaCl with KCl 40 mEq/L 125 mL/hr at 12/11/17 1212   PRN Meds:.Place/Maintain arterial line **AND** sodium chloride, acetaminophen **OR** acetaminophen, LORazepam, ondansetron **OR** ondansetron (ZOFRAN) IV, polyethylene glycol    Anti-infectives (From admission, onward)   Start     Dose/Rate Route Frequency Ordered Stop   12/08/17 2100  vancomycin (VANCOCIN) IVPB 750 mg/150 ml premix  Status:  Discontinued     750 mg 150 mL/hr over 60 Minutes Intravenous Every 12 hours 12/08/17 2007 12/10/17 0830   12/07/17 0500  vancomycin (VANCOCIN) IVPB 750  mg/150 ml premix  Status:  Discontinued     750 mg 150 mL/hr over 60 Minutes Intravenous Every 12 hours 12/06/17 1616 12/08/17 2007   12/07/17 0000  piperacillin-tazobactam (ZOSYN) IVPB 3.375 g  Status:  Discontinued     3.375 g 12.5 mL/hr over 240 Minutes Intravenous Every 8 hours 12/06/17 1616 12/10/17 0830   12/06/17 1615  vancomycin (VANCOCIN) 1,250 mg in sodium chloride 0.9 % 250 mL IVPB     1,250 mg 166.7 mL/hr over 90 Minutes Intravenous  Once 12/06/17 1610 12/06/17 2139   12/06/17 1615  piperacillin-tazobactam (ZOSYN) IVPB 3.375 g     3.375 g 100 mL/hr over 30 Minutes Intravenous  Once 12/06/17 1610 12/06/17 2209        Objective:   Vitals:   12/10/17 2110 12/11/17 0022 12/11/17 0425 12/11/17 1327  BP: (!) 148/62 122/78 136/76 (!) 131/118  Pulse: 92 87 88 95  Resp: 18 20 18 18   Temp: 98.9 F (37.2 C) 98.8 F (37.1 C) 98.1 F (36.7 C) 98.7 F (37.1 C)  TempSrc: Oral Axillary Oral Oral  SpO2: 95% 95% 96% 93%  Weight:      Height:        Wt Readings from Last 3 Encounters:  12/06/17 68 kg (150 lb)     Intake/Output Summary (Last 24 hours) at 12/11/2017 1653 Last data filed at 12/11/2017 1300 Gross per 24 hour  Intake 3625.83 ml  Output 220 ml  Net 3405.83 ml     Physical Exam  Gen:-Agitation on and off alternating with lethargy after sedatives HEENT:- [place].AT,   Neck-Supple Neck,No JVD,.  Lungs-  CTAB  CV- S1, S2 normal Abd-  +ve B.Sounds, Abd Soft, No tenderness,    Extremity/Skin:-Warm and dry,    Psych-  typically anxious when she wakes up, tearful from time to time Neuro-tremors noted, moving all extremities well no new focal deficits   Data Review:   Micro Results Recent Results (from the past 240 hour(s))  Culture, blood (Routine X 2) w Reflex to ID Panel     Status: None (Preliminary result)   Collection Time: 12/06/17  8:55 PM  Result Value Ref Range Status   Specimen Description BLOOD A-LINE  Final   Special Requests   Final    BOTTLES  DRAWN AEROBIC AND ANAEROBIC Blood Culture adequate volume   Culture   Final    NO GROWTH 4 DAYS Performed at [facility], [address] [place], [place] [address]    Report Status PENDING  Incomplete  Culture, blood (Routine X 2) w Reflex to ID Panel     Status: None (Preliminary result)   Collection Time: 12/06/17 11:29 PM  Result Value Ref Range Status   Specimen Description BLOOD CENTRAL LINE  Final   Special Requests   Final    BOTTLES DRAWN AEROBIC AND ANAEROBIC Blood Culture adequate volume   Culture   Final  NO GROWTH 4 DAYS Performed at [facility], [address], [place], [place] [address]    Report Status PENDING  Incomplete  Urine Culture     Status: None   Collection Time: 12/07/17 12:41 PM  Result Value Ref Range Status   Specimen Description URINE, RANDOM  Final   Special Requests NONE  Final   Culture   Final    NO GROWTH Performed at [facility], [address], [place], [place] [address]    Report Status 12/08/2017 FINAL  Final    Radiology Reports Ct Head Wo Contrast  Result Date: 12/06/2017 CLINICAL DATA:  Patient found down today. EXAM: CT HEAD WITHOUT CONTRAST CT CERVICAL SPINE WITHOUT CONTRAST TECHNIQUE: Multidetector CT imaging of the head and cervical spine was performed following the standard protocol without intravenous contrast. Multiplanar CT image reconstructions of the cervical spine were also generated. COMPARISON:  None. FINDINGS: CT HEAD FINDINGS Brain: There is cortical atrophy. No evidence of acute intracranial abnormality including hemorrhage, infarct, mass lesion, mass effect, midline shift or abnormal extra-axial fluid collection. No hydrocephalus or pneumocephalus. Vascular: No hyperdense vessel or unexpected calcification. Skull: Intact. Sinuses/Orbits: Minimal mucosal thickening right maxillary sinus noted. Small amount of fluid is seen the mastoid air cells bilaterally. Other: None. CT CERVICAL SPINE FINDINGS  Alignment: Maintained with straightening of cervical lordosis noted. Skull base and vertebrae: No acute fracture. No primary bone lesion or focal pathologic process. Soft tissues and spinal canal: No prevertebral fluid or swelling. No visible canal hematoma. Disc levels: There is some loss of disc space height and endplate spurring at C4-5 and C5-6. Upper chest: Lung apices clear. Other: None. IMPRESSION: No acute abnormality head or cervical spine. Advanced for age cortical atrophy. Mild degenerative disc disease C4-5 and C5-6. Electronically Signed   By: Drusilla Kanner M.D.   On: 12/06/2017 15:34   Ct Cervical Spine Wo Contrast  Result Date: 12/06/2017 CLINICAL DATA:  Patient found down today. EXAM: CT HEAD WITHOUT CONTRAST CT CERVICAL SPINE WITHOUT CONTRAST TECHNIQUE: Multidetector CT imaging of the head and cervical spine was performed following the standard protocol without intravenous contrast. Multiplanar CT image reconstructions of the cervical spine were also generated. COMPARISON:  None. FINDINGS: CT HEAD FINDINGS Brain: There is cortical atrophy. No evidence of acute intracranial abnormality including hemorrhage, infarct, mass lesion, mass effect, midline shift or abnormal extra-axial fluid collection. No hydrocephalus or pneumocephalus. Vascular: No hyperdense vessel or unexpected calcification. Skull: Intact. Sinuses/Orbits: Minimal mucosal thickening right maxillary sinus noted. Small amount of fluid is seen the mastoid air cells bilaterally. Other: None. CT CERVICAL SPINE FINDINGS Alignment: Maintained with straightening of cervical lordosis noted. Skull base and vertebrae: No acute fracture. No primary bone lesion or focal pathologic process. Soft tissues and spinal canal: No prevertebral fluid or swelling. No visible canal hematoma. Disc levels: There is some loss of disc space height and endplate spurring at C4-5 and C5-6. Upper chest: Lung apices clear. Other: None. IMPRESSION: No acute  abnormality head or cervical spine. Advanced for age cortical atrophy. Mild degenerative disc disease C4-5 and C5-6. Electronically Signed   By: Drusilla Kanner M.D.   On: 12/06/2017 15:34   Mr Abdomen Mrcp Wo Contrast  Result Date: 12/10/2017 CLINICAL DATA:  Abnormal liver function tests EXAM: MRI ABDOMEN WITHOUT CONTRAST  (INCLUDING MRCP) TECHNIQUE: Multiplanar multisequence MR imaging of the abdomen was performed. Heavily T2-weighted images of the biliary and pancreatic ducts were obtained, and three-dimensional MRCP images were rendered by post  processing. COMPARISON:  Ultrasound 12/06/2017 FINDINGS: Exam is limited. patient was thrashing about kicking constantly even after additional sedation. Body motion severely limits body MRI examinations. Lower chest:  Lung bases are clear. Hepatobiliary: Liver has a enlarged caudate lobe. The LEFT hepatic lobe appears mildly shrunken. No discrete nodularity. No intrahepatic biliary duct dilatation. Common duct is grossly. Examination is severely limited by respiratory motion as above. There is moderate volume of free fluid along the margin liver cyst in the RIGHT pericolic gutter. Pancreas: Grossly normal Spleen: Normal spleen. Adrenals/urinary tract: Adrenal glands and kidneys are normal. Stomach/Bowel: Grossly normal Vascular/Lymphatic: Normal caliber.  No clear adenopathy Musculoskeletal: No aggressive osseous lesion IMPRESSION: 1. Severely limited exam. Patient was thrashing and kicking even with sedation. Body motion severely limits MRI body examinations. 2. Moderate volume of free fluid surrounding the liver extending to the RIGHT pericolic gutter as well as the LEFT pericolic gutter. 3. Enlarged caudate lobe. 4. No intrahepatic or extrahepatic duct dilatation identified. 5. Pancreas grossly normal. Electronically Signed   By: Genevive Bi M.D.   On: 12/10/2017 11:54   Dg Chest Portable 1 View  Result Date: 12/06/2017 CLINICAL DATA:  Central line  placement. No hx of heart or lung problems. Pt is a nonsmoker. EXAM: PORTABLE CHEST 1 VIEW COMPARISON:  12/06/2017 at 1415 hours FINDINGS: A right internal jugular central venous line has been placed since the prior exam. The tip projects at the caval atrial junction. No pneumothorax. No acute findings in the lungs. No other change from the earlier study. IMPRESSION: Right internal jugular central venous line catheter tip projects at the caval atrial junction. No pneumothorax. Electronically Signed   By: Amie Portland M.D.   On: 12/06/2017 18:59   Dg Chest Portable 1 View  Result Date: 12/06/2017 CLINICAL DATA:  The patient was found down on the floor today. The patient reports shortness of breath. No cardiopulmonary history. EXAM: PORTABLE CHEST 1 VIEW COMPARISON:  None in PACs FINDINGS: The lungs are mildly hypoinflated but clear. The heart and pulmonary vascularity are normal. The mediastinum is normal in width. There is no pleural effusion. The bony thorax exhibits no acute abnormality. IMPRESSION: There is no active cardiopulmonary disease. Electronically Signed   By: David  Swaziland M.D.   On: 12/06/2017 14:42   US Abdomen Limited Ruq  Result Date: 12/06/2017 CLINICAL DATA:  Elevated liver enzymes EXAM: ULTRASOUND ABDOMEN LIMITED RIGHT UPPER QUADRANT COMPARISON:  None. FINDINGS: Gallbladder: Poorly visualized, possibly contracted. Thickened appearing wall measuring 3.8 mm. Positive sonographic Murphy. Common bile duct: Diameter: Borderline to slightly enlarged at 6 0.5 mm Liver: Slightly enlarged at 17.6 cm. Coarse echogenic liver. Portal vein is patent on color Doppler imaging with bidirectional flow. Incidentally noted is small to moderate ascites within the lower quadrants. There is ascites adjacent to the liver and small amount of pericholecystic fluid. IMPRESSION: 1. Difficult visualization of gallbladder. Suspect that there is gallbladder wall thickening up to 3.8 mm and sonographer reports positive  sonographic Eulah Pont although no definitive shadowing stones are seen. Acute acalculous cholecystitis could be considered, although gallbladder wall thickening may also be seen in the setting of liver disease. Nuclear medicine study could be helpful to further evaluate. 2. Coarse enlarged echogenic liver consistent with hepatocellular disease and/or fatty infiltration. Bidirectional flow in the portal veins suggests elevated portal pressures. Small to moderate ascites with small fluid adjacent to the gallbladder. Electronically Signed   By: Jasmine Pang M.D.   On: 12/06/2017 21:24     CBC  Recent Labs  Lab 12/06/17 1411 12/07/17 0308 12/08/17 0551 12/09/17 0354 12/11/17 0844  WBC 20.5* 18.0* 17.2* 17.5* 23.0*  HGB 11.1* 11.8* 10.6* 10.4* 11.6*  HCT 33.0* 35.2* 32.1* 31.0* 34.8*  PLT 365 309 282 235 187  MCV 113.8* 115.4* 114.6* 113.6* 114.5*  MCH 38.3* 38.7* 37.9* 38.1* 38.2*  MCHC 33.6 33.5 33.0 33.5 33.3  RDW 13.7 14.0 14.0 14.1 13.9  LYMPHSABS 1.8  --   --   --   --   MONOABS 1.4*  --   --   --   --   EOSABS 0.2  --   --   --   --   BASOSABS 0.0  --   --   --   --     Chemistries  Recent Labs  Lab 12/07/17 0308 12/07/17 0941 12/08/17 0551 12/09/17 0546 12/11/17 0844 12/11/17 1032  NA 136  --  138 135 138 140  K 4.3  --  3.3* 5.8* 2.3* 3.9  CL 110  --  108 101 99* 100*  CO2 15*  --  19* 21* 28 27  GLUCOSE 107*  --  121* 111* 85 111*  BUN 15  --  12 11 9 8   CREATININE 0.78  --  0.68 0.72 0.65 0.66  CALCIUM 8.0*  --  7.7* 7.9* 8.0* 8.1*  MG  --  2.1  --   --  1.8  --   AST  --  178* 143* 174* 119* 144*  ALT  --  56* 56* 65* 67* 62*  ALKPHOS  --  170* 161* 180* 146* 141*  BILITOT  --  4.9* 4.3* 5.1* 4.2* 4.9*   ------------------------------------------------------------------------------------------------------------------ No results for input(s): CHOL, HDL, LDLCALC, TRIG, CHOLHDL, LDLDIRECT in the last 72 hours.  No results found for:  HGBA1C ------------------------------------------------------------------------------------------------------------------ No results for input(s): TSH, T4TOTAL, T3FREE, THYROIDAB in the last 72 hours.  Invalid input(s): FREET3 ------------------------------------------------------------------------------------------------------------------ No results for input(s): VITAMINB12, FOLATE, FERRITIN, TIBC, IRON, RETICCTPCT in the last 72 hours.  Coagulation profile Recent Labs  Lab 12/06/17 1411 12/07/17 1717 12/08/17 1832 12/09/17 0354 12/11/17 0844  INR 1.97 2.88 1.97 1.99 1.62    No results for input(s): DDIMER in the last 72 hours.  Cardiac Enzymes Recent Labs  Lab 12/07/17 0941 12/07/17 2050 12/08/17 0551 12/08/17 1832  CKMB 9.5*  --   --   --   TROPONINI 0.42* 0.34* 0.28* 0.30*   ------------------------------------------------------------------------------------------------------------------    Component Value Date/Time   BNP 295.6 (H) 12/06/2017 1416     Hitomi Slape M.D on 12/11/2017 at 4:53 PM  Between 7am to 7pm - Pager - [phone]  After 7pm go to [email or web address] - password [email or web address]  [facility] -  Office  [phone]   Voice Recognition Reubin Milan dictation system was used to create this note, attempts have been made to correct errors. Please contact the author with questions and/or clarifications.

## 2017-12-11 NOTE — Progress Notes (Signed)
Subjective: Patient seen in bed with mother at the bedside patient was given Haldol within the last hour, so currently very somnolent but easily arousable with mild stimuli.  Patient minimally following commands, does know she is in [place] and able to raise her right arm up with asterixis.  Per mother at bedside patient has been agitated and has been sleeping but been moaning and yelling out when she is having a nightmare.  Exam: Vitals:   12/11/17 0022 12/11/17 0425  BP: 122/78 136/76  Pulse: 87 88  Resp: 20 18  Temp: 98.8 F (37.1 C) 98.1 F (36.7 C)  SpO2: 95% 96%    Physical Exam   HEENT-  Normocephalic, no lesions, without obvious abnormality.  Scleral icterus bilaterally Cardiovascular- S1-S2 audible, pulses palpable throughout   Lungs-normal work of breathing no wheezes rales or rhonchi abdomen- All 4 quadrants palpated and nontender Musculoskeletal-no joint tenderness, deformity or swelling Skin-multiple areas of ecchymosis around right shoulder and bilateral arms.  Skin jaundiced  Neuro:  Mental Status: Patient somnolent in bed easily arousable.  Patient with poor comprehension and decreased mentation and minimally follow commands.  Speech is mildly slurred secondary to sedation.  Able to verbalize that she is in [place]. Cranial Nerves: II: Discs flat bilaterally; Visual fields grossly normal,  III,IV, VI: ptosis not present, extra-ocular motions intact bilaterally pupils equal, round, reactive to light and accommodation V,VII: smile symmetric, unable to assess facial sensation accurately due to patient's somnolence VIII: Hearing intact to voice IX,X: uvula rises symmetrically XII: midline tongue extension Motor: Patient does move all extremities spontaneously and minimally to command patient with tremors and able to raise both arms up with noted asterixis Tone and bulk:normal tone throughout; no atrophy noted Sensory: Unable to accurately assess secondary to  patient's somnolence but does cry out at mild stimuli Deep Tendon Reflexes: 2+ in biceps and ankles, 3+ and patella bilaterally Plantars: Right: downgoing                                Left: downgoing Cerebellar: Patient with altered mental status and unable to participate Gait: Unable to assess secondary to patient's somnolence  Medications:  Current Facility-Administered Medications:  .  Place/Maintain arterial line, , , Until Discontinued **AND** 0.9 %  sodium chloride infusion, , Intra-arterial, PRN, Mackuen, Courteney Lyn, MD .  acetaminophen (TYLENOL) tablet 650 mg, 650 mg, Oral, Q6H PRN **OR** acetaminophen (TYLENOL) suppository 650 mg, 650 mg, Rectal, Q6H PRN, Crosley, Debby, MD .  dextrose 5 % and 0.45 % NaCl with KCl 40 mEq/L infusion, , Intravenous, Continuous, Emokpae, Courage, MD .  folic acid injection 1 mg, 1 mg, Intravenous, Daily, Regalado, Belkys A, MD, 1 mg at 12/11/17 1058 .  haloperidol lactate (HALDOL) injection 5 mg, 5 mg, Intramuscular, Once, Emokpae, Courage, MD .  Influenza vac split quadrivalent PF (FLUARIX) injection 0.5 mL, 0.5 mL, Intramuscular, Tomorrow-1000, Emokpae, Courage, MD .  lactulose (CHRONULAC) 10 GM/15ML solution 30 g, 30 g, Oral, BID, Dianah FieldGribbin, Sarah J, PA-C, 30 g at 12/11/17 0840 .  levothyroxine (SYNTHROID, LEVOTHROID) injection 50 mcg, 50 mcg, Intravenous, Daily, Regalado, Belkys A, MD, 50 mcg at 12/11/17 0911 .  LORazepam (ATIVAN) injection 2-3 mg, 2-3 mg, Intravenous, Q1H PRN, Regalado, Belkys A, MD, 2 mg at 12/10/17 0330 .  LORazepam (ATIVAN) tablet 1 mg, 1 mg, Oral, BID, Emokpae, Courage, MD, 1 mg at 12/11/17 0920 .  ondansetron (ZOFRAN) tablet 4 mg, 4  mg, Oral, Q6H PRN **OR** ondansetron (ZOFRAN) injection 4 mg, 4 mg, Intravenous, Q6H PRN, Crosley, Debby, MD .  pneumococcal 23 valent vaccine (PNU-IMMUNE) injection 0.5 mL, 0.5 mL, Intramuscular, Tomorrow-1000, Emokpae, Courage, MD .  polyethylene glycol (MIRALAX / GLYCOLAX) packet 17 g, 17 g,  Oral, Daily PRN, Crosley, Debby, MD .  potassium chloride (KLOR-CON) packet 40 mEq, 40 mEq, Oral, BID, Emokpae, Courage, MD .  potassium chloride SA (K-DUR,KLOR-CON) CR tablet 40 mEq, 40 mEq, Oral, Once, Emokpae, Courage, MD .  prednisoLONE tablet 40 mg, 40 mg, Oral, Daily, Dianah Field, PA-C, 40 mg at 12/11/17 0911 .  thiamine (B-1) injection 100 mg, 100 mg, Intravenous, Daily, Regalado, Belkys A, MD, 100 mg at 12/11/17 0841  Pertinent Labs/Diagnostics:  Assessment: 47 y.o. female ith a past medical history significant for schizophrenia, alcohol abuse was brought to the ER on 12/06/2016 after being found down by the [facility] in her apartment.  The last time she was seen or heard from was 3 days prior to being found.  Patient was admitted with acute encephalopathy, alcoholic withdrawal, hepatitis and rhabdomyolysis -with preserved renal function, sepsis and started on antibiotics.  Despite interventions, patient continues to have altered mental status decreased level of consciousness and oftentimes agitated likely secondary to alcoholic withdrawal Due to the persistency of her altered mental status, neurology was consulted for further evaluation.    1.  Toxic/metabolic encephalopathy: Likely multifactorial patient came into the hospital with lithium toxicity, alcoholic withdrawal and possibly hepatic encephalopathy as well as rhabdomyolysis.Patient is somnolent with periods of waxing and waning severe agitation and sedation consistent with alcohol withdrawal and sedation likely secondary to hepatic encephalopathy. Patient currently on CIWA protocol with ativan.   She was given Haldol for persistent encephalopathy and agitation agitation which continues despite being treated for several days. On arrival, had a little bit lithium which could be persistent after levels are corrected given equilibrium between the brain and the serum levels being slow.  MRI and EEG pending for further management if both are  unremarkable we will recommend aggressive treatment of her medical comorbidities. 2.  Alcoholic withdrawal  3.  Hepatitis with hepatitis encephalopathy  4.  Schizophrenia 5. Hypothyroidism  Plan: - MRI Brain pending - EEG pending -Continue CIWA protocol with Ativan. - Seizure precautions - Frequent Neuro checks - Telemetry monitoring -  Frequent neuro checks    Vivia Ewing DNP Neuro-hospitalist Team [phone] 12/11/2017, 11:39 AM

## 2017-12-11 NOTE — Progress Notes (Signed)
Went to pt's room - RN was getting tech to clean pt up - will try back later as schedule permits

## 2017-12-11 NOTE — Progress Notes (Signed)
Progress Note   Subjective  Patient remains confused and agitated however more awake today. Mother at bedside. Neurology has seen her.   Objective   Vital signs in last 24 hours: Temp:  [98.1 F (36.7 C)-98.9 F (37.2 C)] 98.1 F (36.7 C) (02/10 0425) Pulse Rate:  [57-92] 88 (02/10 0425) Resp:  [18-20] 18 (02/10 0425) BP: (84-148)/(55-78) 136/76 (02/10 0425) SpO2:  [93 %-96 %] 96 % (02/10 0425) Last BM Date: 12/09/17 General:    white female lying in restraints Heart:  Regular rate and rhythm Lungs: Respirations even and unlabored Abdomen:  Soft, nontender, protuberant.  Extremities:  Without edema. Neurologic:  Alert but not oriented   Intake/Output from previous day: 02/09 0701 - 02/10 0700 In: 3625.8 [P.O.:120; I.V.:3505.8] Out: 220 [Urine:220] Intake/Output this shift: Total I/O In: 120 [P.O.:120] Out: -   Lab Results: Recent Labs    12/09/17 0354 12/11/17 0844  WBC 17.5* 23.0*  HGB 10.4* 11.6*  HCT 31.0* 34.8*  PLT 235 187   BMET Recent Labs    12/09/17 0546 12/11/17 0844 12/11/17 1032  NA 135 138 140  K 5.8* 2.3* 3.9  CL 101 99* 100*  CO2 21* 28 27  GLUCOSE 111* 85 111*  BUN 11 9 8   CREATININE 0.72 0.65 0.66  CALCIUM 7.9* 8.0* 8.1*   LFT Recent Labs    12/11/17 1032  PROT 5.2*  ALBUMIN 2.1*  AST 144*  ALT 62*  ALKPHOS 141*  BILITOT 4.9*   PT/INR Recent Labs    12/09/17 0354 12/11/17 0844  LABPROT 22.5* 19.1*  INR 1.99 1.62    Studies/Results: Mr Abdomen Mrcp Wo Contrast  Result Date: 12/10/2017 CLINICAL DATA:  Abnormal liver function tests EXAM: MRI ABDOMEN WITHOUT CONTRAST  (INCLUDING MRCP) TECHNIQUE: Multiplanar multisequence MR imaging of the abdomen was performed. Heavily T2-weighted images of the biliary and pancreatic ducts were obtained, and three-dimensional MRCP images were rendered by post processing. COMPARISON:  Ultrasound 12/06/2017 FINDINGS: Exam is limited. patient was thrashing about kicking constantly  even after additional sedation. Body motion severely limits body MRI examinations. Lower chest:  Lung bases are clear. Hepatobiliary: Liver has a enlarged caudate lobe. The LEFT hepatic lobe appears mildly shrunken. No discrete nodularity. No intrahepatic biliary duct dilatation. Common duct is grossly. Examination is severely limited by respiratory motion as above. There is moderate volume of free fluid along the margin liver cyst in the RIGHT pericolic gutter. Pancreas: Grossly normal Spleen: Normal spleen. Adrenals/urinary tract: Adrenal glands and kidneys are normal. Stomach/Bowel: Grossly normal Vascular/Lymphatic: Normal caliber.  No clear adenopathy Musculoskeletal: No aggressive osseous lesion IMPRESSION: 1. Severely limited exam. Patient was thrashing and kicking even with sedation. Body motion severely limits MRI body examinations. 2. Moderate volume of free fluid surrounding the liver extending to the RIGHT pericolic gutter as well as the LEFT pericolic gutter. 3. Enlarged caudate lobe. 4. No intrahepatic or extrahepatic duct dilatation identified. 5. Pancreas grossly normal. Electronically Signed   By: Genevive Bi M.D.   On: 12/10/2017 11:54       Assessment / Plan:   47 y/o female with history of schizophrenia and alcoholism, found down for suspected up to 3 days, admitted with rhabdo and encephalopathy, with abnormal LAEs and elevated INR. See prior notes for details of her case. Overall, I suspect she has alcoholic hepatitis along with alcohol withdrawal but has been slow to clear her mental status. Her MRCP showed no obstructive biliary process. Labs for intrinsic liver  disease has showed a mildly low ceruloplasmin, this is being further evaluated by 24 HR urine copper which was collected.  Neurology consultation was obtained yesterday, they agree that toxic / metabolic encephalopathy related to alcohol / lithium is most likely, but will await MRI brain and EEG.  We are treating her for  alcoholic hepatitis with prednisolone 40mg  / day (DF > 32), and would continue that for now. Her INR is improving, she was given a few doses of vitamin K. Supplementing with thiamine and folic acid as well. She is taking oral lactulose and having bowel movements.   Continue supportive care for now, hopefully as she gets through alcohol withdrawal she gets better. Will await MRI / EEG.  Dr. Leone PayorGessner to assume her GI care tomorrow. Please call with questions.  Ileene PatrickSteven Jordy Verba, MD [facility] Pager [phone]

## 2017-12-12 ENCOUNTER — Inpatient Hospital Stay (HOSPITAL_COMMUNITY): Payer: BLUE CROSS/BLUE SHIELD

## 2017-12-12 DIAGNOSIS — N390 Urinary tract infection, site not specified: Secondary | ICD-10-CM

## 2017-12-12 DIAGNOSIS — F1010 Alcohol abuse, uncomplicated: Secondary | ICD-10-CM

## 2017-12-12 DIAGNOSIS — R945 Abnormal results of liver function studies: Secondary | ICD-10-CM

## 2017-12-12 DIAGNOSIS — R4182 Altered mental status, unspecified: Secondary | ICD-10-CM

## 2017-12-12 DIAGNOSIS — G9340 Encephalopathy, unspecified: Secondary | ICD-10-CM

## 2017-12-12 DIAGNOSIS — E876 Hypokalemia: Secondary | ICD-10-CM

## 2017-12-12 LAB — URINALYSIS, MICROSCOPIC (REFLEX)

## 2017-12-12 LAB — CULTURE, BLOOD (ROUTINE X 2)
Culture: NO GROWTH
Culture: NO GROWTH
SPECIAL REQUESTS: ADEQUATE
SPECIAL REQUESTS: ADEQUATE

## 2017-12-12 LAB — BASIC METABOLIC PANEL
Anion gap: 9 (ref 5–15)
BUN: 7 mg/dL (ref 6–20)
CHLORIDE: 102 mmol/L (ref 101–111)
CO2: 29 mmol/L (ref 22–32)
Calcium: 8 mg/dL — ABNORMAL LOW (ref 8.9–10.3)
Creatinine, Ser: 0.66 mg/dL (ref 0.44–1.00)
GFR calc Af Amer: >60 mL/min (ref >60)
GFR calc non Af Amer: >60 mL/min (ref >60)
GLUCOSE: 86 mg/dL (ref 65–99)
POTASSIUM: 2.8 mmol/L — AB (ref 3.5–5.1)
Sodium: 140 mmol/L (ref 135–145)

## 2017-12-12 LAB — URINALYSIS, ROUTINE W REFLEX MICROSCOPIC
GLUCOSE, UA: NEGATIVE mg/dL
Hgb urine dipstick: NEGATIVE
Ketones, ur: 5 mg/dL — AB
LEUKOCYTES UA: NEGATIVE
Nitrite: POSITIVE — AB
Protein, ur: NEGATIVE mg/dL
SPECIFIC GRAVITY, URINE: 1.01 (ref 1.005–1.030)
pH: 8.5 — ABNORMAL HIGH (ref 5.0–8.0)

## 2017-12-12 LAB — LITHIUM LEVEL: Lithium Lvl: 0.45 mmol/L — ABNORMAL LOW (ref 0.60–1.20)

## 2017-12-12 LAB — ANTINUCLEAR ANTIBODIES, IFA: ANTINUCLEAR ANTIBODIES, IFA: NEGATIVE

## 2017-12-12 LAB — AMMONIA: Ammonia: 26 umol/L (ref 9–35)

## 2017-12-12 MED ORDER — SODIUM CHLORIDE 0.9 % IV SOLN
1.0000 g | INTRAVENOUS | Status: DC
  Administered 2017-12-13 – 2017-12-14 (×3): 1 g via INTRAVENOUS
  Filled 2017-12-12 (×4): qty 10

## 2017-12-12 MED ORDER — WHITE PETROLATUM EX OINT
TOPICAL_OINTMENT | CUTANEOUS | Status: AC
  Filled 2017-12-12: qty 28.35

## 2017-12-12 MED ORDER — LORAZEPAM 2 MG/ML IJ SOLN: 1.0000 mg | Freq: Once | INTRAMUSCULAR | Status: DC

## 2017-12-12 MED ORDER — LACTULOSE 10 GM/15ML PO SOLN
30.0000 g | Freq: Two times a day (BID) | ORAL | Status: DC
  Administered 2017-12-13 – 2017-12-16 (×7): 30 g via ORAL
  Filled 2017-12-12 (×7): qty 45

## 2017-12-12 MED ORDER — LACTULOSE 10 GM/15ML PO SOLN
30.0000 g | Freq: Three times a day (TID) | ORAL | Status: DC
  Administered 2017-12-12: 30 g via ORAL
  Filled 2017-12-12 (×2): qty 45

## 2017-12-12 NOTE — Progress Notes (Signed)
EEG completed; results pending.    

## 2017-12-12 NOTE — Procedures (Signed)
ELECTROENCEPHALOGRAM REPORT  Date of Study: 12/12/2017  Patient's Name: Amy Powers MRN: [id number] Date of Birth: 1971-10-02  Referring Provider: Vivia EwingPearl Amankwah, NP  Clinical History: This is a 47 year old woman with altered mental status.  Medications: Prn Ativan Folic acid Lactulose Synthroid Thiamine   Technical Summary: A multichannel digital EEG recording measured by the international 10-20 system with electrodes applied with paste and impedances below 5000 ohms performed as portable with EKG monitoring in an awake and asleep patient.  Hyperventilation and photic stimulation were not performed.  The digital EEG was referentially recorded, reformatted, and digitally filtered in a variety of bipolar and referential montages for optimal display.   Description: The patient is awake and asleep during the recording. She is noted to be confused and restless.  During maximal wakefulness, there is a poorly sustained 7 Hz posterior dominant rhythm that poorly attenuates with eye opening and eye closure. This is admixed with a moderate amount of diffuse 4-5 Hz theta and occasional diffuse 2-3 Hz delta slowing, at times with triphasic-like appearance. During drowsiness and sleep, there is an increase in theta and delta slowing of the background with poorly formed vertex waves seen.  Hyperventilation and photic stimulation were not performed.  There were no epileptiform discharges or electrographic seizures seen.    EKG lead was unremarkable.  Impression: This awake and asleep EEG is abnormal due to moderate diffuse slowing of the waking background.  Clinical Correlation of the above findings indicates diffuse cerebral dysfunction that is non-specific in etiology and can be seen with hypoxic/ischemic injury, toxic/metabolic encephalopathies, neurodegenerative disorders, or medication effect.  The absence of epileptiform discharges does not rule out a clinical diagnosis of epilepsy.  Clinical  correlation is advised.   Patrcia DollyKaren Rhodesia Stanger, M.D.

## 2017-12-12 NOTE — Progress Notes (Signed)
   Patient Name: Amy Powers Date of Encounter: 12/12/2017, 12:06 PM    Subjective  Patient is a 47 yo female with PMH of schizophrenia and etoh abuse admitted in the hospital following being found down at her home for upto 3 days. She was admitted on 2/5 with rhabdomyolysis, encephalopathy, abnormal LAEs and elevated INR. She was treated for alcoholic hepatitis with prednisolone, MRCP was unremarkable, mildly low ceruloplasmic being evaluated with 24h urine copper. She was given Vit K, potassium, thiamine, and folic acid supplementation along with oral lactulose to help with BMs.  Today patient is still somnolent with slowed mentation, however she seems to be appearing. Pt's mom is at bedside and agrees, reports pts isn't eating much but she is much more cooperative with her meds. Pt is having frequent BMs, diarrhea that has made her bottom raw according to her mom, reports slight abdominal pain. Appears to be still withdrawing from etoh, ongoing cold sweats, chills, no reported seizures.   Objective  BP 112/84 (BP Location: Right Arm)   Pulse 79   Temp 98.6 F (37 C) (Oral)   Resp (!) 21   Ht 5\' 5"  (1.651 m)   Wt 68 kg (150 lb)   LMP  (LMP Unknown)   SpO2 96%   BMI 24.96 kg/m   General: somnolent female, no acute distress Lungs: CTAB CV: RRR, no mgr, no LE edema GI: soft, non-tender abdomen, slight rigidity with deep palpation Skin: Erythema noted on her neck and arms- mom reports pt's dad also has sensitive skin Neuro: alert to name, birthday, place, and president. Says the year is [id number]. Psych: slightly delayed affect   Assessment and Plan  1. Metabolic encephalopathy 2. Alcoholic hepatitis/elevated LFTs 3. UTI 4. Hypokalemia  Encephalopathy- EEG performed this AM, awaiting results. MRI still pending. Lithium level 0.55 yday to 0.45 today.    Alcoholic hepatitis likely causing high LFTs, continue prednisolone  Ammonia levels wnl. Consider decreased lactulose to  BID?  Hypokalemia (K 2.8) could also be a contributing factor to her somnolence, continue supplementing potassium  UA today shows positive nitrites, culture pending- managed by hospitalist- could be contributing to delayed affect.  Contiue zofran, polyethylene glycol.  Wilson dz workup pending.   I have personally seen the patient, reviewed and repeated key elements of the history and physical and participated in formation of the assessment and plan the student has documented.  Given NL NH3 suspect withdrawal and Tx most of what we are seeing here now. Will reduce lactulose to bid.  Continue supportive care.   Iva Booparl E. Gessner, MD, [facility] [phone] (pager) 12/12/2017 4:46 PM

## 2017-12-12 NOTE — Progress Notes (Addendum)
PROGRESS NOTE [facility]   Amy Powers   ZOX:[id number] DOB: 02-21-1971  DOA: 12/06/2017 PCP: Patient, No Pcp Per   Brief Narrative:  Amy Powers is a 47 year old female with past medical history significant for alcohol abuse, alcoholic cirrhosis, schizophrenia and hypothyroidism who was admitted to the hospital after being found down at her home.  She was found to be in rhabdomyolysis, and encephalopathy with elevated LFTs and elevated INR.  She was treated for alcoholic hepatitis and GI was consulted.  Patient admitted with working diagnosis of metabolic encephalopathy possible alcohol withdrawal.  Subjective: Patient seen and examined, she seems somnolent with slow mentation.  Responding to questions appropriately.  Still with diaphoresis and chills.  Lactulose causing frequent bowel movements  Assessment & Plan: Acute metabolic encephalopathy Felt to be multifactorial due to alcohol abuse/DTs, hepatic encephalopathy, rhabdomyolysis, lithium toxicity, hypothyroidism and UTI Ammonia level trending down, neurology was consulted and recommended EEG which is consistent with metabolic encephalopathy, MRI pending.  Continue benzos for alcohol withdrawal.  Will start Rocephin for UTI, follow-up urine cultures  UTI UA grossly abnormal with positive nitrates We will add Rocephin, follow urine cultures Elevated WBC, check CBC in a.m.  Alcoholic cirrhosis/elevated LFTs Elevated LFTs, ammonia trending down patient on lactulose will switch to twice daily as patient continues with frequent bowel movement.  Continue prednisolone.  GI recommendations appreciated Ceruloplasmin is low workup for Wilson disease in progress.  MRCP was performed shows no biliary ductal dilation/obstructive process.  Acute viral profile negative  Alcohol abuse/alcohol withdrawals Continue CIWA protocol with Ativan Will d/c scheduled Ativan as patient is very somnolent  Lithium toxicity On admission lithium  levels are elevated, leading has been placed on hold. Lithium levels trending down  Hypothyroidism Patient started on Synthroid -initially treated with IV now transitioned to p.o. Check TSH in 6-week  Hypokalemia Replete Check potassium and magnesium in a.m.  Rhabdomyolysis - resolved  DVT prophylaxis: SCDs Code Status: Full code Family Communication: None at bedside Disposition Plan: Pending medical improvement  Consultants:   GI  Neurology  Procedures:   EEG  Antimicrobials:  Rocephin 12/12/17   Objective: Vitals:   12/11/17 2025 12/12/17 0013 12/12/17 0245 12/12/17 1239  BP: 120/83 118/76 112/84   Pulse: 78 76 79 87  Resp: 15 20 (!) 21 18  Temp: 98.5 F (36.9 C) 98.5 F (36.9 C) 98.6 F (37 C)   TempSrc: Oral Oral Oral Oral  SpO2: 90% 94% 96%   Weight:      Height:        Intake/Output Summary (Last 24 hours) at 12/12/2017 1550 Last data filed at 12/12/2017 0244 Gross per 24 hour  Intake 1225 ml  Output 150 ml  Net 1075 ml   Filed Weights   12/06/17 1600  Weight: 68 kg (150 lb)    Examination:  General exam: Somnolent HEENT: Jaundice Respiratory system: Clear to auscultation. No wheezes,crackle or rhonchi Cardiovascular system: S1 & S2 heard, RRR. No JVD, murmurs, rubs or gallops Gastrointestinal system: Abdomen distended, nontender, no fluid wave noted Central nervous system: Oriented to person and place only, nonfocal Extremities: No lower extremity edema Skin: No rash Psychiatry: Unable to perform due to somnolence   Data Reviewed: I have personally reviewed following labs and imaging studies  CBC: Recent Labs  Lab 12/06/17 1411 12/07/17 0308 12/08/17 0551 12/09/17 0354 12/11/17 0844  WBC 20.5* 18.0* 17.2* 17.5* 23.0*  NEUTROABS 17.1*  --   --   --   --   HGB  11.1* 11.8* 10.6* 10.4* 11.6*  HCT 33.0* 35.2* 32.1* 31.0* 34.8*  MCV 113.8* 115.4* 114.6* 113.6* 114.5*  PLT 365 309 282 235 187   Basic Metabolic Panel: Recent Labs   Lab 12/07/17 0941 12/08/17 0551 12/09/17 0546 12/11/17 0844 12/11/17 1032 12/12/17 0907  NA  --  138 135 138 140 140  K  --  3.3* 5.8* 2.3* 3.9 2.8*  CL  --  108 101 99* 100* 102  CO2  --  19* 21* 28 27 29   GLUCOSE  --  121* 111* 85 111* 86  BUN  --  12 11 9 8 7   CREATININE  --  0.68 0.72 0.65 0.66 0.66  CALCIUM  --  7.7* 7.9* 8.0* 8.1* 8.0*  MG 2.1  --   --  1.8  --   --    GFR: Estimated Creatinine Clearance: 79.1 mL/min (by C-G formula based on SCr of 0.66 mg/dL). Liver Function Tests: Recent Labs  Lab 12/07/17 0941 12/08/17 0551 12/09/17 0546 12/11/17 0844 12/11/17 1032  AST 178* 143* 174* 119* 144*  ALT 56* 56* 65* 67* 62*  ALKPHOS 170* 161* 180* 146* 141*  BILITOT 4.9* 4.3* 5.1* 4.2* 4.9*  PROT 5.5* 5.0* 5.7* 5.1* 5.2*  ALBUMIN 2.0* 1.8* 2.2* 2.1* 2.1*   Recent Labs  Lab 12/06/17 1411  LIPASE 54*   Recent Labs  Lab 12/06/17 1513 12/09/17 0908 12/11/17 0844 12/12/17 0907  AMMONIA 41* 70* 51* 26   Coagulation Profile: Recent Labs  Lab 12/06/17 1411 12/07/17 1717 12/08/17 1832 12/09/17 0354 12/11/17 0844  INR 1.97 2.88 1.97 1.99 1.62   Cardiac Enzymes: Recent Labs  Lab 12/06/17 1513 12/07/17 0941 12/07/17 2050 12/08/17 0551 12/08/17 1832 12/11/17 0844  CKTOTAL 1,708* 679*  --   --   --  227  CKMB  --  9.5*  --   --   --   --   TROPONINI  --  0.42* 0.34* 0.28* 0.30*  --    BNP (last 3 results) No results for input(s): PROBNP in the last 8760 hours. HbA1C: No results for input(s): HGBA1C in the last 72 hours. CBG: Recent Labs  Lab 12/06/17 1406 12/06/17 1606 12/06/17 1726  GLUCAP 80 66 101*   Lipid Profile: No results for input(s): CHOL, HDL, LDLCALC, TRIG, CHOLHDL, LDLDIRECT in the last 72 hours. Thyroid Function Tests: No results for input(s): TSH, T4TOTAL, FREET4, T3FREE, THYROIDAB in the last 72 hours. Anemia Panel: No results for input(s): VITAMINB12, FOLATE, FERRITIN, TIBC, IRON, RETICCTPCT in the last 72 hours. Sepsis  Labs: Recent Labs  Lab 12/06/17 1425  LATICACIDVEN 1.82    Recent Results (from the past 240 hour(s))  Culture, blood (Routine X 2) w Reflex to ID Panel     Status: None   Collection Time: 12/06/17  8:55 PM  Result Value Ref Range Status   Specimen Description BLOOD A-LINE  Final   Special Requests   Final    BOTTLES DRAWN AEROBIC AND ANAEROBIC Blood Culture adequate volume   Culture   Final    NO GROWTH 5 DAYS Performed at [facility], [address], [place], [place] [address]    Report Status 12/12/2017 FINAL  Final  Culture, blood (Routine X 2) w Reflex to ID Panel     Status: None   Collection Time: 12/06/17 11:29 PM  Result Value Ref Range Status   Specimen Description BLOOD CENTRAL LINE  Final   Special Requests   Final  BOTTLES DRAWN AEROBIC AND ANAEROBIC Blood Culture adequate volume   Culture   Final    NO GROWTH 5 DAYS Performed at [facility], [address] [place], [place] [address]    Report Status 12/12/2017 FINAL  Final  Urine Culture     Status: None   Collection Time: 12/07/17 12:41 PM  Result Value Ref Range Status   Specimen Description URINE, RANDOM  Final   Special Requests NONE  Final   Culture   Final    NO GROWTH Performed at [facility], [address] [place], [place] [address]    Report Status 12/08/2017 FINAL  Final      Radiology Studies: No results found.   Scheduled Meds: . folic acid  1 mg Oral Daily  . Influenza vac split quadrivalent PF  0.5 mL Intramuscular Tomorrow-1000  . lactulose  30 g Oral TID  . levothyroxine  100 mcg Oral QAC breakfast  . LORazepam  1 mg Intravenous Once  . pneumococcal 23 valent vaccine  0.5 mL Intramuscular Tomorrow-1000  . potassium chloride  40 mEq Oral BID  . prednisoLONE  40 mg Oral Daily  . thiamine  100 mg Oral Daily   Continuous Infusions: . sodium chloride    . dextrose 5 % and 0.45 % NaCl with KCl 40 mEq/L 125 mL/hr at 12/11/17 2200     LOS: 6 days   Time  spent: Total of 35 minutes spent with pt, greater than 50% of which was spent in discussion of  treatment, counseling and coordination of care    Latrelle DodrillEdwin Silva, MD Pager: Text Page via [email or web address]   If 7PM-7AM, please contact night-coverage [email or web address] 12/12/2017, 3:50 PM

## 2017-12-12 NOTE — Progress Notes (Signed)
EEG shows no ongoing seizure. Still awaiting MRI but most likely metabolic in origen. I have spoken with Dr. Edward JollySilva and he will call Neurology back if any abnormalities are found on MRI. Otherwise Neurology will S/O  Felicie MornDavid Isham Smitherman PA-C [facility] [phone]  M-F  (8:30 am- 4 PM)  12/12/2017, 4:28 PM

## 2017-12-12 NOTE — Progress Notes (Signed)
Pt went down to MRI  Prn haldol given per order apparently pt was thrashing and couldn't complete MRI

## 2017-12-12 NOTE — Progress Notes (Signed)
Iv team paged and piv restarted pt is weeping from upper extremities, noted approximally 10 min later that IV site was bleeding turned off piv and called IV team who stated that the IV was good to restart fluid and change dresing

## 2017-12-13 ENCOUNTER — Inpatient Hospital Stay (HOSPITAL_COMMUNITY): Payer: BLUE CROSS/BLUE SHIELD

## 2017-12-13 DIAGNOSIS — K7011 Alcoholic hepatitis with ascites: Secondary | ICD-10-CM

## 2017-12-13 DIAGNOSIS — G9340 Encephalopathy, unspecified: Secondary | ICD-10-CM

## 2017-12-13 DIAGNOSIS — G9341 Metabolic encephalopathy: Secondary | ICD-10-CM

## 2017-12-13 DIAGNOSIS — K7290 Hepatic failure, unspecified without coma: Secondary | ICD-10-CM

## 2017-12-13 LAB — COMPREHENSIVE METABOLIC PANEL
ALBUMIN: 1.8 g/dL — AB (ref 3.5–5.0)
ALK PHOS: 122 U/L (ref 38–126)
ALT: 49 U/L (ref 14–54)
ANION GAP: 10 (ref 5–15)
AST: 84 U/L — ABNORMAL HIGH (ref 15–41)
BUN: 7 mg/dL (ref 6–20)
CALCIUM: 7.9 mg/dL — AB (ref 8.9–10.3)
CO2: 27 mmol/L (ref 22–32)
Chloride: 102 mmol/L (ref 101–111)
Creatinine, Ser: 0.67 mg/dL (ref 0.44–1.00)
GFR calc non Af Amer: >60 mL/min (ref >60)
GLUCOSE: 92 mg/dL (ref 65–99)
POTASSIUM: 3.5 mmol/L (ref 3.5–5.1)
Sodium: 139 mmol/L (ref 135–145)
Total Bilirubin: 4 mg/dL — ABNORMAL HIGH (ref 0.3–1.2)
Total Protein: 4.4 g/dL — ABNORMAL LOW (ref 6.5–8.1)

## 2017-12-13 LAB — CBC WITH DIFFERENTIAL/PLATELET
BASOS ABS: 0 10*3/uL (ref 0.0–0.1)
Basophils Relative: 0 %
EOS ABS: 0.5 10*3/uL (ref 0.0–0.7)
Eosinophils Relative: 2 %
HEMATOCRIT: 30.8 % — AB (ref 36.0–46.0)
HEMOGLOBIN: 9.9 g/dL — AB (ref 12.0–15.0)
LYMPHS PCT: 18 %
Lymphs Abs: 4.1 10*3/uL — ABNORMAL HIGH (ref 0.7–4.0)
MCH: 37.4 pg — ABNORMAL HIGH (ref 26.0–34.0)
MCHC: 32.1 g/dL (ref 30.0–36.0)
MCV: 116.2 fL — ABNORMAL HIGH (ref 78.0–100.0)
MONOS PCT: 6 %
Monocytes Absolute: 1.4 10*3/uL — ABNORMAL HIGH (ref 0.1–1.0)
NEUTROS ABS: 16.9 10*3/uL — AB (ref 1.7–7.7)
NEUTROS PCT: 74 %
Platelets: 195 10*3/uL (ref 150–400)
RBC: 2.65 MIL/uL — ABNORMAL LOW (ref 3.87–5.11)
RDW: 14.4 % (ref 11.5–15.5)
WBC: 22.9 10*3/uL — ABNORMAL HIGH (ref 4.0–10.5)

## 2017-12-13 LAB — URINE CULTURE: Culture: NO GROWTH

## 2017-12-13 LAB — LITHIUM LEVEL: Lithium Lvl: 0.32 mmol/L — ABNORMAL LOW (ref 0.60–1.20)

## 2017-12-13 LAB — AMMONIA: Ammonia: 54 umol/L — ABNORMAL HIGH (ref 9–35)

## 2017-12-13 LAB — MAGNESIUM: MAGNESIUM: 1.9 mg/dL (ref 1.7–2.4)

## 2017-12-13 MED ORDER — SPIRONOLACTONE 25 MG PO TABS
50.0000 mg | ORAL_TABLET | Freq: Every day | ORAL | Status: DC
  Administered 2017-12-13 – 2017-12-16 (×4): 50 mg via ORAL
  Filled 2017-12-13 (×4): qty 2

## 2017-12-13 MED ORDER — FUROSEMIDE 20 MG PO TABS
20.0000 mg | ORAL_TABLET | Freq: Every day | ORAL | Status: DC
  Administered 2017-12-13 – 2017-12-16 (×4): 20 mg via ORAL
  Filled 2017-12-13 (×4): qty 1

## 2017-12-13 NOTE — Progress Notes (Signed)
   Patient Name: Amy Powers Date of Encounter: 12/13/2017, 10:37 AM    Subjective  Pt is much more alert and conversational today. She was able to answer questions and interact with others the room coherently. She reports no pain, nausea, vomiting, or other complains today. She does say her "bottom feels raw" from the diarrhea, otherwise she is very cooperative and lucid today.   Objective  BP 117/89 (BP Location: Right Arm)   Pulse 76   Temp 97.9 F (36.6 C) (Oral)   Resp 18   Ht 5\' 5"  (1.651 m)   Wt 68 kg (150 lb)   LMP  (LMP Unknown)   SpO2 94%   BMI 24.96 kg/m   General: much more alert, NAD Lungs: CTAB, no wheezing or accessory muscle use CV: RRR, no mgr GI: soft, non-tender, no abd pain or tenderness with palpation, BS + Neuro: AOx3 Psych: alert and appropriate affect   Assessment and Plan   1. Metabolic encephalopathy- multifactorial etiology- etoh abuse/withdrawal, hepatic encephalopathy, rhabdomyolysis, lithium toxicity, hypothyroidism, UTI. Ammonia level increased from 26 yday to 54 today, she is doing much better symptomatically- much more alert.  2. Alcoholic hepatitis/elevated LFTs- continue prednisolone. Viral profile negative, MRCP showed no biliary ductal dilation or obstructive process. Leron CroakWilson dz work up still in progress.  3. UTI- on Rocephin, followed by Hospitalist  4. Hypokalemia- resolved.   5. Anemia- 11.6 yday to 9.9 today, likely d/t dehydration, will monitor.   EEG 2/11 showed no ongoing seizures, MRI results pending. Lithium level- trending down.   Lactulose decreased to BID yday, ammonia level increased. Per nursing, she has not had a BM today or last night. Pt is improving symptomatically, she is much less obtunded today, will continue watching and continue lactulose BID for now.   Continue supportive care. Hospitalist and Neurology input appreciated.       [place] [facility] Attending   I have personally seen the patient, reviewed and  repeated key elements of the history and physical and participated in formation of the assessment and plan the student has documented.  She is markedly better today - ? Withdrawal meds effect yesterday.  Needs continued supportive care. NH 3 up but mentation better and no asterixis  Will start diuretics (furosemide and spironolactone) for ascites/edema  Prednisolone 40 mg qd x 4 weeks then a 2 week taper off.  Will f/u tomorrow   Iva Booparl E. Shalaine Payson, MD, [facility]  [facility] [phone] (pager) 12/13/2017 4:04 PM

## 2017-12-13 NOTE — Progress Notes (Signed)
[facility], and ambulated about 5 ft to chair. Pt too weak to walk in hall. Tolerated transfers with 2 assist well. Pt sitting in chair. Call light in reach. Will continue to monitor.  Versie StarksHanna  Claron Rosencrans, RN

## 2017-12-13 NOTE — Progress Notes (Signed)
PROGRESS NOTE [facility]   Amy Powers   ZOX:[id number] DOB: 1971/04/01  DOA: 12/06/2017 PCP: Patient, No Pcp Per   Brief Narrative:  Amy Powers is a 47 year old female with past medical history significant for alcohol abuse, alcoholic cirrhosis, schizophrenia and hypothyroidism who was admitted to the hospital after being found down at her home.  She was found to be in rhabdomyolysis, and encephalopathy with elevated LFTs and elevated INR.  She was treated for alcoholic hepatitis and GI was consulted.  Patient admitted with working diagnosis of metabolic encephalopathy possible alcohol withdrawal.  Subjective: Patient seen and examined, significantly improved mentally.  She is more alert oriented and coherent. She reports no chest pain, shortness of breath or abdominal pains.  No acute events overnight.  MRI negative for acute abnormalities.  Assessment & Plan: Acute metabolic encephalopathy Felt to be multifactorial due to alcohol abuse/DTs, hepatic encephalopathy, rhabdomyolysis, lithium toxicity, hypothyroidism and UTI Ammonia level trending down, neurology was consulted and recommended EEG which is consistent with metabolic encephalopathy, MRI negative for acute abnormalities.  Getting treatment for UTI with Rocephin, at this point I do not think that this is consistent with alcohol withdrawal. Get patient out of bed, PT has been ordered  UTI UA grossly abnormal with positive nitrates Urine cultures shows no growth although after starting antibiotic patient clinically improved we will continue treatment for at least 5 days.  Can switch to oral formulation in a.m. WBCs trending down, monitor in a.m.  Alcoholic cirrhosis/elevated LFTs Elevated LFTs, Continue prednisolone.  GI recommendations appreciated.  Ammonia slight up today, will continue lactulose twice daily for now as patient continues to have frequent bowel movement.  If ammonia continues to increase will need to change  to 3 times daily. Ceruloplasmin is low workup for Wilson disease in progress.  MRCP was performed shows no biliary ductal dilation/obstructive process.  Acute viral hepatitis profile negative  Alcohol abuse/alcohol withdrawals Discontinue CIWA protocol as she has not required any Ativan since 2/9 D/c scheduled Ativan as patient is very somnolent Continue to monitor  Lithium toxicity On admission lithium levels are elevated, leading has been placed on hold. Lithium levels trending down  Hypothyroidism Patient started on Synthroid -initially treated with IV now transitioned to p.o. Check TSH in 6-week  Hypokalemia Replete Check potassium and magnesium in a.m.  Rhabdomyolysis - resolved  DVT prophylaxis: SCDs Code Status: Full code Family Communication: None at bedside Disposition Plan: Pending medical improvement, PT has been ordered for disposition.  Consultants:   GI  Neurology  Procedures:   EEG  Antimicrobials:  Rocephin 12/12/17   Objective: Vitals:   12/12/17 1944 12/12/17 2358 12/13/17 0348 12/13/17 0750  BP: (!) 115/56 117/81 115/71 117/89  Pulse: 93 82 76   Resp: (!) 25 20 (!) 21 18  Temp: 98.5 F (36.9 C) 98.3 F (36.8 C) 98.7 F (37.1 C) 97.9 F (36.6 C)  TempSrc: Oral Oral Oral Oral  SpO2:   92% 94%  Weight:      Height:        Intake/Output Summary (Last 24 hours) at 12/13/2017 0840 Last data filed at 12/13/2017 0500 Gross per 24 hour  Intake 1010 ml  Output -  Net 1010 ml   Filed Weights   12/06/17 1600  Weight: 68 kg (150 lb)    Examination:  General: Pt is alert, awake, not in acute distress Cardiovascular: RRR, S1/S2 +, no rubs, no gallops Respiratory: CTA bilaterally, no wheezing, no rhonchi Abdominal: Soft, abdomen distended, nontender  Extremities: Lower extremity edema 1+ Neuro: Alert oriented x3, confusion has significantly improved, no focal findings  Data Reviewed: I have personally reviewed following labs and imaging  studies  CBC: Recent Labs  Lab 12/06/17 1411 12/07/17 0308 12/08/17 0551 12/09/17 0354 12/11/17 0844 12/13/17 0658  WBC 20.5* 18.0* 17.2* 17.5* 23.0* 22.9*  NEUTROABS 17.1*  --   --   --   --  PENDING  HGB 11.1* 11.8* 10.6* 10.4* 11.6* 9.9*  HCT 33.0* 35.2* 32.1* 31.0* 34.8* 30.8*  MCV 113.8* 115.4* 114.6* 113.6* 114.5* 116.2*  PLT 365 309 282 235 187 195   Basic Metabolic Panel: Recent Labs  Lab 12/07/17 0941  12/09/17 0546 12/11/17 0844 12/11/17 1032 12/12/17 0907 12/13/17 0658  NA  --    < > 135 138 140 140 139  K  --    < > 5.8* 2.3* 3.9 2.8* 3.5  CL  --    < > 101 99* 100* 102 102  CO2  --    < > 21* 28 27 29 27   GLUCOSE  --    < > 111* 85 111* 86 92  BUN  --    < > 11 9 8 7 7   CREATININE  --    < > 0.72 0.65 0.66 0.66 0.67  CALCIUM  --    < > 7.9* 8.0* 8.1* 8.0* 7.9*  MG 2.1  --   --  1.8  --   --  1.9   < > = values in this interval not displayed.   GFR: Estimated Creatinine Clearance: 79.1 mL/min (by C-G formula based on SCr of 0.67 mg/dL). Liver Function Tests: Recent Labs  Lab 12/08/17 0551 12/09/17 0546 12/11/17 0844 12/11/17 1032 12/13/17 0658  AST 143* 174* 119* 144* 84*  ALT 56* 65* 67* 62* 49  ALKPHOS 161* 180* 146* 141* 122  BILITOT 4.3* 5.1* 4.2* 4.9* 4.0*  PROT 5.0* 5.7* 5.1* 5.2* 4.4*  ALBUMIN 1.8* 2.2* 2.1* 2.1* 1.8*   Recent Labs  Lab 12/06/17 1411  LIPASE 54*   Recent Labs  Lab 12/06/17 1513 12/09/17 0908 12/11/17 0844 12/12/17 0907 12/13/17 0658  AMMONIA 41* 70* 51* 26 54*   Coagulation Profile: Recent Labs  Lab 12/06/17 1411 12/07/17 1717 12/08/17 1832 12/09/17 0354 12/11/17 0844  INR 1.97 2.88 1.97 1.99 1.62   Cardiac Enzymes: Recent Labs  Lab 12/06/17 1513 12/07/17 0941 12/07/17 2050 12/08/17 0551 12/08/17 1832 12/11/17 0844  CKTOTAL 1,708* 679*  --   --   --  227  CKMB  --  9.5*  --   --   --   --   TROPONINI  --  0.42* 0.34* 0.28* 0.30*  --    BNP (last 3 results) No results for input(s): PROBNP  in the last 8760 hours. HbA1C: No results for input(s): HGBA1C in the last 72 hours. CBG: Recent Labs  Lab 12/06/17 1406 12/06/17 1606 12/06/17 1726  GLUCAP 80 66 101*   Lipid Profile: No results for input(s): CHOL, HDL, LDLCALC, TRIG, CHOLHDL, LDLDIRECT in the last 72 hours. Thyroid Function Tests: No results for input(s): TSH, T4TOTAL, FREET4, T3FREE, THYROIDAB in the last 72 hours. Anemia Panel: No results for input(s): VITAMINB12, FOLATE, FERRITIN, TIBC, IRON, RETICCTPCT in the last 72 hours. Sepsis Labs: Recent Labs  Lab 12/06/17 1425  LATICACIDVEN 1.82    Recent Results (from the past 240 hour(s))  Culture, blood (Routine X 2) w Reflex to ID Panel     Status: None  Collection Time: 12/06/17  8:55 PM  Result Value Ref Range Status   Specimen Description BLOOD A-LINE  Final   Special Requests   Final    BOTTLES DRAWN AEROBIC AND ANAEROBIC Blood Culture adequate volume   Culture   Final    NO GROWTH 5 DAYS Performed at [facility], [address] [place], [place] [address]    Report Status 12/12/2017 FINAL  Final  Culture, blood (Routine X 2) w Reflex to ID Panel     Status: None   Collection Time: 12/06/17 11:29 PM  Result Value Ref Range Status   Specimen Description BLOOD CENTRAL LINE  Final   Special Requests   Final    BOTTLES DRAWN AEROBIC AND ANAEROBIC Blood Culture adequate volume   Culture   Final    NO GROWTH 5 DAYS Performed at [facility], [address] [place], [place] [address]    Report Status 12/12/2017 FINAL  Final  Urine Culture     Status: None   Collection Time: 12/07/17 12:41 PM  Result Value Ref Range Status   Specimen Description URINE, RANDOM  Final   Special Requests NONE  Final   Culture   Final    NO GROWTH Performed at [facility], [address] [place], [place] [address]    Report Status 12/08/2017 FINAL  Final      Radiology Studies: No results found.   Scheduled Meds: . folic acid  1 mg Oral  Daily  . Influenza vac split quadrivalent PF  0.5 mL Intramuscular Tomorrow-1000  . lactulose  30 g Oral BID  . levothyroxine  100 mcg Oral QAC breakfast  . LORazepam  1 mg Intravenous Once  . pneumococcal 23 valent vaccine  0.5 mL Intramuscular Tomorrow-1000  . potassium chloride  40 mEq Oral BID  . prednisoLONE  40 mg Oral Daily  . thiamine  100 mg Oral Daily   Continuous Infusions: . sodium chloride    . cefTRIAXone (ROCEPHIN)  IV Stopped (12/13/17 0435)  . dextrose 5 % and 0.45 % NaCl with KCl 40 mEq/L 125 mL/hr at 12/11/17 2200     LOS: 7 days   Time spent: Total of 25 minutes spent with pt, greater than 50% of which was spent in discussion of  treatment, counseling and coordination of care    Latrelle Dodrill, MD Pager: Text Page via [email or web address]   If 7PM-7AM, please contact night-coverage [email or web address] 12/13/2017, 8:40 AM

## 2017-12-14 DIAGNOSIS — F10230 Alcohol dependence with withdrawal, uncomplicated: Secondary | ICD-10-CM

## 2017-12-14 DIAGNOSIS — D649 Anemia, unspecified: Secondary | ICD-10-CM

## 2017-12-14 NOTE — Evaluation (Signed)
Physical Therapy Evaluation Patient Details Name: Amy Powers MRN: [id number] DOB: 1971/04/14 Today's Date: 12/14/2017   History of Present Illness  47yo female who was found in her apartment unresponsive, was taken to the ED via EMS, found to be hypotensive in the ED. Diagnosed with lithium toxicity, AMS, Rhabdomyolysis. PMH schizophrenia, hypothyroidism.   Clinical Impression   Patient received in bed, eager to work with PT and ambulate this morning. She is able to perform functional bed mobility with Min guard and Mod cues for form, and requires ModA for first attempt at sit to stand but on second attempt was generally able to perform with Min guard and cues for safety. Able to gait train approximately 30f this morning with deficits as identified below, difficulty navigating obstacles in the environment and requiring MinA for safe use of RW/safety with gait as a whole. She was left up in the chair with alarm set, all needs otherwise met this morning. Due to her high PLOF in comparison with current deficits, she may benefit from skilled rehabilitation in the CIR setting moving forward.     Follow Up Recommendations CIR    Equipment Recommendations  Rolling walker with 5" wheels    Recommendations for Other Services       Precautions / Restrictions Precautions Precautions: Fall Precaution Comments: rhabdomyolysis  Restrictions Weight Bearing Restrictions: No      Mobility  Bed Mobility Overal bed mobility: Needs Assistance Bed Mobility: Rolling;Sidelying to Sit Rolling: Min guard Sidelying to sit: Min guard       General bed mobility comments: Min guard and Mod cues for rolling and sidelying to sit transfer   Transfers Overall transfer level: Needs assistance Equipment used: Rolling walker (2 wheeled) Transfers: Sit to/from Stand Sit to Stand: Min guard         General transfer comment: ModA on first attempt, second attempt able to perform with just min guard and  cues for sequencing/safety   Ambulation/Gait Ambulation/Gait assistance: Min assist Ambulation Distance (Feet): 45 Feet(565fto chair, then 4033fn room ) Assistive device: Rolling walker (2 wheeled) Gait Pattern/deviations: Step-through pattern;Decreased step length - right;Decreased step length - left;Decreased stride length;Decreased dorsiflexion - right;Decreased dorsiflexion - left;Shuffle;Narrow base of support;Trunk flexed     General Gait Details: cues to correct gait mechanics, noted difficulty with sequencing and required MinA for navigation with walker   Stairs            Wheelchair Mobility    Modified Rankin (Stroke Patients Only)       Balance Overall balance assessment: Needs assistance Sitting-balance support: Bilateral upper extremity supported;Feet supported Sitting balance-Leahy Scale: Good Sitting balance - Comments: able to maintain midline sitting at EOB without difficulty    Standing balance support: Bilateral upper extremity supported;During functional activity Standing balance-Leahy Scale: Fair Standing balance comment: reliant on UE support, mildly  unsteady                              Pertinent Vitals/Pain Pain Assessment: No/denies pain    Home Living Family/patient expects to be discharged to:: Private residence Living Arrangements: Alone Available Help at Discharge: Family Type of Home: Apartment Home Access: Level entry     Home Layout: One level Home Equipment: None      Prior Function Level of Independence: Independent               Hand Dominance  Extremity/Trunk Assessment        Lower Extremity Assessment Lower Extremity Assessment: Generalized weakness    Cervical / Trunk Assessment Cervical / Trunk Assessment: Normal  Communication   Communication: No difficulties  Cognition Arousal/Alertness: Awake/alert Behavior During Therapy: WFL for tasks assessed/performed Overall Cognitive  Status: Within Functional Limits for tasks assessed                                        General Comments      Exercises     Assessment/Plan    PT Assessment Patient needs continued PT services  PT Problem List Decreased strength;Decreased mobility;Decreased safety awareness;Decreased coordination;Decreased activity tolerance;Decreased balance;Decreased knowledge of use of DME       PT Treatment Interventions DME instruction;Therapeutic activities;Gait training;Therapeutic exercise;Patient/family education;Stair training;Balance training;Functional mobility training;Neuromuscular re-education    PT Goals (Current goals can be found in the Care Plan section)  Acute Rehab PT Goals Patient Stated Goal: to get well  PT Goal Formulation: With patient Time For Goal Achievement: 12/28/17 Potential to Achieve Goals: Good    Frequency Min 3X/week   Barriers to discharge        Co-evaluation               AM-PAC PT "6 Clicks" Daily Activity  Outcome Measure Difficulty turning over in bed (including adjusting bedclothes, sheets and blankets)?: Unable Difficulty moving from lying on back to sitting on the side of the bed? : Unable Difficulty sitting down on and standing up from a chair with arms (e.g., wheelchair, bedside commode, etc,.)?: Unable Help needed moving to and from a bed to chair (including a wheelchair)?: A Little Help needed walking in hospital room?: A Little Help needed climbing 3-5 steps with a railing? : A Lot 6 Click Score: 11    End of Session Equipment Utilized During Treatment: Gait belt Activity Tolerance: Patient tolerated treatment well;Patient limited by fatigue Patient left: in chair;with chair alarm set;with call bell/phone within reach   PT Visit Diagnosis: Unsteadiness on feet (R26.81);Muscle weakness (generalized) (M62.81);Other abnormalities of gait and mobility (R26.89);Difficulty in walking, not elsewhere classified  (R26.2)    Time: 5597-4163 PT Time Calculation (min) (ACUTE ONLY): 32 min   Charges:   PT Evaluation $PT Eval Moderate Complexity: 1 Mod PT Treatments $Gait Training: 8-22 mins   PT G Codes:        Deniece Ree PT, DPT, CBIS  Supplemental Physical Therapist [place]   Pager [phone]

## 2017-12-14 NOTE — Progress Notes (Signed)
PROGRESS NOTE [facility]   Amy Powers   ZOX:[id number] DOB: 1971/06/05  DOA: 12/06/2017 PCP: Patient, No Pcp Per   Brief Narrative:  Amy Powers is a 47 year old female with past medical history significant for alcohol abuse, alcoholic cirrhosis, schizophrenia and hypothyroidism who was admitted to the hospital after being found down at her home.  She was found to be in rhabdomyolysis, and encephalopathy with elevated LFTs and elevated INR.  She was treated for alcoholic hepatitis and GI was consulted.  Patient admitted with working diagnosis of metabolic encephalopathy possible alcohol withdrawal.  Subjective: Patient seen and examined, RN as well as nurse tech at bedside.  Patient mentions significant improvement.  No acute complaint.  No nausea no vomiting.  Did have a BM yesterday.  No fever no chills.  Assessment & Plan: Acute metabolic encephalopathy Felt to be multifactorial due to alcohol abuse/DTs, hepatic encephalopathy, rhabdomyolysis, lithium toxicity, hypothyroidism and UTI Ammonia level trending down, neurology was consulted and recommended EEG which is consistent with metabolic encephalopathy, MRI negative for acute abnormalities.  Getting treatment for UTI with Rocephin, at this point I do not think that this is consistent with alcohol withdrawal. PT recommends CIR although I feel that the patient will show significant improvement.  Will request PT to reassess tomorrow.  UTI UA grossly abnormal with positive nitrates Urine cultures shows no growth although after starting antibiotic patient clinically improved we will continue treatment for at least 5 days.  Switch to Keflex tomorrow.  Alcoholic cirrhosis/elevated LFTs Elevated LFTs, Continue prednisolone.  GI recommendations appreciated.  Ammonia slight up today, will continue lactulose twice daily for now as patient continues to have frequent bowel movement.  If ammonia continues to increase will need to change to 3  times daily. Ceruloplasmin is low workup for Wilson disease in progress.  MRCP was performed shows no biliary ductal dilation/obstructive process.  Acute viral hepatitis profile negative  Alcohol abuse/alcohol withdrawals Discontinue CIWA protocol as she has not required any Ativan since 2/9 D/c scheduled Ativan as patient is very somnolent Continue to monitor  Lithium toxicity On admission lithium levels are elevated, leading has been placed on hold. Lithium levels trending down, levels subtherapeutic now.  Although the levels on admission were not significantly elevated to cause metabolic encephalopathy.  Should resume it tomorrow.  Hypothyroidism Patient started on Synthroid -initially treated with IV now transitioned to p.o. Check TSH in 6-week  Hypokalemia Replete Check potassium and magnesium in a.m.  Rhabdomyolysis - resolved  DVT prophylaxis: SCDs Code Status: Full code Family Communication: None at bedside Disposition Plan: Pending medical improvement, PT has been ordered for disposition.  Consultants:   GI  Neurology  Procedures:   EEG  Antimicrobials:  Rocephin 12/12/17   Objective: Vitals:   12/13/17 2357 12/14/17 0414 12/14/17 0824 12/14/17 1136  BP: 105/78 108/78 (!) 116/91 (!) 116/91  Pulse: (!) 121 87  100  Resp: (!) 23 (!) 21 18 (!) 28  Temp: 98.4 F (36.9 C) 98 F (36.7 C) 97.8 F (36.6 C) (!) 97.5 F (36.4 C)  TempSrc: Oral Oral Oral Oral  SpO2: 94% 100% 95% 94%  Weight:      Height:       No intake or output data in the 24 hours ending 12/14/17 1407 Filed Weights   12/06/17 1600  Weight: 68 kg (150 lb)    Examination:  General: Pt is alert, awake, not in acute distress Cardiovascular: RRR, S1/S2 +, no rubs, no gallops Respiratory: CTA bilaterally, no  wheezing, no rhonchi Abdominal: Soft, abdomen distended, nontender Extremities: Lower extremity edema 1+ Neuro: Alert oriented x3, confusion has significantly improved, no focal  findings  Data Reviewed: I have personally reviewed following labs and imaging studies  CBC: Recent Labs  Lab 12/08/17 0551 12/09/17 0354 12/11/17 0844 12/13/17 0658  WBC 17.2* 17.5* 23.0* 22.9*  NEUTROABS  --   --   --  16.9*  HGB 10.6* 10.4* 11.6* 9.9*  HCT 32.1* 31.0* 34.8* 30.8*  MCV 114.6* 113.6* 114.5* 116.2*  PLT 282 235 187 195   Basic Metabolic Panel: Recent Labs  Lab 12/09/17 0546 12/11/17 0844 12/11/17 1032 12/12/17 0907 12/13/17 0658  NA 135 138 140 140 139  K 5.8* 2.3* 3.9 2.8* 3.5  CL 101 99* 100* 102 102  CO2 21* 28 27 29 27   GLUCOSE 111* 85 111* 86 92  BUN 11 9 8 7 7   CREATININE 0.72 0.65 0.66 0.66 0.67  CALCIUM 7.9* 8.0* 8.1* 8.0* 7.9*  MG  --  1.8  --   --  1.9   GFR: Estimated Creatinine Clearance: 79.1 mL/min (by C-G formula based on SCr of 0.67 mg/dL). Liver Function Tests: Recent Labs  Lab 12/08/17 0551 12/09/17 0546 12/11/17 0844 12/11/17 1032 12/13/17 0658  AST 143* 174* 119* 144* 84*  ALT 56* 65* 67* 62* 49  ALKPHOS 161* 180* 146* 141* 122  BILITOT 4.3* 5.1* 4.2* 4.9* 4.0*  PROT 5.0* 5.7* 5.1* 5.2* 4.4*  ALBUMIN 1.8* 2.2* 2.1* 2.1* 1.8*   No results for input(s): LIPASE, AMYLASE in the last 168 hours. Recent Labs  Lab 12/09/17 0908 12/11/17 0844 12/12/17 0907 12/13/17 0658  AMMONIA 70* 51* 26 54*   Coagulation Profile: Recent Labs  Lab 12/07/17 1717 12/08/17 1832 12/09/17 0354 12/11/17 0844  INR 2.88 1.97 1.99 1.62   Cardiac Enzymes: Recent Labs  Lab 12/07/17 2050 12/08/17 0551 12/08/17 1832 12/11/17 0844  CKTOTAL  --   --   --  227  TROPONINI 0.34* 0.28* 0.30*  --    BNP (last 3 results) No results for input(s): PROBNP in the last 8760 hours. HbA1C: No results for input(s): HGBA1C in the last 72 hours. CBG: No results for input(s): GLUCAP in the last 168 hours. Lipid Profile: No results for input(s): CHOL, HDL, LDLCALC, TRIG, CHOLHDL, LDLDIRECT in the last 72 hours. Thyroid Function Tests: No results  for input(s): TSH, T4TOTAL, FREET4, T3FREE, THYROIDAB in the last 72 hours. Anemia Panel: No results for input(s): VITAMINB12, FOLATE, FERRITIN, TIBC, IRON, RETICCTPCT in the last 72 hours. Sepsis Labs: No results for input(s): PROCALCITON, LATICACIDVEN in the last 168 hours.  Recent Results (from the past 240 hour(s))  Culture, blood (Routine X 2) w Reflex to ID Panel     Status: None   Collection Time: 12/06/17  8:55 PM  Result Value Ref Range Status   Specimen Description BLOOD A-LINE  Final   Special Requests   Final    BOTTLES DRAWN AEROBIC AND ANAEROBIC Blood Culture adequate volume   Culture   Final    NO GROWTH 5 DAYS Performed at [facility], [address] [place], [place] [address]    Report Status 12/12/2017 FINAL  Final  Culture, blood (Routine X 2) w Reflex to ID Panel     Status: None   Collection Time: 12/06/17 11:29 PM  Result Value Ref Range Status   Specimen Description BLOOD CENTRAL LINE  Final   Special Requests   Final    BOTTLES  DRAWN AEROBIC AND ANAEROBIC Blood Culture adequate volume   Culture   Final    NO GROWTH 5 DAYS Performed at [facility], [address], [place], [place] [address]    Report Status 12/12/2017 FINAL  Final  Urine Culture     Status: None   Collection Time: 12/07/17 12:41 PM  Result Value Ref Range Status   Specimen Description URINE, RANDOM  Final   Special Requests NONE  Final   Culture   Final    NO GROWTH Performed at [facility], [address], [place], [place] [address]    Report Status 12/08/2017 FINAL  Final  Culture, Urine     Status: None   Collection Time: 12/12/17  8:46 AM  Result Value Ref Range Status   Specimen Description URINE, CATHETERIZED  Final   Special Requests NONE  Final   Culture   Final    NO GROWTH Performed at [facility], [address], [place], [place] [address]    Report Status 12/13/2017 FINAL  Final      Radiology Studies: Mr Brain Wo Contrast  Result  Date: 12/13/2017 CLINICAL DATA:  Altered level of consciousness. Found down at home. History of alcohol abuse and cirrhosis. EXAM: MRI HEAD WITHOUT CONTRAST TECHNIQUE: Multiplanar, multiecho pulse sequences of the brain and surrounding structures were obtained without intravenous contrast. COMPARISON:  Head CT 12/06/2017 FINDINGS: Brain: There is no evidence of acute infarct, intracranial hemorrhage, mass, midline shift, or extra-axial fluid collection. Mild cerebral and cerebellar atrophy are advanced for age. Small foci of cerebral white matter T2 hyperintensity, predominantly in the frontal lobes, are nonspecific but compatible with mild chronic small vessel ischemic disease. Vascular: Major intracranial vascular flow voids are preserved. Skull and upper cervical spine: Unremarkable bone marrow signal. Sinuses/Orbits: Unremarkable orbits. Small bilateral mastoid effusions. Clear paranasal sinuses. Other: None. IMPRESSION: 1. No acute intracranial abnormality. 2. Age advanced cerebral and cerebellar atrophy. 3. Mild chronic small vessel ischemic disease. Electronically Signed   By: Sebastian Ache M.D.   On: 12/13/2017 10:44     Scheduled Meds: . folic acid  1 mg Oral Daily  . furosemide  20 mg Oral Daily  . Influenza vac split quadrivalent PF  0.5 mL Intramuscular Tomorrow-1000  . lactulose  30 g Oral BID  . levothyroxine  100 mcg Oral QAC breakfast  . pneumococcal 23 valent vaccine  0.5 mL Intramuscular Tomorrow-1000  . potassium chloride  40 mEq Oral BID  . prednisoLONE  40 mg Oral Daily  . spironolactone  50 mg Oral Daily  . thiamine  100 mg Oral Daily   Continuous Infusions: . cefTRIAXone (ROCEPHIN)  IV Stopped (12/14/17 0415)     LOS: 8 days   Time spent: Total of 25 minutes spent with pt, greater than 50% of which was spent in discussion of  treatment, counseling and coordination of care    Lynden Oxford, MD Pager: Text Page via [email or web address]   If 7PM-7AM, please contact  night-coverage [email or web address] 12/14/2017, 2:07 PM

## 2017-12-14 NOTE — Progress Notes (Addendum)
   Patient Name: Amy Powers Date of Encounter: 12/14/2017, 10:13 AM    Subjective  Pt continues to improve, she is even more alert than yesterday, clear, coherent in her speech and mentation. She reports no pain, dyspnea, N/V/D, swelling, or dysuria today. She does have some mild abdominal bloating. Mom at bedside. She had her hair cleaned combed last night, no longer appears disheveled. She is starting PT today.    Objective  BP (!) 116/91 (BP Location: Right Arm)   Pulse 87   Temp 97.8 F (36.6 C) (Oral)   Resp 18   Ht 5\' 5"  (1.651 m)   Wt 68 kg (150 lb)   LMP  (LMP Unknown)   SpO2 95%   BMI 24.96 kg/m   General:  NAD Eyes:   anicteric Lungs:  clear Heart::  S1S2 no rubs, murmurs or gallops Abdomen:  soft and nontender, BS+ Ext:   no edema, cyanosis or clubbing   Assessment and Plan   1. Metabolic encephalopathy- multifactorial- hx of etoh abuse, hepatic encephalopathy, rhabdo, lithium tox, hypothyroidism, UTI. Continues to improve, very happy with the progress that she is making. Starting PT today.  2. Alcoholic hepatitis/elevated LFTs- continue prednisolone 40 mg qd x 4 wks, then a 2 wk taper off. 24 hr copper for wilson dz workup results pending. Started on lasix and spironolactone for ascites/edema yday.  3. UTI- on rocephin, followed by hospitalist  4. Anemia-   5. Rhabdomyolysis resolved   Will discuss with Dr. Leone PayorGessner.  Continue supportive care, lactulose, prednisolone, lasix, spiranolactone.   Hospitalist and Neurology input appreciated.      [facility] Attending    I have personally seen the patient, reviewed and repeated key elements of the history and physical and participated in formation of the assessment and plan the student has documented.  She is improving and I will sign off.  Urinary copper is ok  Discussed w/ Dr. Allena KatzPatel.  We will arrange a f/u in our office with Dr. Adela LankArmbruster or APP in a few weeks after dc Should be documented before  dc  No change in meds at this time though you may be able to titrate down on lactulose.  Iva Booparl E. Gessner, MD, Antionette FairyFACG [facility] [phone] (pager) 12/14/2017 6:21 PM

## 2017-12-15 LAB — CBC WITH DIFFERENTIAL/PLATELET
BAND NEUTROPHILS: 1 %
BASOS ABS: 0 10*3/uL (ref 0.0–0.1)
BASOS PCT: 0 %
Blasts: 0 %
EOS PCT: 0 %
Eosinophils Absolute: 0 10*3/uL (ref 0.0–0.7)
HCT: 33.7 % — ABNORMAL LOW (ref 36.0–46.0)
Hemoglobin: 11 g/dL — ABNORMAL LOW (ref 12.0–15.0)
Lymphocytes Relative: 8 %
Lymphs Abs: 2 10*3/uL (ref 0.7–4.0)
MCH: 38.2 pg — ABNORMAL HIGH (ref 26.0–34.0)
MCHC: 32.6 g/dL (ref 30.0–36.0)
MCV: 117 fL — ABNORMAL HIGH (ref 78.0–100.0)
METAMYELOCYTES PCT: 0 %
MONO ABS: 0.7 10*3/uL (ref 0.1–1.0)
Monocytes Relative: 3 %
Myelocytes: 0 %
Neutro Abs: 21.8 10*3/uL — ABNORMAL HIGH (ref 1.7–7.7)
Neutrophils Relative %: 88 %
Other: 0 %
Platelets: 196 10*3/uL (ref 150–400)
Promyelocytes Absolute: 0 %
RBC: 2.88 MIL/uL — ABNORMAL LOW (ref 3.87–5.11)
RDW: 14.2 % (ref 11.5–15.5)
WBC: 24.5 10*3/uL — ABNORMAL HIGH (ref 4.0–10.5)
nRBC: 0 /100 WBC

## 2017-12-15 LAB — COMPREHENSIVE METABOLIC PANEL
ALT: 46 U/L (ref 14–54)
AST: 74 U/L — AB (ref 15–41)
Albumin: 2 g/dL — ABNORMAL LOW (ref 3.5–5.0)
Alkaline Phosphatase: 152 U/L — ABNORMAL HIGH (ref 38–126)
Anion gap: 8 (ref 5–15)
BUN: 6 mg/dL (ref 6–20)
CHLORIDE: 107 mmol/L (ref 101–111)
CO2: 23 mmol/L (ref 22–32)
CREATININE: 0.65 mg/dL (ref 0.44–1.00)
Calcium: 8.1 mg/dL — ABNORMAL LOW (ref 8.9–10.3)
GFR calc non Af Amer: >60 mL/min (ref >60)
Glucose, Bld: 79 mg/dL (ref 65–99)
Potassium: 4.3 mmol/L (ref 3.5–5.1)
SODIUM: 138 mmol/L (ref 135–145)
Total Bilirubin: 3.5 mg/dL — ABNORMAL HIGH (ref 0.3–1.2)
Total Protein: 5.2 g/dL — ABNORMAL LOW (ref 6.5–8.1)

## 2017-12-15 LAB — MISC LABCORP TEST (SEND OUT)
LabCorp test name: 24
Labcorp test code: 3343

## 2017-12-15 LAB — MAGNESIUM: Magnesium: 1.8 mg/dL (ref 1.7–2.4)

## 2017-12-15 LAB — PROTIME-INR
INR: 1.84
Prothrombin Time: 21.1 seconds — ABNORMAL HIGH (ref 11.4–15.2)

## 2017-12-15 LAB — AMMONIA: AMMONIA: 29 umol/L (ref 9–35)

## 2017-12-15 MED ORDER — CEPHALEXIN 500 MG PO CAPS
500.0000 mg | ORAL_CAPSULE | Freq: Two times a day (BID) | ORAL | Status: DC
  Administered 2017-12-15 – 2017-12-16 (×3): 500 mg via ORAL
  Filled 2017-12-15 (×3): qty 1

## 2017-12-15 MED ORDER — POTASSIUM CHLORIDE CRYS ER 20 MEQ PO TBCR: 40.0000 meq | EXTENDED_RELEASE_TABLET | Freq: Two times a day (BID) | ORAL | Status: DC

## 2017-12-15 NOTE — Progress Notes (Signed)
Physical Therapy Treatment Patient Details Name: Amy Powers MRN: [id number] DOB: 1971/09/02 Today's Date: 12/15/2017    History of Present Illness 46yo female who was found in her apartment unresponsive, was taken to the ED via EMS, found to be hypotensive in the ED. Diagnosed with lithium toxicity, AMS, Rhabdomyolysis. PMH schizophrenia, hypothyroidism.     PT Comments    Pt performed gait to and from bathroom with emphasis on toilet transfer to improve safety with transition to home.  Informed case manager that patient will require RW and 3:1 commode at d/c.  Pt now has 24 hour supervision and her mom is present to stay with her for 2 months.  Will inform supervising PT of need for change in recommendations.    Follow Up Recommendations  Home health PT;Supervision/Assistance - 24 hour     Equipment Recommendations  Rolling walker with 5" wheels;3in1 (PT)    Recommendations for Other Services       Precautions / Restrictions Precautions Precautions: Fall Precaution Comments: rhabdomyolysis  Restrictions Weight Bearing Restrictions: No    Mobility  Bed Mobility Overal bed mobility: Needs Assistance Bed Mobility: Rolling;Sidelying to Sit Rolling: Supervision Sidelying to sit: Supervision       General bed mobility comments: Min guard and Mod cues for rolling and sidelying to sit transfer   Transfers Overall transfer level: Needs assistance Equipment used: Rolling walker (2 wheeled) Transfers: Sit to/from Stand Sit to Stand: Min guard         General transfer comment: Cues for hand placement to improve safety to and from seated surface.    Ambulation/Gait Ambulation/Gait assistance: Supervision Ambulation Distance (Feet): 12 Feet(x2 trials) Assistive device: Rolling walker (2 wheeled) Gait Pattern/deviations: Step-through pattern;Decreased stride length;Narrow base of support;Trunk flexed   Gait velocity interpretation: Below normal speed for  age/gender General Gait Details: Cues for safety with RW and cues for forward gaze.     Stairs            Wheelchair Mobility    Modified Rankin (Stroke Patients Only)       Balance Overall balance assessment: Needs assistance Sitting-balance support: Bilateral upper extremity supported;Feet supported Sitting balance-Leahy Scale: Good       Standing balance-Leahy Scale: Fair Standing balance comment: reliant on UE support, mildly  unsteady                             Cognition Arousal/Alertness: Awake/alert Behavior During Therapy: WFL for tasks assessed/performed Overall Cognitive Status: Within Functional Limits for tasks assessed                                        Exercises      General Comments        Pertinent Vitals/Pain Pain Assessment: No/denies pain    Home Living                      Prior Function            PT Goals (current goals can now be found in the care plan section) Acute Rehab PT Goals Patient Stated Goal: to get well  Potential to Achieve Goals: Good Progress towards PT goals: Progressing toward goals    Frequency    Min 3X/week      PT Plan Discharge plan needs to be updated  Co-evaluation              AM-PAC PT "6 Clicks" Daily Activity  Outcome Measure  Difficulty turning over in bed (including adjusting bedclothes, sheets and blankets)?: None Difficulty moving from lying on back to sitting on the side of the bed? : None Difficulty sitting down on and standing up from a chair with arms (e.g., wheelchair, bedside commode, etc,.)?: A Lot Help needed moving to and from a bed to chair (including a wheelchair)?: A Little Help needed walking in hospital room?: A Little Help needed climbing 3-5 steps with a railing? : A Little 6 Click Score: 19    End of Session Equipment Utilized During Treatment: Gait belt Activity Tolerance: Patient tolerated treatment well;Patient  limited by fatigue Patient left: in chair;with chair alarm set;with call bell/phone within reach   PT Visit Diagnosis: Unsteadiness on feet (R26.81);Muscle weakness (generalized) (M62.81);Other abnormalities of gait and mobility (R26.89);Difficulty in walking, not elsewhere classified (R26.2)     Time: [id number] PT Time Calculation (min) (ACUTE ONLY): 22 min  Charges:  $Gait Training: 8-22 mins                    G Codes:       Joycelyn RuaAimee Everline Mahaffy, PTA pager [phone]    Florestine AversAimee J Breylin Dom 12/15/2017, 5:23 PM

## 2017-12-15 NOTE — Progress Notes (Signed)
Rehab Admissions Coordinator Note:  Patient was screened by Clois DupesBoyette, Donye Dauenhauer Godwin for appropriateness for an Inpatient Acute Rehab Consult per PT recommendation first time up. I would like to see how she progresses over the next 24  To 48 hrs before determining rehab venue needs. OT eval pending.I will follow.Clois Dupes.  Seham Gardenhire Godwin 12/15/2017, 7:47 AM  I can be reached at [phone].

## 2017-12-15 NOTE — Care Management Note (Signed)
Case Management Note Amy PieriniKristi Hopie Pellegrin RN, BSN Unit 4E-Case Manager [phone]  Patient Details  Name: Amy Powers MRN: [id number] Date of Birth: 06-Aug-1971  Subjective/Objective:  Pt admitted after being found down at home- hx ETOH, acute encephalopathy, lithium toxicity.                    Action/Plan: PTA pt lived at home alone, independent-, mother lives in [place]. PT recommendation for CIR, CIR screened pt and will follow-   Expected Discharge Date:   12/16/17               Expected Discharge Plan:  Home w Home Health Services  In-House Referral:  Clinical Social Work  Discharge planning Services  [facility]  Post Acute Care Choice:  Durable Medical Equipment, Home Health Choice offered to:  Patient, Parent  DME Arranged:  3-N-1, Walker rolling DME Agency:  [facility]  HH Arranged:  RN, PT HH Agency:  [facility]  Status of Service:  Completed, signed off  If discussed at Long Length of Stay Meetings, dates discussed:    Discharge Disposition: home/home health   Additional Comments:  12/15/17- 1130- Amy PieriniKristi Rosibel Giacobbe RN CM- CSW and CM spoke with pt and mother at bedside to discuss transition needs- per conversation pt and mother state that they would prefer to [facility]- [facility] and has progressed past needed CIR. Mom plans to stay for several months to make sure pt goes to follow appointments and is doing well. Pt states [facility]- and has paperwork at home- plans to f/u and establish with doctor as planned prior to coming to hospital. Pt and mother report that pt does have insurance through [employer]- pt was laid off her job back in Dec. And applied for [employer] Her insurance was with [employer] [place]- pt provided her [employer] card- copy made along with [employer] paperwork- [facility] regarding insurance verification.  Discussed HH  and DME- pt states she wants a RW and will await PT to see again today for further recommendations- [facility]- [facility]. Have discussed plans with MD - await PT re-eval for possible d/c later today vs tomorrow.  1545- PT has seen pt again with updated recommendation for safe [facility] and DME -RW and 3n1- will ask MD for orders for both.  Darrold SpanWebster, Jhania Etherington Hall, RN 12/15/2017, 3:48 PM

## 2017-12-15 NOTE — Progress Notes (Signed)
CSW and RNCM met with pt and pt mom to discuss DC options.  Mom states she is planning to stay with patient for 2 months to assist with recovery- they feel comfortable going home with home health services.  Per pt and mom pt still covered through [employer] after being let go from job in late December- insurance information copied and RNCM to investigate home health benefits  No further CSW needs at this time- signing off  Jorge Ny, [place] Social Worker [phone]

## 2017-12-15 NOTE — Progress Notes (Signed)
PROGRESS NOTE [facility]   Amy Powers   ZOX:[id number] DOB: 01/22/1971  DOA: 12/06/2017 PCP: Patient, No Pcp Per   Brief Narrative:  Amy Powers is a 47 year old female with past medical history significant for alcohol abuse, alcoholic cirrhosis, schizophrenia and hypothyroidism who was admitted to the hospital after being found down at her home.  She was found to be in rhabdomyolysis, and encephalopathy with elevated LFTs and elevated INR.  She was treated for alcoholic hepatitis and GI was consulted.  Patient admitted with working diagnosis of metabolic encephalopathy possible alcohol withdrawal.  Subjective: Denies any acute complaint.  Has a significant weakness.  No nausea no vomiting.  No fever no chills.  Assessment & Plan: Acute metabolic encephalopathy Felt to be multifactorial due to alcohol abuse/DTs, hepatic encephalopathy, rhabdomyolysis, lithium toxicity, hypothyroidism and UTI Ammonia level trending down, neurology was consulted and recommended EEG which is consistent with metabolic encephalopathy, MRI negative for acute abnormalities.  Getting treatment for UTI with Rocephin, at this point I do not think that this is consistent with alcohol withdrawal. PT recommends CIR although patient wants to go home.  Home with home health will be arranged tomorrow.  UTI UA grossly abnormal with positive nitrates Urine cultures shows no growth although after starting antibiotic patient clinically improved we will continue treatment for at least 5 days.  [facility] to [facility].  Alcoholic cirrhosis/elevated LFTs Elevated LFTs, Continue prednisolone.  GI recommendations appreciated.  Ammonia slight up today, will continue lactulose twice daily for now as patient continues to have frequent bowel movement.  If ammonia continues to increase will need to change to 3 times daily. Ceruloplasmin is low workup for Wilson disease in progress.  MRCP was performed shows no biliary ductal  dilation/obstructive process.  Acute viral hepatitis profile negative  Alcohol abuse/alcohol withdrawals Discontinue CIWA protocol as she has not required any Ativan since 2/9 D/c scheduled Ativan as patient is very somnolent Continue to monitor  Lithium toxicity On admission lithium levels are elevated, leading has been placed on hold. Lithium levels trending down, levels subtherapeutic now.  Although the levels on admission were not significantly elevated to cause metabolic encephalopathy.  Should resume it tomorrow.  Hypothyroidism Patient started on Synthroid -initially treated with IV now transitioned to p.o. Check TSH in 6-week  Hypokalemia Replete Check potassium and magnesium in a.m.  Rhabdomyolysis - resolved  DVT prophylaxis: SCDs Code Status: Full code Family Communication: None at bedside Disposition Plan: Monitor response to antibiotic change today.  Consultants:   GI  Neurology  Procedures:   EEG  Antimicrobials:  Rocephin 12/12/17   Objective: Vitals:   12/15/17 0410 12/15/17 0828 12/15/17 0840 12/15/17 1224  BP: 124/77 109/87 113/85   Pulse: 86 (!) 105 94   Resp: (!) 23 18 20 18   Temp: 98 F (36.7 C) 97.7 F (36.5 C) 97.6 F (36.4 C)   TempSrc: Oral Oral Oral   SpO2: 96% 97% 98%   Weight: 91 kg (200 lb 9.6 oz)     Height:        Intake/Output Summary (Last 24 hours) at 12/15/2017 1816 Last data filed at 12/15/2017 0800 Gross per 24 hour  Intake 240 ml  Output -  Net 240 ml   Filed Weights   12/06/17 1600 12/15/17 0410  Weight: 68 kg (150 lb) 91 kg (200 lb 9.6 oz)    Examination:  General: Pt is alert, awake, not in acute distress Cardiovascular: RRR, S1/S2 +, no rubs, no gallops Respiratory: CTA  bilaterally, no wheezing, no rhonchi Abdominal: Soft, abdomen distended, nontender Extremities: Lower extremity edema 1+ Neuro: Alert oriented x3, confusion has significantly improved, no focal findings  Data Reviewed: I have  personally reviewed following labs and imaging studies  CBC: Recent Labs  Lab 12/09/17 0354 12/11/17 0844 12/13/17 0658 12/15/17 0355  WBC 17.5* 23.0* 22.9* 24.5*  NEUTROABS  --   --  16.9* 21.8*  HGB 10.4* 11.6* 9.9* 11.0*  HCT 31.0* 34.8* 30.8* 33.7*  MCV 113.6* 114.5* 116.2* 117.0*  PLT 235 187 195 196   Basic Metabolic Panel: Recent Labs  Lab 12/11/17 0844 12/11/17 1032 12/12/17 0907 12/13/17 0658 12/15/17 0355  NA 138 140 140 139 138  K 2.3* 3.9 2.8* 3.5 4.3  CL 99* 100* 102 102 107  CO2 28 27 29 27 23   GLUCOSE 85 111* 86 92 79  BUN 9 8 7 7 6   CREATININE 0.65 0.66 0.66 0.67 0.65  CALCIUM 8.0* 8.1* 8.0* 7.9* 8.1*  MG 1.8  --   --  1.9 1.8   GFR: Estimated Creatinine Clearance: 97.9 mL/min (by C-G formula based on SCr of 0.65 mg/dL). Liver Function Tests: Recent Labs  Lab 12/09/17 0546 12/11/17 0844 12/11/17 1032 12/13/17 0658 12/15/17 0355  AST 174* 119* 144* 84* 74*  ALT 65* 67* 62* 49 46  ALKPHOS 180* 146* 141* 122 152*  BILITOT 5.1* 4.2* 4.9* 4.0* 3.5*  PROT 5.7* 5.1* 5.2* 4.4* 5.2*  ALBUMIN 2.2* 2.1* 2.1* 1.8* 2.0*   No results for input(s): LIPASE, AMYLASE in the last 168 hours. Recent Labs  Lab 12/09/17 0908 12/11/17 0844 12/12/17 0907 12/13/17 0658 12/15/17 0355  AMMONIA 70* 51* 26 54* 29   Coagulation Profile: Recent Labs  Lab 12/08/17 1832 12/09/17 0354 12/11/17 0844 12/15/17 0355  INR 1.97 1.99 1.62 1.84   Cardiac Enzymes: Recent Labs  Lab 12/08/17 1832 12/11/17 0844  CKTOTAL  --  227  TROPONINI 0.30*  --    BNP (last 3 results) No results for input(s): PROBNP in the last 8760 hours. HbA1C: No results for input(s): HGBA1C in the last 72 hours. CBG: No results for input(s): GLUCAP in the last 168 hours. Lipid Profile: No results for input(s): CHOL, HDL, LDLCALC, TRIG, CHOLHDL, LDLDIRECT in the last 72 hours. Thyroid Function Tests: No results for input(s): TSH, T4TOTAL, FREET4, T3FREE, THYROIDAB in the last 72  hours. Anemia Panel: No results for input(s): VITAMINB12, FOLATE, FERRITIN, TIBC, IRON, RETICCTPCT in the last 72 hours. Sepsis Labs: No results for input(s): PROCALCITON, LATICACIDVEN in the last 168 hours.  Recent Results (from the past 240 hour(s))  Culture, blood (Routine X 2) w Reflex to ID Panel     Status: None   Collection Time: 12/06/17  8:55 PM  Result Value Ref Range Status   Specimen Description BLOOD A-LINE  Final   Special Requests   Final    BOTTLES DRAWN AEROBIC AND ANAEROBIC Blood Culture adequate volume   Culture   Final    NO GROWTH 5 DAYS Performed at [facility], [address] [place], [place] [address]    Report Status 12/12/2017 FINAL  Final  Culture, blood (Routine X 2) w Reflex to ID Panel     Status: None   Collection Time: 12/06/17 11:29 PM  Result Value Ref Range Status   Specimen Description BLOOD CENTRAL LINE  Final   Special Requests   Final    BOTTLES DRAWN AEROBIC AND ANAEROBIC Blood Culture adequate volume   Culture  Final    NO GROWTH 5 DAYS Performed at [facility], [address], [place], [place] [address]    Report Status 12/12/2017 FINAL  Final  Urine Culture     Status: None   Collection Time: 12/07/17 12:41 PM  Result Value Ref Range Status   Specimen Description URINE, RANDOM  Final   Special Requests NONE  Final   Culture   Final    NO GROWTH Performed at [facility], [address], [place], [place] [address]    Report Status 12/08/2017 FINAL  Final  Culture, Urine     Status: None   Collection Time: 12/12/17  8:46 AM  Result Value Ref Range Status   Specimen Description URINE, CATHETERIZED  Final   Special Requests NONE  Final   Culture   Final    NO GROWTH Performed at [facility], [address], [place], [place] [address]    Report Status 12/13/2017 FINAL  Final      Radiology Studies: No results found.   Scheduled Meds: . cephALEXin  500 mg Oral Q12H  . folic acid  1 mg Oral  Daily  . furosemide  20 mg Oral Daily  . Influenza vac split quadrivalent PF  0.5 mL Intramuscular Tomorrow-1000  . lactulose  30 g Oral BID  . levothyroxine  100 mcg Oral QAC breakfast  . pneumococcal 23 valent vaccine  0.5 mL Intramuscular Tomorrow-1000  . prednisoLONE  40 mg Oral Daily  . spironolactone  50 mg Oral Daily  . thiamine  100 mg Oral Daily   Continuous Infusions:    LOS: 9 days   Time spent: Total of 25 minutes spent with pt, greater than 50% of which was spent in discussion of  treatment, counseling and coordination of care    Lynden Oxford, MD Pager: Text Page via [email or web address]   If 7PM-7AM, please contact night-coverage [email or web address] 12/15/2017, 6:16 PM

## 2017-12-16 MED ORDER — PREDNISOLONE 5 MG PO TABS: 40.0000 mg | ORAL_TABLET | Freq: Every day | ORAL | 0 refills | Status: DC

## 2017-12-16 MED ORDER — PREDNISOLONE 15 MG/5ML PO SOLN: 40.0000 mg | Freq: Every day | ORAL | 0 refills | Status: DC

## 2017-12-16 MED ORDER — FOLIC ACID 1 MG PO TABS: 1.0000 mg | ORAL_TABLET | Freq: Every day | ORAL | 0 refills | Status: AC

## 2017-12-16 MED ORDER — DIPHENHYDRAMINE HCL 25 MG PO CAPS
25.0000 mg | ORAL_CAPSULE | ORAL | Status: DC | PRN
  Administered 2017-12-16 (×2): 25 mg via ORAL
  Filled 2017-12-16 (×2): qty 1

## 2017-12-16 MED ORDER — LACTULOSE 10 GM/15ML PO SOLN: 30.0000 g | Freq: Two times a day (BID) | ORAL | 0 refills | Status: DC

## 2017-12-16 MED ORDER — THIAMINE HCL 100 MG PO TABS: 100.0000 mg | ORAL_TABLET | Freq: Every day | ORAL | 0 refills | Status: AC

## 2017-12-16 MED ORDER — FUROSEMIDE 20 MG PO TABS: 20.0000 mg | ORAL_TABLET | Freq: Every day | ORAL | 0 refills | Status: AC

## 2017-12-16 MED ORDER — SPIRONOLACTONE 50 MG PO TABS: 50.0000 mg | ORAL_TABLET | Freq: Every day | ORAL | 0 refills | Status: AC

## 2017-12-16 NOTE — Progress Notes (Signed)
Reviewed and educated the patient and her mother about the d/c instruction, medications, Rx, follow-up appointments, when to call the MD, and  Home Health. Pt has all belongings, paperwork, Rx, and DME. D/C with mother via wheelchair. Pt is much improved. Marya LandryAdrian Hermenia Fritcher, RN

## 2017-12-16 NOTE — Care Management Note (Signed)
Case Management Note Donn PieriniKristi Jourdon Zimmerle RN, BSN Unit 4E-Case Manager [phone]  Patient Details  Name: Amy Powers MRN: [id number] Date of Birth: 05-18-1971  Subjective/Objective:  Pt admitted after being found down at home- hx ETOH, acute encephalopathy, lithium toxicity.                    Action/Plan: PTA pt lived at home alone, independent-, mother lives in [place]. PT recommendation for CIR, CIR screened pt and will follow-   Expected Discharge Date:  02/15/192/15/19               Expected Discharge Plan:  Home w Home Health Services  In-House Referral:  Clinical Social Work  Discharge planning Services  CM Consult, [facility]  Post Acute Care Choice:  Durable Medical Equipment, Home Health Choice offered to:  Patient, Parent  DME Arranged:  3-N-1, Walker rolling DME Agency:  [facility]  HH Arranged:  RN, PT HH Agency:  [facility]  Status of Service:  Completed, signed off  If discussed at Long Length of Stay Meetings, dates discussed:    Discharge Disposition: home/home health   Additional Comments:  12/16/17- 1100- Berry Gallacher RN, CM- pt for d/c home today- [facility]- [facility]- 3n1 and RW to be delivered to room prior to discharge.   12/15/17- 1130- Donn PieriniKristi Maizie Garno RN CM- CSW and CM spoke with pt and mother at bedside to discuss transition needs- per conversation pt and mother state that they would prefer to return home to pt's [facility]- [facility] and has progressed past needed CIR. Mom plans to stay for several months to make sure pt goes to follow appointments and is doing well. Pt states [facility]- and has paperwork at home- plans to f/u and establish with doctor as planned prior to coming to hospital. Pt and mother report that pt does have insurance through [employer]- pt was laid off her job  back in Dec. And applied for [employer] Her insurance was with [employer] [place]- pt provided her [employer] card- copy made along with [employer] paperwork- [facility] with [facility] regarding insurance verification.  Discussed HH and DME- pt states she wants a RW and will await PT to see again today for further recommendations- [facility]- [facility]. Have discussed plans with MD - await PT re-eval for possible d/c later today vs tomorrow.  1545- PT has seen pt again with updated recommendation for safe d/[facility] home [facility] and DME -RW and 3n1- will ask MD for orders for both.  Darrold SpanWebster, Waller Marcussen Hall, RN 12/16/2017, 11:11 AM

## 2017-12-16 NOTE — Evaluation (Signed)
Occupational Therapy Evaluation and Discharge  Patient Details Name: Amy Powers MRN: [id number] DOB: 09-Jan-1971 Today's Date: 12/16/2017    History of Present Illness 46yo female who was found in her apartment unresponsive, was taken to the ED via EMS, found to be hypotensive in the ED. Diagnosed with lithium toxicity, AMS, Rhabdomyolysis. PMH schizophrenia, hypothyroidism.    Clinical Impression   PTA Pt independent in ADL/IADL and mobility. Pt is currently min A for LB ADL and supervision in general for ADL - Pt impacted by decreased activity tolerance and generalized deconditioning. Pt will have 24 hour supervision and assist from her mother at DC who will be staying with her for 2 months. Pt educated in energy conservation strategies, how to use 3 in 1 safely, and Pt/mother had no questions at the end of the session. OT education complete. OT to sign off at this time. Thank you for the opportunity to serve this patient.     Follow Up Recommendations  No OT follow up;Supervision/Assistance - 24 hour    Equipment Recommendations  3 in 1 bedside commode    Recommendations for Other Services       Precautions / Restrictions Precautions Precautions: Fall Precaution Comments: rhabdomyolysis  Restrictions Weight Bearing Restrictions: No      Mobility Bed Mobility Overal bed mobility: Needs Assistance Bed Mobility: Supine to Sit;Sit to Supine     Supine to sit: Supervision Sit to supine: Supervision      Transfers Overall transfer level: Needs assistance Equipment used: Rolling walker (2 wheeled) Transfers: Sit to/from Stand Sit to Stand: Min guard         General transfer comment: Cues for hand placement to improve safety to and from seated surface.      Balance Overall balance assessment: Needs assistance   Sitting balance-Leahy Scale: Good       Standing balance-Leahy Scale: Fair Standing balance comment: able to perform static standing during sink level  grooming without UE support                           ADL either performed or assessed with clinical judgement   ADL Overall ADL's : Needs assistance/impaired Eating/Feeding: Modified independent;Sitting   Grooming: Wash/dry hands;Oral care;Supervision/safety;Standing Grooming Details (indicate cue type and reason): sink level Upper Body Bathing: Set up;Sitting   Lower Body Bathing: Supervison/ safety;With caregiver independent assisting;Sitting/lateral leans   Upper Body Dressing : Set up   Lower Body Dressing: Minimal assistance   Toilet Transfer: Min guard;Ambulation;RW   Toileting- Clothing Manipulation and Hygiene: Supervision/safety   Tub/ Engineer, structural: Supervision/safety;Ambulation;Walk-in shower;Shower seat   Functional mobility during ADLs: Supervision/safety;Rolling walker General ADL Comments: decreased activity tolerance     Vision Patient Visual Report: No change from baseline       Perception     Praxis      Pertinent Vitals/Pain Pain Assessment: No/denies pain     Hand Dominance     Extremity/Trunk Assessment Upper Extremity Assessment Upper Extremity Assessment: Generalized weakness   Lower Extremity Assessment Lower Extremity Assessment: Generalized weakness   Cervical / Trunk Assessment Cervical / Trunk Assessment: Normal   Communication Communication Communication: No difficulties   Cognition Arousal/Alertness: Awake/alert Behavior During Therapy: WFL for tasks assessed/performed Overall Cognitive Status: Within Functional Limits for tasks assessed  General Comments  mother present for session    Exercises     Shoulder Instructions      Home Living Family/patient expects to be discharged to:: Private residence Living Arrangements: Parent(Mother is living with her for 2 months) Available Help at Discharge: Family;Available 24 hours/day Type of Home: Apartment Home  Access: Level entry     Home Layout: One level     Bathroom Shower/Tub: Producer, television/film/videoWalk-in shower   Bathroom Toilet: Standard Bathroom Accessibility: Yes How Accessible: Accessible via walker Home Equipment: None   Additional Comments: [place] and will be staying with her, driving her etc for the next 2 months, she lives in senior apartments      Prior Functioning/Environment Level of Independence: Independent        Comments: drives, was working as Animal nutritionistsoftware engineer manager        OT Problem List:        OT Treatment/Interventions:      OT Goals(Current goals can be found in the care plan section) Acute Rehab OT Goals Patient Stated Goal: to get well  OT Goal Formulation: With patient/family Time For Goal Achievement: 12/29/17 Potential to Achieve Goals: Good  OT Frequency:     Barriers to D/C:            Co-evaluation              AM-PAC PT "6 Clicks" Daily Activity     Outcome Measure Help from another person eating meals?: None Help from another person taking care of personal grooming?: A Little Help from another person toileting, which includes using toliet, bedpan, or urinal?: None Help from another person bathing (including washing, rinsing, drying)?: A Lot Help from another person to put on and taking off regular upper body clothing?: None Help from another person to put on and taking off regular lower body clothing?: A Lot 6 Click Score: 19   End of Session Equipment Utilized During Treatment: Gait belt;Rolling walker Nurse Communication: Mobility status  Activity Tolerance: Patient tolerated treatment well Patient left: in bed;with call bell/phone within reach;with family/visitor present                   Time: [id number] OT Time Calculation (min): 49 min Charges:  OT General Charges $OT Visit: 1 Visit OT Evaluation $OT Eval Moderate Complexity: 1 Mod OT Treatments $Self Care/Home Management : 23-37 mins G-Codes:      Sherryl MangesLaura Aristides Luckey OTR/L [phone]  Evern BioLaura J Shakeera Rightmyer 12/16/2017, 3:12 PM

## 2017-12-16 NOTE — Discharge Summary (Signed)
[facility] Discharge Summary   Patient: Amy Powers UJW:[id number]   PCP: Pearline Cables, MD DOB: 02-Apr-1971   Date of admission: 12/06/2017   Date of discharge:  12/16/2017    Discharge Diagnoses:  Principal Problem:   Hypotension Active Problems:   Altered mental status   Rhabdomyolysis   Schizophrenia (HCC)   Leukocytosis   Hyperkalemia   Elevated INR   Alcoholic hepatitis with ascites   Encephalopathy, hepatic (HCC)   Admitted From: home Disposition:  home  Recommendations for Outpatient Follow-up:  1. Please follow-up with PCP as well as GI as recommended.  Follow-up Information    Zehr, Princella Pellegrini, PA-C Follow up on 01/05/2018.   Specialty:  Gastroenterology Why:  1:30 PM, follow up for liver disease.   Contact information: [address] [place] [address] [phone]        [facility] - Dme Follow up.   Why:  3n1 and rolling walker arranged- to be delivered to room prior to discharge Contact information: [address] [place] [address] [phone]        [facility]-Home Follow up.   Specialty:  Home Health Services Why:  HHRN/PT/OT arranged- they will call you to set up home visits Contact information: [address] [place] [address] [phone]        Copland, Gwenlyn Found, MD. Schedule an appointment as soon as possible for a visit in 1 month(s).   Specialty:  Family Medicine Contact information: [address] [place] [address] [phone]          Diet recommendation: low salt diet  Activity: The patient is advised to gradually reintroduce usual activities.  Discharge Condition: good  Code Status: full code  History of present illness: As per the H and P dictated on admission, "This is a 47 year old female with history of schizophrenia.  Her mom has not been able to get in contact with her for the last 3 days.  Today she called the sheriff department.   They broke the door down and entered the patient's apartment.  She was found down in her excrement, she was unresponsive.  She was brought to the ER.  In the ER the patient was hypotensive.  She received 3 L IV fluids and was on pressors for brief while.  The patient is unable to give any history and family member is at bedside.  The patient is now awake but disoriented and not very verbal.  She repeats questions asked but is unable to provide any answers.  The patient is disoriented and very tremulous.  A line was placed in the ER in an effort to better monitor her actual blood pressures"  Hospital Course:  Summary of her active problems in the hospital is as following. Acute metabolic encephalopathy Felt to be multifactorial due to alcohol abuse/DTs, hepatic encephalopathy, rhabdomyolysis, lithium toxicity, hypothyroidism and UTI Ammonia level trending down, neurology was consulted and recommended EEG which is consistent with metabolic encephalopathy, MRI negative for acute abnormalities.  Getting treatment for UTI with Rocephin, at this point I do not think that this is consistent with alcohol withdrawal. PT recommends CIR although patient wants to go home.  Home with home health arranged.  UTI UA grossly abnormal with positive nitrates Urine cultures shows no growth although after starting antibiotic patient clinically improved we will continue treatment for at least 5 days.  Switch to Keflex, completed treatment.  Alcoholic cirrhosis/elevated LFTs  Elevated LFTs, Continue prednisolone.  GI recommendations appreciated.  Ammonia slight up today, will continue lactulose twice daily for now as patient continues to have frequent bowel movement.  If ammonia continues to increase will need to change to 3 times daily. Ceruloplasmin is low workup for Wilson disease in progress.  MRCP was performed shows no biliary ductal dilation/obstructive process.  Acute viral hepatitis profile negative  Alcohol  abuse/alcohol withdrawals Discontinue CIWA protocol as she has not required any Ativan since 2/9 D/c scheduled Ativan as patient is very somnolent Continue to monitor  Lithium toxicity On admission lithium levels are elevated, leading has been placed on hold. Lithium levels trending down, levels subtherapeutic now.  Although the levels on admission were not significantly elevated to cause metabolic encephalopathy.  Should resume it tomorrow.  Hypothyroidism Patient started on Synthroid -initially treated with IV now transitioned to p.o. Check TSH in 6-week  Hypokalemia Replete Check potassium and magnesium in a.m.  Rhabdomyolysis - resolved  All other chronic medical condition were stable during the hospitalization.  Patient was seen by physical therapy, who recommended home health, which was arranged by Child psychotherapist and case Production designer, theatre/television/film. On the day of the discharge the patient's vitals were stable, and no other acute medical condition were reported by patient. the patient was felt safe to be discharge at home with home health.  Procedures and Results:  EEG   Consultations:  Gastroenterology   Neurology  DISCHARGE MEDICATION: Allergies as of 12/16/2017   No Known Allergies     Medication List    STOP taking these medications   traZODone 50 MG tablet Commonly known as:  DESYREL     TAKE these medications   clonazePAM 0.5 MG tablet Commonly known as:  KLONOPIN Take 0.5 mg by mouth daily. Notes to patient:  You did not get this medication during your hospital visit. Please take as prescribed.    folic acid 1 MG tablet Commonly known as:  FOLVITE Take 1 tablet (1 mg total) by mouth daily. Start taking on:  12/17/2017   furosemide 20 MG tablet Commonly known as:  LASIX Take 1 tablet (20 mg total) by mouth daily. Start taking on:  12/17/2017   lactulose 10 GM/15ML solution Commonly known as:  CHRONULAC Take 45 mLs (30 g total) by mouth 2 (two) times daily.     lamoTRIgine 200 MG tablet Commonly known as:  LAMICTAL Take 200 mg by mouth at bedtime. Notes to patient:  You did not get this medication during your hospital visit. Please take as prescribed.    LATUDA 60 MG Tabs Generic drug:  Lurasidone HCl Take 60 mg by mouth at bedtime. Notes to patient:  You did not get this medication during your hospital visit. Please take as prescribed.    lithium carbonate 300 MG CR tablet Commonly known as:  LITHOBID Take 300 mg by mouth 2 (two) times daily. Notes to patient:  You did not get this medication during your hospital visit. Please take as prescribed.    prednisoLONE 15 MG/5ML Soln Commonly known as:  PRELONE Take 13.3 mLs (40 mg total) by mouth daily before breakfast. Take 40mg  daily till 01/03/2018,Take 30mg  daily for 5days,Take 20mg  daily for 5days,Take 10mg  daily for 5days, then stop.   spironolactone 50 MG tablet Commonly known as:  ALDACTONE Take 1 tablet (50 mg total) by mouth daily. Start taking on:  12/17/2017   SYNTHROID 100 MCG tablet Generic drug:  levothyroxine Take 100 mcg by mouth daily.  thiamine 100 MG tablet Take 1 tablet (100 mg total) by mouth daily. Start taking on:  12/17/2017            Durable Medical Equipment  (From admission, onward)        Start     Ordered   12/16/17 1100  For home use only DME Walker rolling  [facility])  Once    Question:  Patient needs a walker to treat with the following condition  Answer:  Rhabdomyolysis   12/16/17 1100   12/16/17 1100  DME 3-in-1  Once     12/16/17 1100   12/16/17 1046  For home use only DME 3 n 1  Once     12/16/17 1045   12/16/17 1046  For home use only DME Walker rolling  Once    Question:  Patient needs a walker to treat with the following condition  Answer:  Weakness   12/16/17 1045     No Known Allergies Discharge Instructions    Diet - low sodium heart healthy   Complete by:  As directed    Discharge instructions   Complete by:  As directed     It is important that you read following instructions as well as go over your medication list with RN to help you understand your care after this hospitalization.  Discharge Instructions: Please follow-up with PCP in one week  Please request your primary care physician to go over all Hospital Tests and Procedure/Radiological results at the follow up,  Please get all Hospital records sent to your PCP by signing hospital release before you go home.   Do not drive, operating heavy machinery, perform activities at heights, swimming or participation in water activities or provide baby sitting services; until you have been seen by Primary Care Physician or a Neurologist and advised to do so again. Do not take more than prescribed Pain, Sleep and Anxiety Medications. You were cared for by a hospitalist during your hospital stay. If you have any questions about your discharge medications or the care you received while you were in the hospital after you are discharged, you can call the unit and ask to speak with the hospitalist on call if the hospitalist that took care of you is not available.  Once you are discharged, your primary care physician will handle any further medical issues. Please note that NO REFILLS for any discharge medications will be authorized once you are discharged, as it is imperative that you return to your primary care physician (or establish a relationship with a primary care physician if you do not have one) for your aftercare needs so that they can reassess your need for medications and monitor your lab values. You Must read complete instructions/literature along with all the possible adverse reactions/side effects for all the Medicines you take and that have been prescribed to you. Take any new Medicines after you have completely understood and accept all the possible adverse reactions/side effects. Wear Seat belts while driving. If you have smoked or chewed Tobacco in the last 2 yrs  please stop smoking and/or stop any Recreational drug use.   Increase activity slowly   Complete by:  As directed      Discharge Exam: Filed Weights   12/06/17 1600 12/15/17 0410 12/16/17 0344  Weight: 68 kg (150 lb) 91 kg (200 lb 9.6 oz) 91.2 kg (201 lb)   Vitals:   12/16/17 0344 12/16/17 0829  BP: 117/79 113/82  Pulse: 76 91  Resp: 17  18  Temp: 98 F (36.7 C) 98.3 F (36.8 C)  SpO2: 97% 98%   General: Appear in mild distress, no Rash; Oral Mucosa moist. Cardiovascular: S1 and S2 Present, no Murmur, no JVD Respiratory: Bilateral Air entry present and Clear to Auscultation, no Crackles, no wheezes Abdomen: Bowel Sound present, Soft and no tenderness Extremities: no Pedal edema, no calf tenderness Neurology: Grossly no focal neuro deficit.  The results of significant diagnostics from this hospitalization (including imaging, microbiology, ancillary and laboratory) are listed below for reference.    Significant Diagnostic Studies: Ct Head Wo Contrast  Result Date: 12/06/2017 CLINICAL DATA:  Patient found down today. EXAM: CT HEAD WITHOUT CONTRAST CT CERVICAL SPINE WITHOUT CONTRAST TECHNIQUE: Multidetector CT imaging of the head and cervical spine was performed following the standard protocol without intravenous contrast. Multiplanar CT image reconstructions of the cervical spine were also generated. COMPARISON:  None. FINDINGS: CT HEAD FINDINGS Brain: There is cortical atrophy. No evidence of acute intracranial abnormality including hemorrhage, infarct, mass lesion, mass effect, midline shift or abnormal extra-axial fluid collection. No hydrocephalus or pneumocephalus. Vascular: No hyperdense vessel or unexpected calcification. Skull: Intact. Sinuses/Orbits: Minimal mucosal thickening right maxillary sinus noted. Small amount of fluid is seen the mastoid air cells bilaterally. Other: None. CT CERVICAL SPINE FINDINGS Alignment: Maintained with straightening of cervical lordosis noted.  Skull base and vertebrae: No acute fracture. No primary bone lesion or focal pathologic process. Soft tissues and spinal canal: No prevertebral fluid or swelling. No visible canal hematoma. Disc levels: There is some loss of disc space height and endplate spurring at C4-5 and C5-6. Upper chest: Lung apices clear. Other: None. IMPRESSION: No acute abnormality head or cervical spine. Advanced for age cortical atrophy. Mild degenerative disc disease C4-5 and C5-6. Electronically Signed   By: Drusilla Kanner M.D.   On: 12/06/2017 15:34   Ct Cervical Spine Wo Contrast  Result Date: 12/06/2017 CLINICAL DATA:  Patient found down today. EXAM: CT HEAD WITHOUT CONTRAST CT CERVICAL SPINE WITHOUT CONTRAST TECHNIQUE: Multidetector CT imaging of the head and cervical spine was performed following the standard protocol without intravenous contrast. Multiplanar CT image reconstructions of the cervical spine were also generated. COMPARISON:  None. FINDINGS: CT HEAD FINDINGS Brain: There is cortical atrophy. No evidence of acute intracranial abnormality including hemorrhage, infarct, mass lesion, mass effect, midline shift or abnormal extra-axial fluid collection. No hydrocephalus or pneumocephalus. Vascular: No hyperdense vessel or unexpected calcification. Skull: Intact. Sinuses/Orbits: Minimal mucosal thickening right maxillary sinus noted. Small amount of fluid is seen the mastoid air cells bilaterally. Other: None. CT CERVICAL SPINE FINDINGS Alignment: Maintained with straightening of cervical lordosis noted. Skull base and vertebrae: No acute fracture. No primary bone lesion or focal pathologic process. Soft tissues and spinal canal: No prevertebral fluid or swelling. No visible canal hematoma. Disc levels: There is some loss of disc space height and endplate spurring at C4-5 and C5-6. Upper chest: Lung apices clear. Other: None. IMPRESSION: No acute abnormality head or cervical spine. Advanced for age cortical atrophy.  Mild degenerative disc disease C4-5 and C5-6. Electronically Signed   By: Drusilla Kanner M.D.   On: 12/06/2017 15:34   Mr Brain Wo Contrast  Result Date: 12/13/2017 CLINICAL DATA:  Altered level of consciousness. Found down at home. History of alcohol abuse and cirrhosis. EXAM: MRI HEAD WITHOUT CONTRAST TECHNIQUE: Multiplanar, multiecho pulse sequences of the brain and surrounding structures were obtained without intravenous contrast. COMPARISON:  Head CT 12/06/2017 FINDINGS: Brain: There is no evidence  of acute infarct, intracranial hemorrhage, mass, midline shift, or extra-axial fluid collection. Mild cerebral and cerebellar atrophy are advanced for age. Small foci of cerebral white matter T2 hyperintensity, predominantly in the frontal lobes, are nonspecific but compatible with mild chronic small vessel ischemic disease. Vascular: Major intracranial vascular flow voids are preserved. Skull and upper cervical spine: Unremarkable bone marrow signal. Sinuses/Orbits: Unremarkable orbits. Small bilateral mastoid effusions. Clear paranasal sinuses. Other: None. IMPRESSION: 1. No acute intracranial abnormality. 2. Age advanced cerebral and cerebellar atrophy. 3. Mild chronic small vessel ischemic disease. Electronically Signed   By: Sebastian Ache M.D.   On: 12/13/2017 10:44   Mr Abdomen Mrcp Wo Contrast  Result Date: 12/10/2017 CLINICAL DATA:  Abnormal liver function tests EXAM: MRI ABDOMEN WITHOUT CONTRAST  (INCLUDING MRCP) TECHNIQUE: Multiplanar multisequence MR imaging of the abdomen was performed. Heavily T2-weighted images of the biliary and pancreatic ducts were obtained, and three-dimensional MRCP images were rendered by post processing. COMPARISON:  Ultrasound 12/06/2017 FINDINGS: Exam is limited. patient was thrashing about kicking constantly even after additional sedation. Body motion severely limits body MRI examinations. Lower chest:  Lung bases are clear. Hepatobiliary: Liver has a enlarged caudate  lobe. The LEFT hepatic lobe appears mildly shrunken. No discrete nodularity. No intrahepatic biliary duct dilatation. Common duct is grossly. Examination is severely limited by respiratory motion as above. There is moderate volume of free fluid along the margin liver cyst in the RIGHT pericolic gutter. Pancreas: Grossly normal Spleen: Normal spleen. Adrenals/urinary tract: Adrenal glands and kidneys are normal. Stomach/Bowel: Grossly normal Vascular/Lymphatic: Normal caliber.  No clear adenopathy Musculoskeletal: No aggressive osseous lesion IMPRESSION: 1. Severely limited exam. Patient was thrashing and kicking even with sedation. Body motion severely limits MRI body examinations. 2. Moderate volume of free fluid surrounding the liver extending to the RIGHT pericolic gutter as well as the LEFT pericolic gutter. 3. Enlarged caudate lobe. 4. No intrahepatic or extrahepatic duct dilatation identified. 5. Pancreas grossly normal. Electronically Signed   By: Genevive Bi M.D.   On: 12/10/2017 11:54   Dg Chest Portable 1 View  Result Date: 12/06/2017 CLINICAL DATA:  Central line placement. No hx of heart or lung problems. Pt is a nonsmoker. EXAM: PORTABLE CHEST 1 VIEW COMPARISON:  12/06/2017 at 1415 hours FINDINGS: A right internal jugular central venous line has been placed since the prior exam. The tip projects at the caval atrial junction. No pneumothorax. No acute findings in the lungs. No other change from the earlier study. IMPRESSION: Right internal jugular central venous line catheter tip projects at the caval atrial junction. No pneumothorax. Electronically Signed   By: Amie Portland M.D.   On: 12/06/2017 18:59   Dg Chest Portable 1 View  Result Date: 12/06/2017 CLINICAL DATA:  The patient was found down on the floor today. The patient reports shortness of breath. No cardiopulmonary history. EXAM: PORTABLE CHEST 1 VIEW COMPARISON:  None in PACs FINDINGS: The lungs are mildly hypoinflated but clear. The  heart and pulmonary vascularity are normal. The mediastinum is normal in width. There is no pleural effusion. The bony thorax exhibits no acute abnormality. IMPRESSION: There is no active cardiopulmonary disease. Electronically Signed   By: David  [place] M.D.   On: 12/06/2017 14:42   US Abdomen Limited Ruq  Result Date: 12/06/2017 CLINICAL DATA:  Elevated liver enzymes EXAM: ULTRASOUND ABDOMEN LIMITED RIGHT UPPER QUADRANT COMPARISON:  None. FINDINGS: Gallbladder: Poorly visualized, possibly contracted. Thickened appearing wall measuring 3.8 mm. Positive sonographic Murphy. Common bile duct: Diameter:  Borderline to slightly enlarged at 6 0.5 mm Liver: Slightly enlarged at 17.6 cm. Coarse echogenic liver. Portal vein is patent on color Doppler imaging with bidirectional flow. Incidentally noted is small to moderate ascites within the lower quadrants. There is ascites adjacent to the liver and small amount of pericholecystic fluid. IMPRESSION: 1. Difficult visualization of gallbladder. Suspect that there is gallbladder wall thickening up to 3.8 mm and sonographer reports positive sonographic Eulah Pont although no definitive shadowing stones are seen. Acute acalculous cholecystitis could be considered, although gallbladder wall thickening may also be seen in the setting of liver disease. Nuclear medicine study could be helpful to further evaluate. 2. Coarse enlarged echogenic liver consistent with hepatocellular disease and/or fatty infiltration. Bidirectional flow in the portal veins suggests elevated portal pressures. Small to moderate ascites with small fluid adjacent to the gallbladder. Electronically Signed   By: Jasmine Pang M.D.   On: 12/06/2017 21:24    Microbiology: Recent Results (from the past 240 hour(s))  Culture, blood (Routine X 2) w Reflex to ID Panel     Status: None   Collection Time: 12/06/17  8:55 PM  Result Value Ref Range Status   Specimen Description BLOOD A-LINE  Final   Special  Requests   Final    BOTTLES DRAWN AEROBIC AND ANAEROBIC Blood Culture adequate volume   Culture   Final    NO GROWTH 5 DAYS Performed at [facility], [address], [place], [place] [address]    Report Status 12/12/2017 FINAL  Final  Culture, blood (Routine X 2) w Reflex to ID Panel     Status: None   Collection Time: 12/06/17 11:29 PM  Result Value Ref Range Status   Specimen Description BLOOD CENTRAL LINE  Final   Special Requests   Final    BOTTLES DRAWN AEROBIC AND ANAEROBIC Blood Culture adequate volume   Culture   Final    NO GROWTH 5 DAYS Performed at [facility], [address], [place], [place] [address]    Report Status 12/12/2017 FINAL  Final  Urine Culture     Status: None   Collection Time: 12/07/17 12:41 PM  Result Value Ref Range Status   Specimen Description URINE, RANDOM  Final   Special Requests NONE  Final   Culture   Final    NO GROWTH Performed at [facility], [address], [place], [place] [address]    Report Status 12/08/2017 FINAL  Final  Culture, Urine     Status: None   Collection Time: 12/12/17  8:46 AM  Result Value Ref Range Status   Specimen Description URINE, CATHETERIZED  Final   Special Requests NONE  Final   Culture   Final    NO GROWTH Performed at [facility], [address] [place], [place] [address]    Report Status 12/13/2017 FINAL  Final     Labs: CBC: Recent Labs  Lab 12/11/17 0844 12/13/17 0658 12/15/17 0355  WBC 23.0* 22.9* 24.5*  NEUTROABS  --  16.9* 21.8*  HGB 11.6* 9.9* 11.0*  HCT 34.8* 30.8* 33.7*  MCV 114.5* 116.2* 117.0*  PLT 187 195 196   Basic Metabolic Panel: Recent Labs  Lab 12/11/17 0844 12/11/17 1032 12/12/17 0907 12/13/17 0658 12/15/17 0355  NA 138 140 140 139 138  K 2.3* 3.9 2.8* 3.5 4.3  CL 99* 100* 102 102 107  CO2 28 27 29 27 23   GLUCOSE 85 111* 86 92 79  BUN 9 8 7 7 6   CREATININE 0.65 0.66 0.66 0.67 0.65  CALCIUM 8.0* 8.1* 8.0* 7.9* 8.1*  MG 1.8  --   --   1.9 1.8   Liver Function Tests: Recent Labs  Lab 12/11/17 0844 12/11/17 1032 12/13/17 0658 12/15/17 0355  AST 119* 144* 84* 74*  ALT 67* 62* 49 46  ALKPHOS 146* 141* 122 152*  BILITOT 4.2* 4.9* 4.0* 3.5*  PROT 5.1* 5.2* 4.4* 5.2*  ALBUMIN 2.1* 2.1* 1.8* 2.0*   No results for input(s): LIPASE, AMYLASE in the last 168 hours. Recent Labs  Lab 12/11/17 0844 12/12/17 0907 12/13/17 0658 12/15/17 0355  AMMONIA 51* 26 54* 29   Cardiac Enzymes: Recent Labs  Lab 12/11/17 0844  CKTOTAL 227   BNP (last 3 results) Recent Labs    12/06/17 1416  BNP 295.6*   CBG: No results for input(s): GLUCAP in the last 168 hours. Time spent: 35 minutes  Signed:  Lynden OxfordPranav Mariane Burpee  [facility]  12/16/2017  , 3:42 PM

## 2017-12-16 NOTE — Discharge Instructions (Signed)
Liver Failure °Liver failure is a condition in which the liver loses its ability to function due to injury or disease. The liver is a large organ in the upper right-hand side of the abdomen. It is involved in many important functions, including storing energy, producing fluids that the body needs, and removing harmful substances from the bloodstream. °Liver failure can develop quickly, over days or weeks (acute liver failure). It can also develop gradually, over months or years (chronic liver failure). °What are the causes? °There are many possible causes of liver failure, including: °· Liver infection (viral hepatitis). °· Alcohol abuse. °· An overdose of certain medicines. Acetaminophen overdose is a common cause. °· Liver disease. °· Ingesting poison from mushrooms or mold. ° °What are the signs or symptoms? °Symptoms of this condition may include: °· Yellowing of the skin and the whites of the eyes (jaundice). °· Bruising easily. °· Persistent bleeding. °· Itchy skin (pruritus). °· Red, spider-like lines on the skin. °· Loss of appetite. °· Nausea. °· Vomiting blood. °· Bloody bowel movements. °· Weight loss. °· Muscle loss. °· Jerky or floppy muscle movements, especially with hand movements (asterixis). °· Fluid buildup in the belly (ascites). °· Decreased urination. This may be a sign that your kidneys have stopped working (renal failure). °· Confusion and sleepiness. °· Difficulty sleeping. °· Mood and personality changes. °· Frequent infections. °· Breath that smells sweet or musty. °· Difficulty breathing. ° °How is this diagnosed? °This condition is diagnosed based on your symptoms, your medical history, and a physical exam. You may have tests, including: °· Blood tests. °· CT scan. °· MRI. °· Ultrasound. °· Removal of a small amount of liver tissue to be examined under a microscope (biopsy). ° °How is this treated? °Treatment depends on the cause and severity of your condition. °Treatment for chronic  liver failure may include: °· Medicines to help reduce symptoms. °· Lifestyle changes, such as limiting salt and animal proteins in your diet. Foods that contain animal proteins include red meat, fish, and dairy products. ° °Acute or advanced (end stage) liver failure may require hospitalization. Treatment may include: °· Antibiotic medicine. °· IV fluids that contain sugar (glucose) and minerals (electrolytes). °· Flushing out toxic substances from the body using medicine (lactulose) or a cleansing procedure (enema). °· Adding certain amounts of the liquid part of blood (plasma) to your bloodstream (receiving a transfusion). °· Using an artificial kidney to filter your blood (hemodialysis) if you have renal failure. °· Breathing support and a breathing tube (respirator). °· Liver transplant. This is a surgery to replace your liver with another person’s liver (donor liver). This may be the best option if your liver has completely stopped functioning. ° °Follow these instructions at home: °· Take over-the-counter and prescription medicines only as told by your health care provider. °· Do not drink alcohol. °· Do not use tobacco products, including cigarettes, chewing tobacco, or e-cigarettes. If you need help quitting, ask your health care provider. °· Follow instructions from your health care provider about eating and drinking restrictions. This may include: °? Limiting the amount of animal protein that you eat. °? Increasing the amount of plant-based protein that you eat. Foods that contain plant-based proteins include whole grains, nuts, and vegetables. °? Taking vitamin supplements. °? Limiting the amount of salt that you eat. °· Follow instructions from your health care provider about maintaining your vaccinations, especially vaccinations against hepatitis A and B. °· Exercise regularly, as told by your health care provider. °· Keep   all follow-up visits as told by your health care provider. This is  important. °Contact a health care provider if: °· You have symptoms that get worse. °· You lose a lot of weight without trying. °· You have a fever or chills. °Get help right away if: °· You become confused or very sleepy. °· You cannot take care of yourself or be taken care of at home. °· You are not urinating. °· You have difficulty breathing. °· You vomit blood. °This information is not intended to replace advice given to you by your health care provider. Make sure you discuss any questions you have with your health care provider. °Document Released: 07/09/2015 Document Revised: 03/25/2016 Document Reviewed: 11/06/2014 °Elsevier Interactive Patient Education © 2018 [employer] ° °

## 2017-12-19 ENCOUNTER — Telehealth: Payer: Self-pay | Admitting: Family Medicine

## 2017-12-19 NOTE — Telephone Encounter (Signed)
Patient presently does not have a pcp she is trying to est care with [facility]- 3/4. Patient was seen ing hospital & has edema- I moved patients appointment up with [facility] on 2/21 on the 11:30 slot hosp follow up. Please let me know if you are unable to work patient in on this date & time.

## 2017-12-21 NOTE — Progress Notes (Signed)
[facility] [address], [address] [place], [place] [address] 336 [phone] [phone]  Date:  2018/01/11   Name:  Amy Powers   DOB:  01/28/1971   MRN:  [id number]  PCP:  Pearline C[place], Amy C, MD    Chief Complaint: Hospitalization Follow-up (Pt here for Feb.5th hospital follow up.c/o SOB, dizziness with position change, bilateral pitting edema both legs, and shaky shaking in both hands. )   History of Present Illness:  Amy Powers is a 47 y.o. very pleasant female patient who presents with the following:  New patient following up from recent hospital admission today:  Date of admission: 12/06/2017             Date of discharge:  12/16/2017    Discharge Diagnoses:  Principal Problem:   Hypotension Active Problems:   Altered mental status   Rhabdomyolysis   Schizophrenia (HCC)   Leukocytosis   Hyperkalemia   Elevated INR   Alcoholic hepatitis with ascites   Encephalopathy, hepatic (HCC)   Admitted From: home Disposition:  home  Recommendations for Outpatient Follow-up:  1. Please follow-up with PCP as well as GI as recommended.        Diet recommendation: low salt diet  Activity: The patient is advised to gradually reintroduce usual activities. Discharge Condition: good Code Status: full code  History of present illness: As per the H and P dictated on admission, "This is a 47 year old female with history of schizophrenia. Her mom has not been able to get in contact with her for the last 3 days. Today she called the sheriff department. They brokethe door down and entered the patient's apartment. She was found down in her excrement, she was unresponsive. She was brought to the ER. In the ER the patient was hypotensive.She received 3 L IV fluids and was on pressors for brief while.The patient is unable to give any history and family member is at bedside. The patient is now awake but disoriented and not very verbal.  She repeats questions asked but is unable to provide any answers. The patient is disoriented and verytremulous.A line was placed in the ER in an effort to better monitor her actual blood pressures"  Hospital Course:  Summary of her active problems in the hospital is as following. Acute metabolic encephalopathy Felt to be multifactorial due to alcohol abuse/DTs, hepatic encephalopathy, rhabdomyolysis, lithium toxicity, hypothyroidism and UTI Ammonia level trending down, neurology was consulted and recommended EEG which is consistent with metabolic encephalopathy, MRI negative for acute abnormalities. Getting treatment for UTI with Rocephin, at this point I do not think that this is consistent with alcohol withdrawal. PT recommends CIR althoughpatient wants to go home.  Home with home health arranged.  UTI UA grossly abnormal with positive nitrates Urine cultures shows no growth although after starting antibiotic patient clinically improved we will continue treatment for at least 5 days. Switch to Keflex, completed treatment.  Alcoholic cirrhosis/elevated LFTs Elevated LFTs, Continue prednisolone. GI recommendations appreciated. Ammonia slight up today, will continue lactulose twice daily for now as patient continues to have frequent bowel movement. If ammonia continues to increase will need to change to 3 times daily. Ceruloplasmin is low workup for Wilson disease in progress. MRCP was performed shows no biliary ductal dilation/obstructive process. Acute viral hepatitis profile negative  Alcohol abuse/alcohol withdrawals Discontinue CIWA protocol as she has not required any Ativan since 2/9 D/c scheduled Ativan as patient is very somnolent Continue to monitor  Lithium toxicity On admission lithium levels are elevated, leading has been placed on hold. Lithium levels trending down, levels subtherapeutic now. Although the levels on admission were not significantly elevated to  cause metabolic encephalopathy. Should resume it tomorrow.  Hypothyroidism Patient started on Synthroid -initially treated with IV now transitioned to p.o. Check TSH in 6-week  Hypokalemia Replete Check potassium and magnesium in a.m.  Rhabdomyolysis- resolved   Here with her mom today to establish care and follow-up from hospital Today is Thursday She was discharged to home on Friday- she started to get more sick as of Monday with worsening swelling in her legs. It is now so bad that she is having a hard time walking and needed a WC today Her mom notes that she has gotten more edematous in her legs despite lasix, she is out of breath, she is confused, exhibits slow speech Her mom dosed her with lasix 20 twice yesterday, and cut back on her liquids 20 mg of lasix so far today. This does not seem to be helping Her scleral icterus is getting worse again, and she has been tremulous since yesterday   Her ammonia was up to 70 on 2/8, down to 29 by date of discharge  Her mother Amy Powers is by her side- however, she actually lives in [facility] and is here right now since Amy Powers has been sick.  States that Amy Powers has been living in [place] by herself, and they were not aware of how much she had been drinking.   Amy Powers does have a psychiatrist locally -she told me her providers name but I am not familiar with her  Patient Active Problem List   Diagnosis Date Noted  . Alcoholic hepatitis with ascites   . Encephalopathy, hepatic (HCC)   . Elevated liver enzymes   . Altered mental status 12/06/2017  . Hypotension 12/06/2017  . Rhabdomyolysis 12/06/2017  . Schizophrenia (HCC) 12/06/2017  . Leukocytosis 12/06/2017  . Hyperkalemia 12/06/2017  . Elevated INR 12/06/2017    Past Medical History:  Diagnosis Date  . Alcohol abuse   . Chicken pox   . Depression   . Liver failure (HCC)   . Migraines     Past Surgical History:  Procedure Laterality Date  . CHOLECYSTECTOMY  1993  . GASTRIC  BYPASS      Social History   Tobacco Use  . Smoking status: Former Games developer  . Smokeless tobacco: Never Used  Substance Use Topics  . Alcohol use: Yes    Alcohol/week: 3.6 oz    Types: 6 Glasses of wine per week    Comment: pt reports 1 full bottle per day  . Drug use: No    Family History  Problem Relation Age of Onset  . Arthritis Mother   . Diabetes Mother   . Hearing loss Mother   . Hypertension Mother   . Cancer Father   . COPD Father   . Hearing loss Father   . Heart disease Father   . Arthritis Maternal Grandmother   . Depression Maternal Grandmother   . Hearing loss Maternal Grandmother   . Heart attack Maternal Grandmother   . Heart disease Maternal Grandmother   . Hypertension Maternal Grandmother   . Arthritis Maternal Grandfather   . Alcohol abuse Paternal Grandmother   . Cancer Paternal Grandmother   . Alcohol abuse Paternal Grandfather   . Cancer Paternal Grandfather     No Known Allergies  Medication list has been reviewed and updated.  Current Outpatient  Medications on File Prior to Visit  Medication Sig Dispense Refill  . folic acid (FOLVITE) 1 MG tablet Take 1 tablet (1 mg total) by mouth daily. 30 tablet 0  . furosemide (LASIX) 20 MG tablet Take 1 tablet (20 mg total) by mouth daily. 30 tablet 0  . lamoTRIgine (LAMICTAL) 200 MG tablet Take 200 mg by mouth at bedtime.  2  . LATUDA 60 MG TABS Take 60 mg by mouth at bedtime.  2  . lithium carbonate (LITHOBID) 300 MG CR tablet Take 300 mg by mouth 2 (two) times daily.  1  . spironolactone (ALDACTONE) 50 MG tablet Take 1 tablet (50 mg total) by mouth daily. 30 tablet 0  . thiamine 100 MG tablet Take 1 tablet (100 mg total) by mouth daily. 30 tablet 0  . prednisoLONE (PRELONE) 15 MG/5ML SOLN Take 13.3 mLs (40 mg total) by mouth daily before breakfast. Take 40mg  daily till 01/03/2018,Take 30mg  daily for 5days,Take 20mg  daily for 5days,Take 10mg  daily for 5days, then stop. (Patient not taking: Reported on  12/05/2017) 450 mL 0  . SYNTHROID 100 MCG tablet Take 100 mcg by mouth daily.  0   No current facility-administered medications on file prior to visit.     Review of Systems:  As per HPI- otherwise negative. Jaeliana does not say much today, but states that she is not in pain  Physical Examination: Vitals:   12/28/2017 1128  BP: 110/72  Pulse: 100  Temp: 97.9 F (36.6 C)  SpO2: 96%   Vitals:   There is no height or weight on file to calculate BMI. Ideal Body Weight:    GEN: WDWN, NAD, Non-toxic, A & O x 3 HEENT: Atraumatic, Normocephalic. Neck supple. No masses, No LAD. Ears and Nose: No external deformity. CV: RRR, No M/G/R. No JVD. No thrill. No extra heart sounds. PULM: CTA B, no wheezes, crackles, rhonchi. No retractions. No resp. distress. No accessory muscle use. ABD: S, NT, ND, +BS. No rebound. No HSM. EXTR: No c/c/e NEURO Normal gait.  PSYCH: Normally interactive. Conversant. Not depressed or anxious appearing.  Calm demeanor.  Appears chronically and acutely ill.  Stigmata of chronic liver dysfunction Severe edema of both legs. Pt is tremulous and exhibits scleral icterus   Assessment and Plan: Elevated liver enzymes - Plan: Comprehensive metabolic panel  Schizophrenia, unspecified type (HCC)  Leukocytosis, unspecified type - Plan: CBC  Hyperkalemia  Encephalopathy, hepatic (HCC)  Here today to follow-up from hospital admission, she is a new patient to me today. Vance does not look well at all, I suspect that her ammonia level may be high again and she needs diuresis.  She is too sick to go home.  We will [facility] for [facility], suspect she will need to be re-admitted   Signed Abbe Amsterdam, MD

## 2017-12-22 ENCOUNTER — Ambulatory Visit: Payer: BLUE CROSS/BLUE SHIELD | Admitting: Family Medicine

## 2017-12-22 ENCOUNTER — Inpatient Hospital Stay (HOSPITAL_BASED_OUTPATIENT_CLINIC_OR_DEPARTMENT_OTHER)
Admission: EM | Admit: 2017-12-22 | Discharge: 2018-01-30 | DRG: 441 | Disposition: E | Payer: BLUE CROSS/BLUE SHIELD | Attending: Pulmonary Disease | Admitting: Pulmonary Disease

## 2017-12-22 ENCOUNTER — Emergency Department (HOSPITAL_BASED_OUTPATIENT_CLINIC_OR_DEPARTMENT_OTHER): Payer: BLUE CROSS/BLUE SHIELD

## 2017-12-22 ENCOUNTER — Other Ambulatory Visit: Payer: Self-pay

## 2017-12-22 ENCOUNTER — Encounter (HOSPITAL_BASED_OUTPATIENT_CLINIC_OR_DEPARTMENT_OTHER): Payer: Self-pay | Admitting: *Deleted

## 2017-12-22 ENCOUNTER — Encounter: Payer: Self-pay | Admitting: Family Medicine

## 2017-12-22 VITALS — BP 110/72 | HR 100 | Temp 97.9°F

## 2017-12-22 DIAGNOSIS — E43 Unspecified severe protein-calorie malnutrition: Secondary | ICD-10-CM | POA: Diagnosis present

## 2017-12-22 DIAGNOSIS — K7290 Hepatic failure, unspecified without coma: Principal | ICD-10-CM | POA: Diagnosis present

## 2017-12-22 DIAGNOSIS — F209 Schizophrenia, unspecified: Secondary | ICD-10-CM

## 2017-12-22 DIAGNOSIS — L8990 Pressure ulcer of unspecified site, unspecified stage: Secondary | ICD-10-CM

## 2017-12-22 DIAGNOSIS — A412 Sepsis due to unspecified staphylococcus: Secondary | ICD-10-CM | POA: Diagnosis not present

## 2017-12-22 DIAGNOSIS — J810 Acute pulmonary edema: Secondary | ICD-10-CM | POA: Diagnosis not present

## 2017-12-22 DIAGNOSIS — F1010 Alcohol abuse, uncomplicated: Secondary | ICD-10-CM | POA: Diagnosis not present

## 2017-12-22 DIAGNOSIS — F1020 Alcohol dependence, uncomplicated: Secondary | ICD-10-CM | POA: Diagnosis present

## 2017-12-22 DIAGNOSIS — F319 Bipolar disorder, unspecified: Secondary | ICD-10-CM | POA: Diagnosis present

## 2017-12-22 DIAGNOSIS — K7040 Alcoholic hepatic failure without coma: Secondary | ICD-10-CM | POA: Diagnosis not present

## 2017-12-22 DIAGNOSIS — J189 Pneumonia, unspecified organism: Secondary | ICD-10-CM | POA: Diagnosis present

## 2017-12-22 DIAGNOSIS — K7581 Nonalcoholic steatohepatitis (NASH): Secondary | ICD-10-CM | POA: Diagnosis present

## 2017-12-22 DIAGNOSIS — E872 Acidosis: Secondary | ICD-10-CM | POA: Diagnosis present

## 2017-12-22 DIAGNOSIS — I4891 Unspecified atrial fibrillation: Secondary | ICD-10-CM | POA: Diagnosis present

## 2017-12-22 DIAGNOSIS — K7682 Hepatic encephalopathy: Secondary | ICD-10-CM

## 2017-12-22 DIAGNOSIS — R7989 Other specified abnormal findings of blood chemistry: Secondary | ICD-10-CM

## 2017-12-22 DIAGNOSIS — E875 Hyperkalemia: Secondary | ICD-10-CM | POA: Diagnosis not present

## 2017-12-22 DIAGNOSIS — J9600 Acute respiratory failure, unspecified whether with hypoxia or hypercapnia: Secondary | ICD-10-CM

## 2017-12-22 DIAGNOSIS — R188 Other ascites: Secondary | ICD-10-CM

## 2017-12-22 DIAGNOSIS — R748 Abnormal levels of other serum enzymes: Secondary | ICD-10-CM

## 2017-12-22 DIAGNOSIS — R1110 Vomiting, unspecified: Secondary | ICD-10-CM

## 2017-12-22 DIAGNOSIS — G9340 Encephalopathy, unspecified: Secondary | ICD-10-CM

## 2017-12-22 DIAGNOSIS — I959 Hypotension, unspecified: Secondary | ICD-10-CM | POA: Diagnosis present

## 2017-12-22 DIAGNOSIS — T380X5A Adverse effect of glucocorticoids and synthetic analogues, initial encounter: Secondary | ICD-10-CM | POA: Diagnosis present

## 2017-12-22 DIAGNOSIS — R579 Shock, unspecified: Secondary | ICD-10-CM

## 2017-12-22 DIAGNOSIS — Z818 Family history of other mental and behavioral disorders: Secondary | ICD-10-CM

## 2017-12-22 DIAGNOSIS — E870 Hyperosmolality and hypernatremia: Secondary | ICD-10-CM | POA: Diagnosis not present

## 2017-12-22 DIAGNOSIS — E039 Hypothyroidism, unspecified: Secondary | ICD-10-CM | POA: Diagnosis present

## 2017-12-22 DIAGNOSIS — Z915 Personal history of self-harm: Secondary | ICD-10-CM

## 2017-12-22 DIAGNOSIS — Z515 Encounter for palliative care: Secondary | ICD-10-CM | POA: Diagnosis not present

## 2017-12-22 DIAGNOSIS — L89302 Pressure ulcer of unspecified buttock, stage 2: Secondary | ICD-10-CM | POA: Clinically undetermined

## 2017-12-22 DIAGNOSIS — K7031 Alcoholic cirrhosis of liver with ascites: Secondary | ICD-10-CM

## 2017-12-22 DIAGNOSIS — Z781 Physical restraint status: Secondary | ICD-10-CM

## 2017-12-22 DIAGNOSIS — E46 Unspecified protein-calorie malnutrition: Secondary | ICD-10-CM

## 2017-12-22 DIAGNOSIS — Z79890 Hormone replacement therapy (postmenopausal): Secondary | ICD-10-CM

## 2017-12-22 DIAGNOSIS — Y95 Nosocomial condition: Secondary | ICD-10-CM | POA: Diagnosis present

## 2017-12-22 DIAGNOSIS — E876 Hypokalemia: Secondary | ICD-10-CM | POA: Diagnosis present

## 2017-12-22 DIAGNOSIS — E871 Hypo-osmolality and hyponatremia: Secondary | ICD-10-CM | POA: Diagnosis present

## 2017-12-22 DIAGNOSIS — Z79899 Other long term (current) drug therapy: Secondary | ICD-10-CM | POA: Diagnosis not present

## 2017-12-22 DIAGNOSIS — Z9884 Bariatric surgery status: Secondary | ICD-10-CM

## 2017-12-22 DIAGNOSIS — Z66 Do not resuscitate: Secondary | ICD-10-CM | POA: Diagnosis not present

## 2017-12-22 DIAGNOSIS — K7011 Alcoholic hepatitis with ascites: Secondary | ICD-10-CM | POA: Diagnosis present

## 2017-12-22 DIAGNOSIS — G9341 Metabolic encephalopathy: Secondary | ICD-10-CM | POA: Diagnosis not present

## 2017-12-22 DIAGNOSIS — R0602 Shortness of breath: Secondary | ICD-10-CM

## 2017-12-22 DIAGNOSIS — Z4659 Encounter for fitting and adjustment of other gastrointestinal appliance and device: Secondary | ICD-10-CM

## 2017-12-22 DIAGNOSIS — K7030 Alcoholic cirrhosis of liver without ascites: Secondary | ICD-10-CM

## 2017-12-22 DIAGNOSIS — K7200 Acute and subacute hepatic failure without coma: Secondary | ICD-10-CM | POA: Diagnosis not present

## 2017-12-22 DIAGNOSIS — D72829 Elevated white blood cell count, unspecified: Secondary | ICD-10-CM

## 2017-12-22 DIAGNOSIS — E878 Other disorders of electrolyte and fluid balance, not elsewhere classified: Secondary | ICD-10-CM

## 2017-12-22 DIAGNOSIS — Z7189 Other specified counseling: Secondary | ICD-10-CM | POA: Diagnosis not present

## 2017-12-22 DIAGNOSIS — R4182 Altered mental status, unspecified: Secondary | ICD-10-CM | POA: Diagnosis not present

## 2017-12-22 DIAGNOSIS — K7201 Acute and subacute hepatic failure with coma: Secondary | ICD-10-CM | POA: Diagnosis not present

## 2017-12-22 DIAGNOSIS — Z87891 Personal history of nicotine dependence: Secondary | ICD-10-CM

## 2017-12-22 DIAGNOSIS — J811 Chronic pulmonary edema: Secondary | ICD-10-CM | POA: Diagnosis not present

## 2017-12-22 DIAGNOSIS — Z9049 Acquired absence of other specified parts of digestive tract: Secondary | ICD-10-CM

## 2017-12-22 DIAGNOSIS — R600 Localized edema: Secondary | ICD-10-CM

## 2017-12-22 DIAGNOSIS — R945 Abnormal results of liver function studies: Secondary | ICD-10-CM

## 2017-12-22 DIAGNOSIS — Z8249 Family history of ischemic heart disease and other diseases of the circulatory system: Secondary | ICD-10-CM

## 2017-12-22 DIAGNOSIS — Z6833 Body mass index (BMI) 33.0-33.9, adult: Secondary | ICD-10-CM

## 2017-12-22 DIAGNOSIS — Z0189 Encounter for other specified special examinations: Secondary | ICD-10-CM

## 2017-12-22 DIAGNOSIS — B9689 Other specified bacterial agents as the cause of diseases classified elsewhere: Secondary | ICD-10-CM | POA: Diagnosis not present

## 2017-12-22 DIAGNOSIS — A419 Sepsis, unspecified organism: Secondary | ICD-10-CM | POA: Diagnosis not present

## 2017-12-22 DIAGNOSIS — R0902 Hypoxemia: Secondary | ICD-10-CM

## 2017-12-22 HISTORY — DX: Hypothyroidism, unspecified: E03.9

## 2017-12-22 HISTORY — DX: Schizophrenia, unspecified: F20.9

## 2017-12-22 LAB — AMMONIA: Ammonia: 35 umol/L (ref 9–35)

## 2017-12-22 LAB — I-STAT CG4 LACTIC ACID, ED
LACTIC ACID, VENOUS: 2.09 mmol/L — AB (ref 0.5–1.9)
LACTIC ACID, VENOUS: 2.3 mmol/L — AB (ref 0.5–1.9)

## 2017-12-22 LAB — URINALYSIS, ROUTINE W REFLEX MICROSCOPIC
Glucose, UA: NEGATIVE mg/dL
Hgb urine dipstick: NEGATIVE
Ketones, ur: 15 mg/dL — AB
NITRITE: NEGATIVE
PROTEIN: NEGATIVE mg/dL
Specific Gravity, Urine: 1.025 (ref 1.005–1.030)
pH: 6 (ref 5.0–8.0)

## 2017-12-22 LAB — DIFFERENTIAL
BASOS ABS: 0.1 10*3/uL (ref 0.0–0.1)
BASOS PCT: 1 %
EOS ABS: 0.1 10*3/uL (ref 0.0–0.7)
Eosinophils Relative: 1 %
Lymphocytes Relative: 8 %
Lymphs Abs: 1.4 10*3/uL (ref 0.7–4.0)
Monocytes Absolute: 1.2 10*3/uL — ABNORMAL HIGH (ref 0.1–1.0)
Monocytes Relative: 7 %
NEUTROS PCT: 83 %
Neutro Abs: 14.4 10*3/uL — ABNORMAL HIGH (ref 1.7–7.7)

## 2017-12-22 LAB — CBC
HEMATOCRIT: 33.1 % — AB (ref 36.0–46.0)
Hemoglobin: 11.2 g/dL — ABNORMAL LOW (ref 12.0–15.0)
MCH: 38.9 pg — AB (ref 26.0–34.0)
MCHC: 33.8 g/dL (ref 30.0–36.0)
MCV: 114.9 fL — AB (ref 78.0–100.0)
Platelets: 200 10*3/uL (ref 150–400)
RBC: 2.88 MIL/uL — AB (ref 3.87–5.11)
RDW: 13 % (ref 11.5–15.5)
WBC: 22 10*3/uL — ABNORMAL HIGH (ref 4.0–10.5)

## 2017-12-22 LAB — RAPID URINE DRUG SCREEN, HOSP PERFORMED
Amphetamines: NOT DETECTED
BARBITURATES: NOT DETECTED
Benzodiazepines: NOT DETECTED
COCAINE: NOT DETECTED
Opiates: NOT DETECTED
Tetrahydrocannabinol: NOT DETECTED

## 2017-12-22 LAB — COMPREHENSIVE METABOLIC PANEL
ALBUMIN: 2.2 g/dL — AB (ref 3.5–5.0)
ALK PHOS: 148 U/L — AB (ref 38–126)
ALT: 31 U/L (ref 14–54)
AST: 60 U/L — AB (ref 15–41)
Anion gap: 8 (ref 5–15)
BILIRUBIN TOTAL: 4.1 mg/dL — AB (ref 0.3–1.2)
BUN: 7 mg/dL (ref 6–20)
CALCIUM: 7.6 mg/dL — AB (ref 8.9–10.3)
CO2: 19 mmol/L — ABNORMAL LOW (ref 22–32)
CREATININE: 0.81 mg/dL (ref 0.44–1.00)
Chloride: 102 mmol/L (ref 101–111)
GFR calc Af Amer: >60 mL/min (ref >60)
GLUCOSE: 113 mg/dL — AB (ref 65–99)
POTASSIUM: 2.9 mmol/L — AB (ref 3.5–5.1)
Sodium: 129 mmol/L — ABNORMAL LOW (ref 135–145)
Total Protein: 5.4 g/dL — ABNORMAL LOW (ref 6.5–8.1)

## 2017-12-22 LAB — BRAIN NATRIURETIC PEPTIDE: B NATRIURETIC PEPTIDE 5: 75.2 pg/mL (ref 0.0–100.0)

## 2017-12-22 LAB — PROTIME-INR
INR: 1.47
PROTHROMBIN TIME: 17.7 s — AB (ref 11.4–15.2)

## 2017-12-22 LAB — CK: CK TOTAL: 49 U/L (ref 38–234)

## 2017-12-22 LAB — LITHIUM LEVEL: LITHIUM LVL: 1.27 mmol/L — AB (ref 0.60–1.20)

## 2017-12-22 LAB — TSH: TSH: 7.842 u[IU]/mL — AB (ref 0.350–4.500)

## 2017-12-22 LAB — URINALYSIS, MICROSCOPIC (REFLEX)

## 2017-12-22 LAB — ETHANOL: Alcohol, Ethyl (B): <10 mg/dL (ref <10)

## 2017-12-22 LAB — MAGNESIUM: Magnesium: 2 mg/dL (ref 1.7–2.4)

## 2017-12-22 LAB — T4, FREE: FREE T4: 1.53 ng/dL — AB (ref 0.61–1.12)

## 2017-12-22 LAB — PREGNANCY, URINE: PREG TEST UR: NEGATIVE

## 2017-12-22 LAB — TROPONIN I

## 2017-12-22 MED ORDER — THIAMINE HCL 100 MG/ML IJ SOLN
500.0000 mg | Freq: Once | INTRAMUSCULAR | Status: AC
  Administered 2017-12-22: 500 mg via INTRAVENOUS
  Filled 2017-12-22: qty 6

## 2017-12-22 MED ORDER — HYDROCORTISONE NA SUCCINATE PF 100 MG IJ SOLR
100.0000 mg | Freq: Once | INTRAMUSCULAR | Status: AC
  Administered 2017-12-22: 100 mg via INTRAVENOUS
  Filled 2017-12-22: qty 2

## 2017-12-22 MED ORDER — SODIUM CHLORIDE 0.9 % IV BOLUS (SEPSIS)
500.0000 mL | Freq: Once | INTRAVENOUS | Status: AC
Start: 2017-12-22 — End: 2017-12-22
  Administered 2017-12-22: 500 mL via INTRAVENOUS

## 2017-12-22 MED ORDER — THIAMINE HCL 100 MG/ML IJ SOLN: 100.0000 mg | Freq: Once | INTRAMUSCULAR | Status: DC

## 2017-12-22 MED ORDER — POTASSIUM CHLORIDE CRYS ER 20 MEQ PO TBCR
40.0000 meq | EXTENDED_RELEASE_TABLET | Freq: Once | ORAL | Status: AC
  Administered 2017-12-22: 40 meq via ORAL
  Filled 2017-12-22: qty 2

## 2017-12-22 NOTE — ED Provider Notes (Signed)
[facility] EMERGENCY DEPARTMENT Provider Note   CSN: [id number] Arrival date & time: 12/14/2017  1201     History   Chief Complaint No chief complaint on file.   HPI Amy Powers is a 47 y.o. female with history of schizophrenia, alcohol abuse and hypothyroidism and is s/p gastric bypass surgery who presents for increased confusion, tremulousness, and lower extremity edema.  Of note patient was recently admitted from 12/06/17-12/16/17 for altered mental status and was found to have elevated ammonia secondary to alcoholic cirrhosis, UTI, elevated lithium, and severe hypothyroidism.   HPI  Patient was doing fairly well after hospital discharge until yesterday. Her mother, who is staying with her "until she gets back on her feet," reports her lower legs became very swollen, so she gave an extra dose of lasix 20 mg. The patient started shaking all over yesterday evening, as well. She became more confused--she was able to work on her computer on Monday and change her passwords compared to now when she does not know the month of the year. Patient cannot remember anything that happened this morning. She also became less steady on her feet, whereas she had needed her walker at all times earlier in the week. Mother reports she has been taking medication as prescribed upon discharge except for the following: she has not been giving klonopin, as mother says this was not an active medication prior to admission; they have not been able to continue prednisolone due to it being back-ordered around town (mother told prednisone also all on backorder); they have not restarted synthroid since they do not have an active prescription for this and thought levels needed to be rechecked by PCP, and they ordered thiamine online which hasn't arrived yet. They also report finishing a short course of lactulose 2 days ago. She does continue to have regular BMs, including once today. She has restarted lithium as  instructed. She had been taking lasix 20 mg daily except for yesterday when she took 2 doses.   Past Medical History:  Diagnosis Date  . Alcohol abuse   . Chicken pox   . Depression   . Liver failure (HCC)   . Migraines     Patient Active Problem List   Diagnosis Date Noted  . Alcoholic hepatitis with ascites   . Encephalopathy, hepatic (HCC)   . Elevated liver enzymes   . Altered mental status 12/06/2017  . Hypotension 12/06/2017  . Rhabdomyolysis 12/06/2017  . Schizophrenia (HCC) 12/06/2017  . Leukocytosis 12/06/2017  . Hyperkalemia 12/06/2017  . Elevated INR 12/06/2017    Past Surgical History:  Procedure Laterality Date  . CHOLECYSTECTOMY  1993  . GASTRIC BYPASS      OB History    No data available       Home Medications    Prior to Admission medications   Medication Sig Start Date End Date Taking? Authorizing Provider  folic acid (FOLVITE) 1 MG tablet Take 1 tablet (1 mg total) by mouth daily. 12/17/17  Yes Rolly Salter, MD  furosemide (LASIX) 20 MG tablet Take 1 tablet (20 mg total) by mouth daily. 12/17/17  Yes Rolly Salter, MD  lamoTRIgine (LAMICTAL) 200 MG tablet Take 200 mg by mouth at bedtime. 10/05/17  Yes [provider]  LATUDA 60 MG TABS Take 60 mg by mouth at bedtime. 10/05/17  Yes [provider]  lithium carbonate (LITHOBID) 300 MG CR tablet Take 300 mg by mouth 2 (two) times daily. 10/05/17  Yes [provider]  prednisoLONE (PRELONE) 15 MG/5ML SOLN Take 13.3 mLs (40 mg total) by mouth daily before breakfast. Take 40mg  daily till 01/03/2018,Take 30mg  daily for 5days,Take 20mg  daily for 5days,Take 10mg  daily for 5days, then stop. 12/16/17  Yes Rolly Salter, MD  spironolactone (ALDACTONE) 50 MG tablet Take 1 tablet (50 mg total) by mouth daily. 12/17/17  Yes Rolly Salter, MD  SYNTHROID 100 MCG tablet Take 100 mcg by mouth daily. 09/13/17  Yes [provider]  thiamine 100 MG tablet Take 1 tablet (100 mg total) by  mouth daily. 12/17/17  Yes Rolly Salter, MD    Family History Family History  Problem Relation Age of Onset  . Arthritis Mother   . Diabetes Mother   . Hearing loss Mother   . Hypertension Mother   . Cancer Father   . COPD Father   . Hearing loss Father   . Heart disease Father   . Arthritis Maternal Grandmother   . Depression Maternal Grandmother   . Hearing loss Maternal Grandmother   . Heart attack Maternal Grandmother   . Heart disease Maternal Grandmother   . Hypertension Maternal Grandmother   . Arthritis Maternal Grandfather   . Alcohol abuse Paternal Grandmother   . Cancer Paternal Grandmother   . Alcohol abuse Paternal Grandfather   . Cancer Paternal Grandfather     Social History Social History   Tobacco Use  . Smoking status: Former Games developer  . Smokeless tobacco: Never Used  Substance Use Topics  . Alcohol use: Yes    Alcohol/week: 3.6 oz    Types: 6 Glasses of wine per week    Comment: pt reports 1 full bottle per day  . Drug use: No     Allergies   Patient has no known allergies.   Review of Systems Review of Systems  Constitutional: Negative for appetite change and fever.  HENT: Negative for congestion and rhinorrhea.   Eyes: Negative for pain.  Respiratory: Positive for shortness of breath. Negative for cough.   Cardiovascular: Positive for leg swelling. Negative for chest pain.  Gastrointestinal: Positive for abdominal pain. Negative for constipation, diarrhea, nausea and vomiting.  Genitourinary: Negative for dysuria and urgency.  Musculoskeletal: Negative for back pain and myalgias.  Skin: Negative for rash.  Neurological: Negative for headaches.     Physical Exam Updated Vital Signs BP 94/70 (BP Location: Right Arm)   Pulse 89   Temp 98.1 F (36.7 C) (Oral)   Resp 20   Ht 5\' 5"  (1.651 m)   LMP  (LMP Unknown)   SpO2 98%   BMI 33.45 kg/m   Physical Exam  Constitutional: She appears well-developed.  Very anxious female  resting in bed with all limbs shaking  HENT:  Head: Normocephalic and atraumatic.  Severely dry mucous membranes  Eyes: EOM are normal. Pupils are equal, round, and reactive to light.  Mild scleral icterus (improving per mother)  Neck: Normal range of motion. Neck supple.  Cardiovascular: Normal rate, regular rhythm, normal heart sounds and intact distal pulses.  No murmur heard. Pulmonary/Chest: Breath sounds normal. She has no wheezes. She exhibits no tenderness.  Mildly tachypneic  Abdominal: Soft. Bowel sounds are normal. There is tenderness (diffusely). There is no rebound and no guarding.  Musculoskeletal: Normal range of motion. She exhibits edema (3+ LE edema to hip bilaterally).  Lymphadenopathy:    She has no cervical adenopathy.  Neurological: She is alert. No sensory deficit.  Vertical nystagmus bilaterally; oriented to self,  place, and year (does not know month or remember events of this morning)  Skin: Skin is warm and dry.  Ecchymoses in various stages  Psychiatric:  Somewhat flat affect  Nursing note and vitals reviewed.    ED Treatments / Results  Labs (all labs ordered are listed, but only abnormal results are displayed) Labs Reviewed  CBC - Abnormal; Notable for the following components:      Result Value   WBC 22.0 (*)    RBC 2.88 (*)    Hemoglobin 11.2 (*)    HCT 33.1 (*)    MCV 114.9 (*)    MCH 38.9 (*)    All other components within normal limits  COMPREHENSIVE METABOLIC PANEL - Abnormal; Notable for the following components:   Sodium 129 (*)    Potassium 2.9 (*)    CO2 19 (*)    Glucose, Bld 113 (*)    Calcium 7.6 (*)    Total Protein 5.4 (*)    Albumin 2.2 (*)    AST 60 (*)    Alkaline Phosphatase 148 (*)    Total Bilirubin 4.1 (*)    All other components within normal limits  URINALYSIS, ROUTINE W REFLEX MICROSCOPIC - Abnormal; Notable for the following components:   Color, Urine AMBER (*)    Bilirubin Urine SMALL (*)    Ketones, ur 15  (*)    Leukocytes, UA TRACE (*)    All other components within normal limits  DIFFERENTIAL - Abnormal; Notable for the following components:   Neutro Abs 14.4 (*)    Monocytes Absolute 1.2 (*)    All other components within normal limits  PROTIME-INR - Abnormal; Notable for the following components:   Prothrombin Time 17.7 (*)    All other components within normal limits  URINALYSIS, MICROSCOPIC (REFLEX) - Abnormal; Notable for the following components:   Bacteria, UA RARE (*)    Squamous Epithelial / LPF 0-5 (*)    All other components within normal limits  I-STAT CG4 LACTIC ACID, ED - Abnormal; Notable for the following components:   Lactic Acid, Venous 2.09 (*)    All other components within normal limits  CULTURE, BLOOD (ROUTINE X 2)  CULTURE, BLOOD (ROUTINE X 2)  AMMONIA  BRAIN NATRIURETIC PEPTIDE  CK  ETHANOL  RAPID URINE DRUG SCREEN, HOSP PERFORMED  TROPONIN I  LITHIUM LEVEL  TSH  T4, FREE    EKG  EKG Interpretation  Date/Time:  Thursday December 22 2017 12:28:08 EST Ventricular Rate:  130 PR Interval:    QRS Duration: 130 QT Interval:  442 QTC Calculation: 585 R Axis:   49 Text Interpretation:  Atrial fibrillation. flutter or artifact Nonspecific intraventricular conduction delay Artifact in lead(s) I Since prior ECG, rhythm not clearly sinus, rate appears slightly increased Confirmed by Alvira MondaySchlossman, Erin ([id number]) on Oct 14, 2018 12:37:13 PM       Radiology Dg Chest 2 View  Result Date: Oct 14, 2018 CLINICAL DATA:  Dyspnea, cirrhosis EXAM: CHEST  2 VIEW COMPARISON:  12/06/2017 FINDINGS: Hypoventilation with mild bibasilar atelectasis. Negative for heart failure pneumonia or effusion. IMPRESSION: Mild bibasilar atelectasis Electronically Signed   By: Marlan Palauharles  Clark M.D.   On: 0Dec 14, 2019 13:27    Procedures Procedures (including critical care time)  Medications Ordered in ED Medications  potassium chloride SA (K-DUR,KLOR-CON) CR tablet 40 mEq (not administered)    thiamine (B-1) injection 500 mg (500 mg Intravenous Given 02-10-18 1551)  sodium chloride 0.9 % bolus 500 mL (500 mLs Intravenous New Bag/Given 02-10-18  1542)    Initial Impression / Assessment and Plan / ED Course  I have reviewed the triage vital signs and the nursing notes.  Pertinent labs & imaging results that were available during my care of the patient were reviewed by me and considered in my medical decision making (see chart for details).  Acute encephalopathy could be secondary to hyponatremia (129), lithium toxicity (levels pending), hypothyroidism (levels pending), adrenal insuffiencey off of steroids. Ammonia normal at 35.  Given recent history of alcohol abuse, cannot exclude Wernicke's, so IV thiamine administered.  Recent head CT 12/06/17 negative for acute abnormality but advanced for age cortical atrophy.  Technically meets SIRS criteria for tachypnea, tachycardia, and elevated WBC, but WBC improved from prior and vital signs initially difficult to obtain due to tremoring. Low suspicion for infection with lack of fever, normal chest x-ray, and near normal lactic acid despite intravascular volume depletion. Hypotension likely from intravascular volume depletion and cirrhosis--ketones present on UA, intermittently tachycardic, mucous membranes severely dry and RUQ ultrasound on 12/06/17 showed elevated portal pressures.   Regarding lower extremity edema, she has low albumin at 2.2. TSH and free T4 pending. BNP was normal and CXR without signs of fluid overload.   Patient feeling improved and becoming more conversant over course of ED work-up, having only received IV thiamine and 500 cc bolus of IV fluids. Ordered solucortef and kdur prior to transfer to [facility].    Final Clinical Impressions(s) / ED Diagnoses   Final diagnoses:  Encephalopathy  Lower extremity edema   Suspect multifactorial metabolic encephalopathy secondary to hypothyroidism, alcoholic hepatitis, hyponatremia,  possible Wernicke's, steroid withdrawal. Do not suspect infectious etiology. Lower extremity edema may be secondary to hypoalbuminemia in setting of liver disease. Given patient's complex medical course during last admission and acute worsening since yesterday, recommend admission for monitoring and further workup of change in mental status and volume status.   ED Discharge Orders    None       Dani Gobble Animas, MD 12/15/2017 Rexene Edison    Alvira Monday, MD 12/25/17 [phone]

## 2017-12-22 NOTE — ED Notes (Signed)
Unable to obtain 2nd set of blood cultures due to poor venous access, EDP aware

## 2017-12-22 NOTE — H&P (Signed)
History and Physical    Amy Powers ZOX:[id number] DOB: [birth year] DOA: 11-01-18  PCP: Pearline Cablesopland, [name], MD Consultants:  None Patient coming from:  Home - lives alone; NOK: mother  Chief Complaint: AMS  HPI: Amy Powers is a 47 y.o. female with medical history significant of schizophrenia; liver failure from alcoholism; and hypothyroidism presenting with AMS and LE edema.  "I fell down.  I don't know how long I was down.  And the EMT was called.  And that was it.  I was took to the hospital."  The last thing she remembers is waking up.  Before that, she remembers the [employer].  She is oriented to person, "[place], um, shoot", time.  She denies pain.    Denies medical problem other than addiction to alcohol.  Reports "nothing anymore".  February 11 was her last drink.  "I just fell and I guess I learned my lesson."  She was drinking about 1 bottle of wine daily.  She was hospitalized from 2/5-15 for hypotension with acute metabolic encephalopathy thought to be due to DTs, hepatic dysfunction, rhabdo, lithium toxicity, hypothyroidism, and UTI.  Reportedly, the patient was discharged to her mother's home and has not been receiving all of her medications - prednisolone (could not find), lactulose (ran out) synthroid (was confused and thought she was to start in 6 weeks), clonazapam etc. This is thought to be an education issue.   ED Course:   [facility] with LE edema, hyponatremia/hypokalemia, nystagmus-- labs pending TSH, lithium level-- per Er doc, stable for tele  Review of Systems: As per HPI; otherwise review of systems reviewed and negative.   This is suspect based on her AMS.  Ambulatory Status:  Ambulates without assistance  Past Medical History:  Diagnosis Date  . Alcohol abuse   . Depression   . Liver failure (HCC)   . Migraines     Past Surgical History:  Procedure Laterality Date  . CHOLECYSTECTOMY  1993  . GASTRIC BYPASS  1993    Social History    Socioeconomic History  . Marital status: Divorced    Spouse name: Not on file  . Number of children: Not on file  . Years of education: Not on file  . Highest education level: Not on file  Social Needs  . Financial resource strain: Not on file  . Food insecurity - worry: Not on file  . Food insecurity - inability: Not on file  . Transportation needs - medical: Not on file  . Transportation needs - non-medical: Not on file  Occupational History  . Not on file  Tobacco Use  . Smoking status: Former Smoker    Last attempt to quit: 1996    Years since quitting: 23.1  . Smokeless tobacco: Never Used  Substance and Sexual Activity  . Alcohol use: Yes    Alcohol/week: 3.6 oz    Types: 6 Glasses of wine per week    Comment: pt reports 1 full bottle per day  . Drug use: No  . Sexual activity: Not on file  Other Topics Concern  . Not on file  Social History Narrative  . Not on file    No Known Allergies  Family History  Problem Relation Age of Onset  . Arthritis Mother   . Diabetes Mother   . Hearing loss Mother   . Hypertension Mother   . Cancer Father   . COPD Father   . Hearing loss Father   .  Heart disease Father   . Arthritis Maternal Grandmother   . Depression Maternal Grandmother   . Hearing loss Maternal Grandmother   . Heart attack Maternal Grandmother   . Heart disease Maternal Grandmother   . Hypertension Maternal Grandmother   . Arthritis Maternal Grandfather   . Alcohol abuse Paternal Grandmother   . Cancer Paternal Grandmother   . Alcohol abuse Paternal Grandfather   . Cancer Paternal Grandfather     Prior to Admission medications   Medication Sig Start Date End Date Taking? Authorizing Provider  folic acid (FOLVITE) 1 MG tablet Take 1 tablet (1 mg total) by mouth daily. 12/17/17  Yes Rolly Salter, MD  furosemide (LASIX) 20 MG tablet Take 1 tablet (20 mg total) by mouth daily. 12/17/17  Yes Rolly Salter, MD  lamoTRIgine (LAMICTAL) 200 MG tablet  Take 200 mg by mouth at bedtime. 10/05/17  Yes [provider]  LATUDA 60 MG TABS Take 60 mg by mouth at bedtime. 10/05/17  Yes [provider]  lithium carbonate (LITHOBID) 300 MG CR tablet Take 300 mg by mouth 2 (two) times daily. 10/05/17  Yes [provider]  spironolactone (ALDACTONE) 50 MG tablet Take 1 tablet (50 mg total) by mouth daily. 12/17/17  Yes Rolly Salter, MD  SYNTHROID 100 MCG tablet Take 100 mcg by mouth daily. 09/13/17  Yes [provider]  thiamine 100 MG tablet Take 1 tablet (100 mg total) by mouth daily. 12/17/17  Yes Rolly Salter, MD  prednisoLONE (PRELONE) 15 MG/5ML SOLN Take 13.3 mLs (40 mg total) by mouth daily before breakfast. Take 40mg  daily till 01/03/2018,Take 30mg  daily for 5days,Take 20mg  daily for 5days,Take 10mg  daily for 5days, then stop. Patient not taking: Reported on 12-26-2017 12/16/17   Rolly Salter, MD    Physical Exam: Vitals:   12-26-2017 1602 12-26-17 1630 12/26/17 1700 26-Dec-2017 1924  BP: 94/70 90/74 98/68  101/64  Pulse: 89 87 90 86  Resp: 20 19 (!) 23 20  Temp:    98.2 F (36.8 C)  TempSrc:    Oral  SpO2: 98% 96% 96% 98%  Height:         General:  Appears calm and comfortable and is NAD; she does not make good eye contact and is not a good historian Eyes:   EOMI, normal lids, iris ENT:  grossly normal hearing, lips & tongue, mmm Neck:  no LAD, masses or thyromegaly Cardiovascular:  RRR, no m/r/g.  Respiratory:   CTA bilaterally with no wheezes/rales/rhonchi.  Normal respiratory effort. Abdomen:  Very distended abdomen concerning for ascites Skin: diffuse ecchymoses in various stages of healing along B arms Musculoskeletal: Marked 4+ pitting LE edema extending up to the groin Psychiatric: very flat mood and affect, speech fluent but slow and not entirely accurate, AOx2 Neurologic:  CN 2-12 grossly intact, moves all extremities in coordinated fashion, sensation intact    Radiological Exams on  Admission: Dg Chest 2 View  Result Date: 2017-12-26 CLINICAL DATA:  Dyspnea, cirrhosis EXAM: CHEST  2 VIEW COMPARISON:  12/06/2017 FINDINGS: Hypoventilation with mild bibasilar atelectasis. Negative for heart failure pneumonia or effusion. IMPRESSION: Mild bibasilar atelectasis Electronically Signed   By: Marlan Palau M.D.   On: 12-26-2017 13:27    EKG: Independently reviewed.  NSR with rate 99; low voltage, nonspecific ST changes with no evidence of acute ischemia   Labs on Admission: I have personally reviewed the available labs and imaging studies at the time of the admission.  Pertinent labs:   Na++ 129 K+ 2.9 CO2 19 Glucose 113 Calcium 7.6 AP 148 Albumin 2.2 AST 60/ALT 31/Bili 4.1 NH 4 35 INR 1.47 BNP, CK, troponin negative Lactate 2.09, 2.3 Upreg negative TSH 7.842/Free T4 1.53 UA: 15 ketones, trace LE, rare bacteria ETOH negative UDS negative Blood culture x 2 pending WBC 22.0; prior 24.5 Hgb 11.2 Lithium 1.27; 0.32 on 2/12  Assessment/Plan Principal Problem:   Hepatic failure due to alcoholism (HCC) Active Problems:   Altered mental status   Schizophrenia (HCC)   Alcoholic hepatitis with ascites   Hypothyroidism   Hepatic failure due to alcoholism, with AMS, ascites and marked LE edema -Patient with known hepatic failure related to alcoholism -Recent admission for encephalopathy -Presenting with acute/subacute onset of AMS again -With normal ammonia level now, hepatic encephalopathy seems less likely as the cause -There have been some medication difficulties and so this could be related -No apparent s/sx of infection  -Patient denies further ETOH intake and ETOH level is negative today -She does have hyponatremia; while fairly mild, it is acute and so this could be a contributor.  -She also has hypokalemia.  She was treated with 40 mEq PO KCl in the ER. -There was some consideration for Wernickes at [facility] and she was given IV thiamine; will continue for  now. -Her lithium level is minimally elevated - will hold medication but this seems less likely as the source of her AMS -Will treat with lactulose titrated to 2-3 soft stools/day -Will request IR consult to drain ascites and send fluid for analysis -She is supposed to be taking prednisolone 40 mg daily x 4 weeks and then have a 2 week taper per GI; this medication has apparently not been given.  Will resume. -MELD/MELD-Na score is 16/23, with a mortality rate of 6% -She was recently tested for Wilson's disease; my interpretation of the result is that this is a normal result -She has marked LE edema and so will try to diurese; while paracentesis will also help this, will start Lasix 20 mg IV BID and start PO aldactone (prescribed but not picked up) -No CIWA since the patient does not appear to have resumed drinking since her last hospitalization  Schizophrenia -Continue Lamictal; Latuda -Hold Lithium for now  Hypothyroidism -Started on medication during last hospitalization -Will not recheck levels at this time -Continue Synthroid at current dose   DVT prophylaxis: Lovenox  Code Status: Full - confirmed with patient Family Communication: None present Disposition Plan:  Home once clinically improved Consults called: IR; Consider GI, neurology  Admission status: Admit - It is my clinical opinion that admission to INPATIENT is reasonable and necessary because of the expectation that this patient will require hospital care that crosses at least 2 midnights to treat this condition based on the medical complexity of the problems presented.  Given the aforementioned information, the predictability of an adverse outcome is felt to be significant.     Jonah Blue MD [facility]  If note is complete, please contact covering daytime or nighttime physician. [email or web address] Password [facility]  12/23/2017, 12:40 AM

## 2017-12-22 NOTE — ED Provider Notes (Signed)
Please see resident note for full history, physical and care.  Briefly this is a 47 year old female with a recent complicated medical history including schizophrenia, etoh abuse, recent admission with concern for acute hepatic failure, as well as encephalopathy likely multifactorial secondary to hypothyroidism, lithium toxicity, and hepatic encephalopathy, who presents with concern for worsening encephalopathy at home.  Mom reports that at time of discharge from the hospital, patient was back at her baseline, mentating normally, eating well, checking bank statements and appeared well.    Over the last 2 days, she is developed severe lower extremity swelling, for which mom has given her extra dose of Lasix, as well as generalized weakness, fatigue, and some confusion at home.  Reports today she is not eating or drinking much.  Regarding her medications, she has not been receiving her thyroid medication at home as there is a miscommunication, and family thought that this should be started after it had been checked with her primary care physician.  In addition, mom was unable to find a pharmacy that had prednisolone available, and she had not started this.  She also had not been taking her thiamine.  The lactulose prescription was completed, and she has been off of it for the last 2 days, although she did have a normal bowel movement.  They have gone to follow-up with her primary care physician today and was sent to the emergency department for likely admission with concern for worsening encephalopathy.  On arrival to the emergency department, patient is ill-appearing, fatigued, confused with course tremors bilaterally, asterixis, dry mucous membranes, red tongue, 3+bilateral pitting edema, and both horizontal and vertical nystagmus.    --Differential for her worsening encephalopathy is broad, includes hepatic encephalopathy, hypothyroidism, lithium toxicity, wernicke's encephalopathy.  Mom denies any head  trauma, history of headaches, elicited for intracranial bleed.  She has been afebrile, without any infectious symptoms, no sign of UTI/pneumonia--ordered blood cx, however have low suspicion for sepsis, doubt encephalitis/meningitis. Leukocytosis has been chronic, may be steroid related.  Denies hx of recent etoh use, mom has been watching, do not suspect withdrawal.  Doubt this is acute adrenal insufficiency as pt has not been on long term steroids, however will given dose of hydrocortisone in ED.  Ammonia returned WNL.    Lithium and thyroid studies pending.  She did appear to have improvement with thiamine and consider Wernicke's and other vitamin def as contributors.  Symptomatic hyponatremia and hypokalemia with change in sodium to 129 also likely contributor to symptoms.  --Hyponatremia likely in setting of diuretic use, suspect overall intravascular depletion although she has significant edema.   --Regarding edema, likely secondary to elevated portal pressures, hypoalbuminemia. Consider CHF (beri beri), hypothyroidism.  Despite her peripheral edema, I feel she appears intravascuarly dry.  Gave 500cc NS.   --Blood pressures with systolics in 90s, however MAPs WNL, and stable while in ED.  Do not suspect bleeding, sepsis. Has received hydrocortisone. Suspect liver dysfunction as contributor versus possible other metabolic issue. Feel she is stable for stepdown given presentation. Pt mental status and appearance improving in ED.    Will admit for continued care of above problems to [facility]. Dr. Marlin CanaryJessica Vann accepting.    CRITICAL CARE: encephalopathy, reevaluation, electrolyte abnormalities Performed by: Lynnea FerrierErin E Kane Kusek   Total critical care time: 40 minutes  Critical care time was exclusive of separately billable procedures and treating other patients.  Critical care was necessary to treat or prevent imminent or life-threatening deterioration.  Critical care was time spent  personally by me  on the following activities: development of treatment plan with patient and/or surrogate as well as nursing, discussions with consultants, evaluation of patient's response to treatment, examination of patient, obtaining history from patient or surrogate, ordering and performing treatments and interventions, ordering and review of laboratory studies, ordering and review of radiographic studies, pulse oximetry and re-evaluation of patient's condition.      Alvira Monday, MD 12/13/2017 [phone]

## 2017-12-22 NOTE — ED Notes (Signed)
Pt mother [phone]

## 2017-12-22 NOTE — ED Notes (Signed)
Critical Lactic Acid report to MD.

## 2017-12-22 NOTE — Progress Notes (Addendum)
Patient arrived to unit a&ox3(self,place,situation). Pt has scattered bruise to BUE mostly hands and forearms. Small cabbed area lt ring finger. Left foot abrasion, MASD to sacrum and 2 small areas stage II. BLE +3 edema. glasses

## 2017-12-22 NOTE — ED Notes (Signed)
ED Provider at bedside. 

## 2017-12-22 NOTE — ED Notes (Signed)
**Note Amy-Identified via Obfuscation** ED TO INPATIENT HANDOFF REPORT  Name/Age/Gender Amy Powers 47 y.o. female  Code Status Code Status History    Date Active Date Inactive Code Status Order ID Comments User Context   12/07/2017 02:34 12/16/2017 19:46 Full Code [id number]  Quintella Baton, MD ED      Home/SNF/Other Home  Chief Complaint SOB, SHAKES, SWELLING, DIZZY  Level of Care/Admitting Diagnosis ED Disposition    ED Disposition Condition Comment   Admit  Hospital Area: [facility] [[id number]]  Level of Care: Telemetry [5]  Admit to tele based on following criteria: Monitor for Ischemic changes  Diagnosis: Liver failure [facility]) [[id number]]  Admitting Physician: Maida Sale  Attending Physician: Geradine Girt [[id number]]  Estimated length of stay: past midnight tomorrow  Certification:: I certify this patient will need inpatient services for at least 2 midnights  PT Class (Do Not Modify): Inpatient [101]  PT Acc Code (Do Not Modify): Private [1]       Medical History Past Medical History:  Diagnosis Date  . Alcohol abuse   . Chicken pox   . Depression   . Liver failure ([place])   . Migraines     Allergies No Known Allergies  IV Location/Drains/Wounds Patient Lines/Drains/Airways Status   Active Line/Drains/Airways    Name:   Placement date:   Placement time:   Site:   Days:   Peripheral IV 12/26/2017 Right Wrist   12/16/2017    1232    Wrist   less than 1   Peripheral IV 12/03/2017 Left Arm   12/26/2017    1414    Arm   less than 1          Labs/Imaging Results for orders placed or performed during the hospital encounter of 12/17/2017 (from the past 48 hour(s))  CBC     Status: Abnormal   Collection Time: 12/20/2017 12:25 PM  Result Value Ref Range   WBC 22.0 (H) 4.0 - 10.5 K/uL   RBC 2.88 (L) 3.87 - 5.11 MIL/uL   Hemoglobin 11.2 (L) 12.0 - 15.0 g/dL   HCT 33.1 (L) 36.0 - 46.0 %   MCV 114.9 (H) 78.0 - 100.0 fL   MCH 38.9 (H) 26.0 - 34.0 pg   MCHC 33.8 30.0 - 36.0 g/dL   RDW 13.0  11.5 - 15.5 %   Platelets 200 150 - 400 K/uL    Comment: Performed at [facility], [address] [place], [place] [address]  Ammonia     Status: None   Collection Time: 12/25/2017  1:45 PM  Result Value Ref Range   Ammonia 35 9 - 35 umol/L    Comment: Performed at [facility], [address] [place], [place] [address]  Comprehensive metabolic panel     Status: Abnormal   Collection Time: 12/21/2017  1:45 PM  Result Value Ref Range   Sodium 129 (L) 135 - 145 mmol/L   Potassium 2.9 (L) 3.5 - 5.1 mmol/L   Chloride 102 101 - 111 mmol/L   CO2 19 (L) 22 - 32 mmol/L   Glucose, Bld 113 (H) 65 - 99 mg/dL   BUN 7 6 - 20 mg/dL   Creatinine, Ser 0.81 0.44 - 1.00 mg/dL   Calcium 7.6 (L) 8.9 - 10.3 mg/dL   Total Protein 5.4 (L) 6.5 - 8.1 g/dL   Albumin 2.2 (L) 3.5 - 5.0 g/dL   AST 60 (H) 15 - 41 U/L   ALT 31 14 -  54 U/L   Alkaline Phosphatase 148 (H) 38 - 126 U/L   Total Bilirubin 4.1 (H) 0.3 - 1.2 mg/dL   GFR calc non Af Amer >60 >60 mL/min   GFR calc Af Amer >60 >60 mL/min    Comment: (NOTE) The eGFR has been calculated using the CKD EPI equation. This calculation has not been validated in all clinical situations. eGFR's persistently <60 mL/min signify possible Chronic Kidney Disease.    Anion gap 8 5 - 15    Comment: Performed at [facility], [address] [place], [place] [address]  Brain natriuretic peptide     Status: None   Collection Time: 12/28/2017  1:45 PM  Result Value Ref Range   B Natriuretic Peptide 75.2 0.0 - 100.0 pg/mL    Comment: Performed at [facility], [address] [place], [place] [address]  CK     Status: None   Collection Time: 12/25/2017  1:45 PM  Result Value Ref Range   Total CK 49 38 - 234 U/L    Comment: Performed at [facility], [address] [place], [place] [address]  Ethanol     Status: None   Collection Time: 12/18/2017  1:45 PM  Result Value Ref Range   Alcohol, Ethyl (B) <10 <10 mg/dL     Comment:        LOWEST DETECTABLE LIMIT FOR SERUM ALCOHOL IS 10 mg/dL FOR MEDICAL PURPOSES ONLY Performed at [facility], [address] [place], [place] [address]   Troponin I     Status: None   Collection Time: 12/12/2017  1:45 PM  Result Value Ref Range   Troponin I <0.03 <0.03 ng/mL    Comment: Performed at [facility], [address] [place], [place] [address]  Differential     Status: Abnormal   Collection Time: 12/24/2017  1:45 PM  Result Value Ref Range   Neutrophils Relative % 83 %   Neutro Abs 14.4 (H) 1.7 - 7.7 K/uL   Lymphocytes Relative 8 %   Lymphs Abs 1.4 0.7 - 4.0 K/uL   Monocytes Relative 7 %   Monocytes Absolute 1.2 (H) 0.1 - 1.0 K/uL   Eosinophils Relative 1 %   Eosinophils Absolute 0.1 0.0 - 0.7 K/uL   Basophils Relative 1 %   Basophils Absolute 0.1 0.0 - 0.1 K/uL    Comment: Performed at [facility], [address] [place], [place] [address]  Protime-INR     Status: Abnormal   Collection Time: 12/18/2017  1:45 PM  Result Value Ref Range   Prothrombin Time 17.7 (H) 11.4 - 15.2 seconds   INR 1.47     Comment: Performed at [facility], [address] [place], [place] [address]  Rapid urine drug screen (hospital performed)     Status: None   Collection Time: 12/09/2017  2:40 PM  Result Value Ref Range   Opiates NONE DETECTED NONE DETECTED   Cocaine NONE DETECTED NONE DETECTED   Benzodiazepines NONE DETECTED NONE DETECTED   Amphetamines NONE DETECTED NONE DETECTED   Tetrahydrocannabinol NONE DETECTED NONE DETECTED   Barbiturates NONE DETECTED NONE DETECTED    Comment: (NOTE) DRUG SCREEN FOR MEDICAL PURPOSES ONLY.  IF CONFIRMATION IS NEEDED FOR ANY PURPOSE, NOTIFY LAB WITHIN 5 DAYS. LOWEST DETECTABLE LIMITS FOR URINE DRUG SCREEN Drug Class  Cutoff (ng/mL) Amphetamine and metabolites    1000 Barbiturate and metabolites    200 Benzodiazepine                 832 Tricyclics and metabolites      300 Opiates and metabolites        300 Cocaine and metabolites        300 THC                            50 Performed at [facility], [address] [place], [place] [address]   Urinalysis, Routine w reflex microscopic     Status: Abnormal   Collection Time: 12/10/2017  2:40 PM  Result Value Ref Range   Color, Urine AMBER (A) YELLOW    Comment: BIOCHEMICALS MAY BE AFFECTED BY COLOR   APPearance CLEAR CLEAR   Specific Gravity, Urine 1.025 1.005 - 1.030   pH 6.0 5.0 - 8.0   Glucose, UA NEGATIVE NEGATIVE mg/dL   Hgb urine dipstick NEGATIVE NEGATIVE   Bilirubin Urine SMALL (A) NEGATIVE   Ketones, ur 15 (A) NEGATIVE mg/dL   Protein, ur NEGATIVE NEGATIVE mg/dL   Nitrite NEGATIVE NEGATIVE   Leukocytes, UA TRACE (A) NEGATIVE    Comment: Performed at [facility], [address] [place], [place] [address]  Urinalysis, Microscopic (reflex)     Status: Abnormal   Collection Time: 12/24/2017  2:40 PM  Result Value Ref Range   RBC / HPF 6-30 0 - 5 RBC/hpf   WBC, UA 0-5 0 - 5 WBC/hpf   Bacteria, UA RARE (A) NONE SEEN   Squamous Epithelial / LPF 0-5 (A) NONE SEEN   Mucus PRESENT     Comment: Performed at [facility], [address] [place], [place] [address]  Pregnancy, urine     Status: None   Collection Time: 12/17/2017  2:40 PM  Result Value Ref Range   Preg Test, Ur NEGATIVE NEGATIVE    Comment:        THE SENSITIVITY OF THIS METHODOLOGY IS >20 mIU/mL. Performed at [facility], [address] [place], [place] [address]   I-Stat CG4 Lactic Acid, ED     Status: Abnormal   Collection Time: 12/16/2017  3:25 PM  Result Value Ref Range   Lactic Acid, Venous 2.09 (HH) 0.5 - 1.9 mmol/L   Comment NOTIFIED PHYSICIAN    Dg Chest 2 View  Result Date: 12/11/2017 CLINICAL DATA:  Dyspnea, cirrhosis EXAM: CHEST  2 VIEW COMPARISON:  12/06/2017 FINDINGS: Hypoventilation with mild bibasilar atelectasis. Negative for heart failure pneumonia or effusion.  IMPRESSION: Mild bibasilar atelectasis Electronically Signed   By: Franchot Gallo M.D.   On: 12/13/2017 13:27    Pending Labs Unresulted Labs (From admission, onward)   Start     Ordered   12/27/2017 1707  Magnesium  STAT,   STAT     12/19/2017 1706   12/25/2017 1534  Culture, blood (routine x 2)  BLOOD CULTURE X 2,   STAT     12/27/2017 1533   12/20/2017 1301  TSH  STAT,   STAT     12/29/2017 1300   12/09/2017 1301  T4, free  STAT,   STAT     12/23/2017 1300   12/25/2017 1300  Lithium level  STAT,   STAT     12/02/2017 1259      Vitals/Pain Today's  Vitals   12/16/2017 1430 12/13/2017 1500 12/14/2017 1602 12/28/2017 1630  BP: 97/70 (!) 95/53 94/70 90/74   Pulse: 95 94 89 87  Resp: (!) 32 (!) 23 20 19   Temp:      TempSrc:      SpO2: 98% 94% 98% 96%  Height:        Isolation Precautions No active isolations  Medications Medications  thiamine (B-1) injection 500 mg (500 mg Intravenous Given 12/13/2017 1551)  sodium chloride 0.9 % bolus 500 mL (0 mLs Intravenous Stopped 12/19/2017 1640)  potassium chloride SA (K-DUR,KLOR-CON) CR tablet 40 mEq (40 mEq Oral Given 12/21/2017 1644)  hydrocortisone sodium succinate (SOLU-CORTEF) 100 MG injection 100 mg (100 mg Intravenous Given 12/04/2017 1645)    Mobility Ambulatory with assistance

## 2017-12-22 NOTE — ED Triage Notes (Signed)
[facility] with new diagnosis of liver failure. She is here with shaking, confusion, pale, edema and SOB x 5 days.

## 2017-12-23 ENCOUNTER — Inpatient Hospital Stay (HOSPITAL_COMMUNITY): Payer: BLUE CROSS/BLUE SHIELD

## 2017-12-23 ENCOUNTER — Encounter (HOSPITAL_COMMUNITY): Payer: Self-pay | Admitting: Internal Medicine

## 2017-12-23 DIAGNOSIS — K7040 Alcoholic hepatic failure without coma: Secondary | ICD-10-CM | POA: Insufficient documentation

## 2017-12-23 DIAGNOSIS — K7200 Acute and subacute hepatic failure without coma: Secondary | ICD-10-CM

## 2017-12-23 DIAGNOSIS — K7011 Alcoholic hepatitis with ascites: Secondary | ICD-10-CM

## 2017-12-23 DIAGNOSIS — E876 Hypokalemia: Secondary | ICD-10-CM

## 2017-12-23 DIAGNOSIS — R4182 Altered mental status, unspecified: Secondary | ICD-10-CM

## 2017-12-23 DIAGNOSIS — E039 Hypothyroidism, unspecified: Secondary | ICD-10-CM

## 2017-12-23 HISTORY — PX: IR PARACENTESIS: IMG2679

## 2017-12-23 LAB — PROCALCITONIN: PROCALCITONIN: 0.28 ng/mL

## 2017-12-23 LAB — COMPREHENSIVE METABOLIC PANEL
ALT: 31 U/L (ref 14–54)
AST: 60 U/L — ABNORMAL HIGH (ref 15–41)
Albumin: 2.1 g/dL — ABNORMAL LOW (ref 3.5–5.0)
Alkaline Phosphatase: 154 U/L — ABNORMAL HIGH (ref 38–126)
Anion gap: 10 (ref 5–15)
BUN: 8 mg/dL (ref 6–20)
CHLORIDE: 104 mmol/L (ref 101–111)
CO2: 19 mmol/L — ABNORMAL LOW (ref 22–32)
Calcium: 7.9 mg/dL — ABNORMAL LOW (ref 8.9–10.3)
Creatinine, Ser: 0.84 mg/dL (ref 0.44–1.00)
Glucose, Bld: 124 mg/dL — ABNORMAL HIGH (ref 65–99)
POTASSIUM: 3.4 mmol/L — AB (ref 3.5–5.1)
Sodium: 133 mmol/L — ABNORMAL LOW (ref 135–145)
Total Bilirubin: 4.5 mg/dL — ABNORMAL HIGH (ref 0.3–1.2)
Total Protein: 5.2 g/dL — ABNORMAL LOW (ref 6.5–8.1)

## 2017-12-23 LAB — PROTIME-INR
INR: 1.51
PROTHROMBIN TIME: 18.1 s — AB (ref 11.4–15.2)

## 2017-12-23 LAB — BODY FLUID CELL COUNT WITH DIFFERENTIAL
Lymphs, Fluid: 3 %
Monocyte-Macrophage-Serous Fluid: 66 % (ref 50–90)
Neutrophil Count, Fluid: 31 % — ABNORMAL HIGH (ref 0–25)
Total Nucleated Cell Count, Fluid: 84 cu mm (ref 0–1000)

## 2017-12-23 LAB — LACTIC ACID, PLASMA
LACTIC ACID, VENOUS: 2.8 mmol/L — AB (ref 0.5–1.9)
Lactic Acid, Venous: 2.3 mmol/L (ref 0.5–1.9)

## 2017-12-23 LAB — PROTEIN, PLEURAL OR PERITONEAL FLUID: Total protein, fluid: <3 g/dL

## 2017-12-23 LAB — ALBUMIN, PLEURAL OR PERITONEAL FLUID

## 2017-12-23 MED ORDER — ACETAMINOPHEN 650 MG RE SUPP: 650.0000 mg | Freq: Four times a day (QID) | RECTAL | Status: DC | PRN

## 2017-12-23 MED ORDER — LIDOCAINE HCL 1 % IJ SOLN
INTRAMUSCULAR | Status: AC
  Filled 2017-12-23: qty 20

## 2017-12-23 MED ORDER — ACETAMINOPHEN 325 MG PO TABS: 650.0000 mg | ORAL_TABLET | Freq: Four times a day (QID) | ORAL | Status: DC | PRN

## 2017-12-23 MED ORDER — FOLIC ACID 1 MG PO TABS
1.0000 mg | ORAL_TABLET | Freq: Every day | ORAL | Status: DC
  Administered 2017-12-23 – 2017-12-26 (×4): 1 mg via ORAL
  Filled 2017-12-23 (×4): qty 1

## 2017-12-23 MED ORDER — POTASSIUM CHLORIDE CRYS ER 20 MEQ PO TBCR
60.0000 meq | EXTENDED_RELEASE_TABLET | Freq: Once | ORAL | Status: AC
  Administered 2017-12-23: 60 meq via ORAL
  Filled 2017-12-23: qty 3

## 2017-12-23 MED ORDER — THIAMINE HCL 100 MG/ML IJ SOLN
500.0000 mg | INTRAVENOUS | Status: DC
  Administered 2017-12-23 – 2017-12-27 (×5): 500 mg via INTRAVENOUS
  Filled 2017-12-23 (×7): qty 5

## 2017-12-23 MED ORDER — FUROSEMIDE 10 MG/ML IJ SOLN
20.0000 mg | Freq: Two times a day (BID) | INTRAMUSCULAR | Status: DC
  Administered 2017-12-23 – 2017-12-26 (×4): 20 mg via INTRAVENOUS
  Filled 2017-12-23 (×7): qty 2

## 2017-12-23 MED ORDER — SODIUM CHLORIDE 0.9 % IV BOLUS (SEPSIS)
500.0000 mL | Freq: Once | INTRAVENOUS | Status: AC
  Administered 2017-12-23: 500 mL via INTRAVENOUS

## 2017-12-23 MED ORDER — LACTULOSE 10 GM/15ML PO SOLN: 30.0000 g | Freq: Every day | ORAL | Status: DC | PRN

## 2017-12-23 MED ORDER — LACTULOSE 10 GM/15ML PO SOLN
20.0000 g | Freq: Two times a day (BID) | ORAL | Status: DC
  Administered 2017-12-23 – 2017-12-25 (×4): 20 g via ORAL
  Filled 2017-12-23 (×4): qty 30

## 2017-12-23 MED ORDER — ENOXAPARIN SODIUM 40 MG/0.4ML ~~LOC~~ SOLN
40.0000 mg | Freq: Every day | SUBCUTANEOUS | Status: DC
  Administered 2017-12-23 – 2017-12-25 (×4): 40 mg via SUBCUTANEOUS
  Filled 2017-12-23 (×4): qty 0.4

## 2017-12-23 MED ORDER — LEVOTHYROXINE SODIUM 100 MCG PO TABS
100.0000 ug | ORAL_TABLET | Freq: Every day | ORAL | Status: DC
  Administered 2017-12-23 – 2017-12-26 (×4): 100 ug via ORAL
  Filled 2017-12-23 (×4): qty 1

## 2017-12-23 MED ORDER — LAMOTRIGINE 100 MG PO TABS
200.0000 mg | ORAL_TABLET | Freq: Every day | ORAL | Status: DC
  Administered 2017-12-23 – 2017-12-25 (×4): 200 mg via ORAL
  Filled 2017-12-23 (×4): qty 2

## 2017-12-23 MED ORDER — VITAMIN B-1 100 MG PO TABS: 100.0000 mg | ORAL_TABLET | Freq: Every day | ORAL | Status: DC

## 2017-12-23 MED ORDER — LIDOCAINE HCL 1 % IJ SOLN
INTRAMUSCULAR | Status: AC | PRN
  Administered 2017-12-23: 10 mL

## 2017-12-23 MED ORDER — ONDANSETRON HCL 4 MG/2ML IJ SOLN
4.0000 mg | Freq: Four times a day (QID) | INTRAMUSCULAR | Status: DC | PRN
  Administered 2018-01-03: 4 mg via INTRAVENOUS
  Filled 2017-12-23: qty 2

## 2017-12-23 MED ORDER — DOCUSATE SODIUM 100 MG PO CAPS
100.0000 mg | ORAL_CAPSULE | Freq: Two times a day (BID) | ORAL | Status: DC
  Administered 2017-12-23 (×2): 100 mg via ORAL
  Filled 2017-12-23 (×2): qty 1

## 2017-12-23 MED ORDER — ONDANSETRON HCL 4 MG PO TABS: 4.0000 mg | ORAL_TABLET | Freq: Four times a day (QID) | ORAL | Status: DC | PRN

## 2017-12-23 MED ORDER — LURASIDONE HCL 20 MG PO TABS
60.0000 mg | ORAL_TABLET | Freq: Every day | ORAL | Status: DC
  Administered 2017-12-23 – 2017-12-25 (×4): 60 mg via ORAL
  Filled 2017-12-23 (×4): qty 3

## 2017-12-23 MED ORDER — PREDNISOLONE 5 MG PO TABS
40.0000 mg | ORAL_TABLET | Freq: Every day | ORAL | Status: DC
  Administered 2017-12-23 – 2017-12-25 (×3): 40 mg via ORAL
  Filled 2017-12-23 (×5): qty 8

## 2017-12-23 MED ORDER — SPIRONOLACTONE 25 MG PO TABS
50.0000 mg | ORAL_TABLET | Freq: Every day | ORAL | Status: DC
  Administered 2017-12-23 – 2017-12-26 (×4): 50 mg via ORAL
  Filled 2017-12-23 (×4): qty 2

## 2017-12-23 MED ORDER — ALBUMIN HUMAN 25 % IV SOLN
25.0000 g | Freq: Four times a day (QID) | INTRAVENOUS | Status: AC
Start: 2017-12-23 — End: 2017-12-24
  Administered 2017-12-23 – 2017-12-24 (×4): 25 g via INTRAVENOUS
  Filled 2017-12-23 (×4): qty 100

## 2017-12-23 NOTE — Sedation Documentation (Signed)
Patient stated dizziness has subsided after drinking some coke and increased iv fluids.

## 2017-12-23 NOTE — Progress Notes (Signed)
New order for 500cc bolus ordered. Continue to monitor.

## 2017-12-23 NOTE — Sedation Documentation (Signed)
Patient complained of dizziness during paracentesis.  Amy RamaKevin Allred aware.  Fluid rate increased to 17900mL/hr during paracentesis.

## 2017-12-23 NOTE — Progress Notes (Signed)
CRITICAL VALUE ALERT  Critical Value:  Lactic acid 2.8  Date & Time Notied:  12/23/17, 0132  Provider Notified: Bodenheimer  Orders Received/Actions taken: see new order

## 2017-12-23 NOTE — Procedures (Signed)
Ultrasound-guided diagnostic and therapeutic paracentesis performed yielding 2.7 liters of yellow fluid. No immediate complications. A portion of the fluid was sent to the lab for preordered studies.

## 2017-12-23 NOTE — Progress Notes (Signed)
CRITICAL VALUE ALERT  Critical Value:  Lactic acid 2.3  Date & Time Notied:  12/23/2017 0527  Provider Notified: Bodenheimer  Orders Received/Actions taken: no new orders given

## 2017-12-23 NOTE — Progress Notes (Addendum)
Patient ID: Amy Powers, female   DOB: 15-Oct-1971, 47 y.o.   MRN: [id number]  PROGRESS NOTE    Amy Powers  WUJ:[id number] DOB: 11-Aug-1971 DOA: 12/10/2017 PCP: Amy Cables, MD   Brief Narrative:  47 year old female with history of schizophrenia, hypothyroidism, alcohol abuse, alcoholic liver disease with probable cirrhosis and encephalopathy with recent admission and discharge on 12/16/2017 for encephalopathy during which GI had evaluated the patient and patient was started on Lasix, spironolactone and lactulose along with prednisolone for alcoholic hepatitis.  She presented on 12/26/2017 for altered mental status.  It is unclear if the patient was taking on any of her medications after discharge.  Patient was admitted for altered mental status and started on lactulose.  IR was consulted for paracentesis.   Assessment & Plan:   Principal Problem:   Hepatic failure due to alcoholism (HCC) Active Problems:   Altered mental status   Schizophrenia (HCC)   Alcoholic hepatitis with ascites   Hypothyroidism  Acute metabolic encephalopathy probably secondary to hepatic encephalopathy -Patient had a recent admission for encephalopathy and had an extensive workup including MRI of the brain which was negative and EEG which did not show any seizures -Unclear if this is hepatic encephalopathy versus any other cause of encephalopathy. -Mental status has much improved without lactulose administration.  Continue thiamine IV for now as Warnicke's encephalopathy might also be a consideration. -Continue monitoring mental status -Fall precautions  Decompensated alcoholic liver disease with alcoholic hepatitis/cirrhosis along with ascites and probable hepatic encephalopathy -Patient apparently has not consumed any alcohol since discharge.  It is unclear if she took any of her medications at home upon discharge -Abdomen is very distended and patient has 2-3+ pitting edema.  She is currently hypotensive.   We will give a few doses of intravenous albumin and avoid further IV fluids. -Status post paracentesis and removal of 2.7 L fluid.  No signs of SBP on peritoneal fluid analysis.  Follow cultures -If blood pressure allows, diuretics will be continued including Lasix and spironolactone -We will change lactulose to scheduled doses for now as long as the patient has 2-3 bowel movements a day. -Continue prednisolone as recommended by GI during recent hospitalization which was 40 mg daily for 4 weeks then have a 2-week taper. -Patient had recent inpatient evaluation and follow-up by GI.  If condition does not improve, will reconsult GI otherwise patient will require outpatient GI follow-up.  Leukocytosis -Questionable cause.  Might be reactive secondary to steroids.  Repeat a.m. labs.  Currently not on antibiotics.  No signs of infection.  Repeat a.m. labs.  Follow blood cultures  Schizophrenia -Continue Lamictal and Latuda.  Lithium on hold.  Check a.m. lithium level  Hypothyroidism -Continue Synthroid  Hypokalemia -Replace.  Repeat a.m. labs  Hyponatremia -Improving.  Repeat a.m. labs.  DVT prophylaxis: Lovenox Code Status: Full Family Communication: None at bedside Disposition Plan: Depends on clinical outcome  Consultants: None  Procedures: paracentesis on 12/23/2017 Antimicrobials: None   Subjective: Patient seen and examined at bedside.  She is awake and answering some questions but is a poor historian.  No overnight fever, nausea or vomiting.  No abdominal pain currently.  Objective: Vitals:   12/23/17 1048 12/23/17 1050 12/23/17 1058 12/23/17 1319  BP: (!) 88/56 (!) 90/58 (!) 88/64 98/64  Pulse:    86  Resp:    16  Temp:    97.7 F (36.5 C)  TempSrc:    Oral  SpO2: 96% 97% 99% 96%  Height:  Intake/Output Summary (Last 24 hours) at 12/23/2017 1349 Last data filed at 12/23/2017 1610 Gross per 24 hour  Intake 500 ml  Output 100 ml  Net 400 ml   There were no  vitals filed for this visit.  Examination:  General exam: Appears in no distress.  Looks older than stated age Respiratory system: Bilateral decreased breath sound at bases Cardiovascular system: S1 & S2 heard, rate controlled  gastrointestinal system: Abdomen is distended, soft and nontender. Normal bowel sounds heard. Central nervous system: Alert and awake; slightly slow to answer questions. No focal neurological deficits. Moving extremities Extremities: No cyanosis, clubbing; 2-3+ edema  skin: No rashes, lesions or ulcers Lymph: No Cervical lymphadenopathy    Data Reviewed: I have personally reviewed following labs and imaging studies  CBC: Recent Labs  Lab Jan 19, 2018 1225 2018-01-19 1345  WBC 22.0*  --   NEUTROABS  --  14.4*  HGB 11.2*  --   HCT 33.1*  --   MCV 114.9*  --   PLT 200  --    Basic Metabolic Panel: Recent Labs  Lab 2018-01-19 1345 12/23/17 0352  NA 129* 133*  K 2.9* 3.4*  CL 102 104  CO2 19* 19*  GLUCOSE 113* 124*  BUN 7 8  CREATININE 0.81 0.84  CALCIUM 7.6* 7.9*  MG 2.0  --    GFR: Estimated Creatinine Clearance: 93.4 mL/min (by C-G formula based on SCr of 0.84 mg/dL). Liver Function Tests: Recent Labs  Lab 01-19-2018 1345 12/23/17 0352  AST 60* 60*  ALT 31 31  ALKPHOS 148* 154*  BILITOT 4.1* 4.5*  PROT 5.4* 5.2*  ALBUMIN 2.2* 2.1*   No results for input(s): LIPASE, AMYLASE in the last 168 hours. Recent Labs  Lab Jan 19, 2018 1345  AMMONIA 35   Coagulation Profile: Recent Labs  Lab 01/19/2018 1345 12/23/17 0352  INR 1.47 1.51   Cardiac Enzymes: Recent Labs  Lab 01-19-18 1345  CKTOTAL 49  TROPONINI <0.03   BNP (last 3 results) No results for input(s): PROBNP in the last 8760 hours. HbA1C: No results for input(s): HGBA1C in the last 72 hours. CBG: No results for input(s): GLUCAP in the last 168 hours. Lipid Profile: No results for input(s): CHOL, HDL, LDLCALC, TRIG, CHOLHDL, LDLDIRECT in the last 72 hours. Thyroid Function  Tests: Recent Labs    19-Jan-2018 1345  TSH 7.842*  FREET4 1.53*   Anemia Panel: No results for input(s): VITAMINB12, FOLATE, FERRITIN, TIBC, IRON, RETICCTPCT in the last 72 hours. Sepsis Labs: Recent Labs  Lab 19-Jan-2018 1525 2018-01-19 1745 12/23/17 0037 12/23/17 0352  PROCALCITON  --   --  0.28  --   LATICACIDVEN 2.09* 2.30* 2.8* 2.3*    No results found for this or any previous visit (from the past 240 hour(s)).       Radiology Studies: Dg Chest 2 View  Result Date: 01-19-2018 CLINICAL DATA:  Dyspnea, cirrhosis EXAM: CHEST  2 VIEW COMPARISON:  12/06/2017 FINDINGS: Hypoventilation with mild bibasilar atelectasis. Negative for heart failure pneumonia or effusion. IMPRESSION: Mild bibasilar atelectasis Electronically Signed   By: Marlan Palau M.D.   On: 2018/01/19 13:27   Ir Paracentesis  Result Date: 12/23/2017 INDICATION: Alcohol abuse, elevated liver function tests, ascites. Request made for diagnostic and therapeutic paracentesis. EXAM: ULTRASOUND GUIDED DIAGNOSTIC AND THERAPEUTIC PARACENTESIS MEDICATIONS: 1% LIDOCAINE LOCAL COMPLICATIONS: None immediate. PROCEDURE: Informed written consent was obtained from the patient/mother after a discussion of the risks, benefits and alternatives to treatment. A timeout was performed prior to the  initiation of the procedure. Initial ultrasound scanning demonstrates a moderate amount of ascites within the right lower abdominal quadrant. The right lower abdomen was prepped and draped in the usual sterile fashion. 1% lidocaine was used for local anesthesia. Following this, a Yueh catheter was introduced. An ultrasound image was saved for documentation purposes. The paracentesis was performed. The catheter was removed and a dressing was applied. The patient tolerated the procedure well without immediate post procedural complication. FINDINGS: A total of approximately 2.7 liters of yellow fluid was removed. Samples were sent to the laboratory as  requested by the clinical team. IMPRESSION: Successful ultrasound-guided diagnostic and therapeutic paracentesis yielding 2.7 liters of peritoneal fluid. Read by: Jeananne RamaKevin Allred, PA-C Electronically Signed   By: Judie PetitM.  Shick M.D.   On: 12/23/2017 12:46        Scheduled Meds: . docusate sodium  100 mg Oral BID  . enoxaparin (LOVENOX) injection  40 mg Subcutaneous QHS  . folic acid  1 mg Oral Daily  . furosemide  20 mg Intravenous BID  . lamoTRIgine  200 mg Oral QHS  . levothyroxine  100 mcg Oral QAC breakfast  . lurasidone  60 mg Oral QHS  . prednisoLONE  40 mg Oral Daily  . spironolactone  50 mg Oral Daily   Continuous Infusions: . albumin human    . thiamine injection       LOS: 1 day        Glade LloydKshitiz Meade Hogeland, MD [facility] Pager [phone]  If 7PM-7AM, please contact night-coverage [email or web address] Password [email or web address] 12/23/2017, 1:49 PM

## 2017-12-23 NOTE — Sedation Documentation (Signed)
2.7L fluid drained

## 2017-12-24 ENCOUNTER — Inpatient Hospital Stay (HOSPITAL_COMMUNITY): Payer: BLUE CROSS/BLUE SHIELD

## 2017-12-24 LAB — COMPREHENSIVE METABOLIC PANEL
ALBUMIN: 3.2 g/dL — AB (ref 3.5–5.0)
ALT: 26 U/L (ref 14–54)
AST: 49 U/L — AB (ref 15–41)
Alkaline Phosphatase: 115 U/L (ref 38–126)
Anion gap: 10 (ref 5–15)
BUN: 8 mg/dL (ref 6–20)
CHLORIDE: 106 mmol/L (ref 101–111)
CO2: 19 mmol/L — ABNORMAL LOW (ref 22–32)
CREATININE: 0.82 mg/dL (ref 0.44–1.00)
Calcium: 8.6 mg/dL — ABNORMAL LOW (ref 8.9–10.3)
GFR calc Af Amer: >60 mL/min (ref >60)
GLUCOSE: 99 mg/dL (ref 65–99)
Potassium: 3.7 mmol/L (ref 3.5–5.1)
Sodium: 135 mmol/L (ref 135–145)
Total Bilirubin: 3.4 mg/dL — ABNORMAL HIGH (ref 0.3–1.2)
Total Protein: 5.4 g/dL — ABNORMAL LOW (ref 6.5–8.1)

## 2017-12-24 LAB — MAGNESIUM: MAGNESIUM: 2.1 mg/dL (ref 1.7–2.4)

## 2017-12-24 LAB — CALCIUM, IONIZED: CALCIUM, IONIZED, SERUM: 4.5 mg/dL (ref 4.5–5.6)

## 2017-12-24 LAB — LITHIUM LEVEL: Lithium Lvl: 1.22 mmol/L — ABNORMAL HIGH (ref 0.60–1.20)

## 2017-12-24 MED ORDER — PIPERACILLIN-TAZOBACTAM 3.375 G IVPB
3.3750 g | Freq: Three times a day (TID) | INTRAVENOUS | Status: DC
  Administered 2017-12-24 – 2017-12-30 (×18): 3.375 g via INTRAVENOUS
  Filled 2017-12-24 (×17): qty 50

## 2017-12-24 MED ORDER — VANCOMYCIN HCL IN DEXTROSE 750-5 MG/150ML-% IV SOLN
750.0000 mg | Freq: Two times a day (BID) | INTRAVENOUS | Status: DC
  Administered 2017-12-25: 750 mg via INTRAVENOUS
  Filled 2017-12-24 (×2): qty 150

## 2017-12-24 MED ORDER — FUROSEMIDE 10 MG/ML IJ SOLN
20.0000 mg | Freq: Once | INTRAMUSCULAR | Status: AC
  Administered 2017-12-24: 20 mg via INTRAVENOUS
  Filled 2017-12-24: qty 2

## 2017-12-24 MED ORDER — VANCOMYCIN HCL 10 G IV SOLR
2000.0000 mg | Freq: Once | INTRAVENOUS | Status: AC
  Administered 2017-12-24: 2000 mg via INTRAVENOUS
  Filled 2017-12-24: qty 2000

## 2017-12-24 NOTE — Progress Notes (Signed)
PROGRESS NOTE    Amy Powers  JYN:[id number] DOB: [birth year] DOA: 16-Jan-2018 PCP: Pearline Cables, MD   Brief Narrative: Patient is a 47 year old female with past medical history of schizophrenia, hypothyroidism, alcohol abuse, alcoholic liver disease with probable cirrhosis.  She was recently admitted and discharged on 12/16/17 after management of hepatic encephalopathy.  Patient was referred by GI and was started on Lasix, lispro Aldactone and lactulose along with prednisolone for alcoholic hepatitis.  She presented to the emergency department again on January 16, 2018 for altered mental status.  It was unclear if patient was taking any of her medications after discharge. Patient was admitted for the management of altered mental status and started on lactulose.  She was also found to have ascites and underwent paracentesis on 12/23/17.  Assessment & Plan:   Principal Problem:   Hepatic failure due to alcoholism [facility]) Active Problems:   Altered mental status   Schizophrenia (HCC)   Alcoholic hepatitis with ascites   Hypothyroidism  Acute metabolic encephalopathy/hepatic encephalopathy: Hepatic encephalopathy was suspected on admission.  Patient underwent MRI of the brain which was negative for any acute intracranial abnormalities.  EEG was negative for seizures. Mental status has significantly improved. Her ammonia level is normal.  She is on lactulose.  There was also concern for Warnicke's encephalopathy.  Patient was started on IV thiamine. We will continue to monitor mental status.  Alcoholic hepatitis/cirrhosis/ascites: Patient's last drink was before her last admission. Abdomen was was found to be very distended on this presentation.  Patient also has 2-3+ pitting edema.   She is status post paracentesis with removal of 2.7 L of fluid on 12/21/17.  No signs of SBP on peritoneal fluid analysis.  Will follow cultures. Her blood pressure is on the lower side.  Patient is on Lasix and  spironolactone.  We will continue with this if her blood pressure allows. Continue lactulose. Continue prednisone as recommended by GI during recent hospitalization.  Sensitive 40 mg daily for total of 4 weeks then have 2 weeks taper. She will follow-up with gastroenterology as an outpatient.  Leukocytosis: Most likely secondary to steroids.  We will continue to monitor the trend.  Patient is not septic. Lactic acid is mildly elevated. We will follow-up cultures  Schizophrenia: On Lamictal and Latuda at home.  We will continue this.  Lithium level found to be supratherapeutic and thus is on hold.  We will continue to monitor the level.  Hypothyroidism: Continue Synthyroid  Hypokalemia: We will continue to replace as needed.  Hyponatremia: Improving.  We will follow-up BMP.   DVT prophylaxis: Lovenox Code Status: Full Family Communication: Mother present at the bedside Disposition Plan: Depends on clinical outcome   Consultants: None  Procedures: Paracentesis on 12/23/17  Antimicrobials: None  Subjective: Patient seen and examined the bedside this morning.  Currently she is alert, awake and oriented.  Mental status has improved.  No overnight fever, nausea or vomiting.  Patient has bilateral wheezes.  No history of COPD or asthma.  Will check chest x-ray.  She has severe peripheral edema.   Objective: Vitals:   12/24/17 0621 12/24/17 0930 12/24/17 0933 12/24/17 1304  BP: 103/76 (!) 91/51  100/77  Pulse: 90   80  Resp:    18  Temp:    98.1 F (36.7 C)  TempSrc:    Oral  SpO2:  90% 93% 93%  Weight:      Height:        Intake/Output Summary (Last 24 hours) at  12/24/2017 1316 Last data filed at 12/24/2017 0800 Gross per 24 hour  Intake 700 ml  Output 550 ml  Net 150 ml   Filed Weights   12/23/17 1500 12/24/17 0314  Weight: 90.7 kg (199 lb 15.3 oz) 91.3 kg (201 lb 4.5 oz)    Examination:  General exam: Appears calm and comfortable ,Not in distress, morbidly obese    HEENT:PERRL,Oral mucosa moist, Ear/Nose normal on gross exam Respiratory system: Bilateral wheezes, decreased air entry  cardiovascular system: S1 & S2 heard, RRR. No JVD, murmurs, rubs, gallops or clicks. No pedal edema. Gastrointestinal system: Abdomen is distended, ascites, soft and nontender. No organomegaly or masses felt. Normal bowel sounds heard. Central nervous system: Alert and oriented. No focal neurological deficits. Extremities: Severe peripheral  edema, no clubbing ,no cyanosis, distal peripheral pulses palpable. Skin: No rashes, lesions or ulcers,no icterus ,no pallor MSK: Normal muscle bulk,tone ,power Psychiatry: Judgement and insight appear normal. Mood & affect appropriate.     Data Reviewed: I have personally reviewed following labs and imaging studies  CBC: Recent Labs  Lab 12/25/2017 1225 12/03/2017 1345 12/24/17 0449  WBC 22.0*  --  14.5*  NEUTROABS  --  14.4* 11.2*  HGB 11.2*  --  8.5*  HCT 33.1*  --  25.8*  MCV 114.9*  --  115.2*  PLT 200  --  193   Basic Metabolic Panel: Recent Labs  Lab 12/15/2017 1345 12/23/17 0352 12/24/17 0449  NA 129* 133* 135  K 2.9* 3.4* 3.7  CL 102 104 106  CO2 19* 19* 19*  GLUCOSE 113* 124* 99  BUN 7 8 8   CREATININE 0.81 0.84 0.82  CALCIUM 7.6* 7.9* 8.6*  MG 2.0  --  2.1   GFR: Estimated Creatinine Clearance: 95.7 mL/min (by C-G formula based on SCr of 0.82 mg/dL). Liver Function Tests: Recent Labs  Lab 12/09/2017 1345 12/23/17 0352 12/24/17 0449  AST 60* 60* 49*  ALT 31 31 26   ALKPHOS 148* 154* 115  BILITOT 4.1* 4.5* 3.4*  PROT 5.4* 5.2* 5.4*  ALBUMIN 2.2* 2.1* 3.2*   No results for input(s): LIPASE, AMYLASE in the last 168 hours. Recent Labs  Lab 12/23/2017 1345  AMMONIA 35   Coagulation Profile: Recent Labs  Lab 12/05/2017 1345 12/23/17 0352  INR 1.47 1.51   Cardiac Enzymes: Recent Labs  Lab 12/03/2017 1345  CKTOTAL 49  TROPONINI <0.03   BNP (last 3 results) No results for input(s): PROBNP in the  last 8760 hours. HbA1C: No results for input(s): HGBA1C in the last 72 hours. CBG: No results for input(s): GLUCAP in the last 168 hours. Lipid Profile: No results for input(s): CHOL, HDL, LDLCALC, TRIG, CHOLHDL, LDLDIRECT in the last 72 hours. Thyroid Function Tests: Recent Labs    12/09/2017 1345  TSH 7.842*  FREET4 1.53*   Anemia Panel: No results for input(s): VITAMINB12, FOLATE, FERRITIN, TIBC, IRON, RETICCTPCT in the last 72 hours. Sepsis Labs: Recent Labs  Lab 12/16/2017 1525 12/05/2017 1745 12/23/17 0037 12/23/17 0352  PROCALCITON  --   --  0.28  --   LATICACIDVEN 2.09* 2.30* 2.8* 2.3*    Recent Results (from the past 240 hour(s))  Culture, blood (routine x 2)     Status: None (Preliminary result)   Collection Time: 12/28/2017  3:39 PM  Result Value Ref Range Status   Specimen Description   Final    BLOOD LEFT ANTECUBITAL Performed at [facility] [place], [address] [place], [place] [address]  Special Requests   Final    BOTTLES DRAWN AEROBIC AND ANAEROBIC Blood Culture adequate volume Performed at [facility], [address] [place], [place] [address]    Culture   Final    NO GROWTH < 24 HOURS Performed at [facility], [address], [place], [place] [address]    Report Status PENDING  Incomplete  Body fluid culture     Status: None (Preliminary result)   Collection Time: 12/23/17 10:36 AM  Result Value Ref Range Status   Specimen Description   Final    PERITONEAL Performed at [facility], [address], [place], [place] [address]    Special Requests   Final    NONE Performed at [facility], [address], [place], [place] [address]    Gram Stain   Final    WBC PRESENT, PREDOMINANTLY PMN NO ORGANISMS SEEN CYTOSPIN SMEAR    Culture   Final    NO GROWTH < 24 HOURS Performed at [facility], [address] [place], [place] [address]    Report Status PENDING  Incomplete          Radiology Studies: Dg Chest 2 View  Result Date: 12/30/17 CLINICAL DATA:  Dyspnea, cirrhosis EXAM: CHEST  2 VIEW COMPARISON:  12/06/2017 FINDINGS: Hypoventilation with mild bibasilar atelectasis. Negative for heart failure pneumonia or effusion. IMPRESSION: Mild bibasilar atelectasis Electronically Signed   By: Marlan Palau M.D.   On: 12/30/17 13:27   Ir Paracentesis  Result Date: 12/23/2017 INDICATION: Alcohol abuse, elevated liver function tests, ascites. Request made for diagnostic and therapeutic paracentesis. EXAM: ULTRASOUND GUIDED DIAGNOSTIC AND THERAPEUTIC PARACENTESIS MEDICATIONS: 1% LIDOCAINE LOCAL COMPLICATIONS: None immediate. PROCEDURE: Informed written consent was obtained from the patient/mother after a discussion of the risks, benefits and alternatives to treatment. A timeout was performed prior to the initiation of the procedure. Initial ultrasound scanning demonstrates a moderate amount of ascites within the right lower abdominal quadrant. The right lower abdomen was prepped and draped in the usual sterile fashion. 1% lidocaine was used for local anesthesia. Following this, a Yueh catheter was introduced. An ultrasound image was saved for documentation purposes. The paracentesis was performed. The catheter was removed and a dressing was applied. The patient tolerated the procedure well without immediate post procedural complication. FINDINGS: A total of approximately 2.7 liters of yellow fluid was removed. Samples were sent to the laboratory as requested by the clinical team. IMPRESSION: Successful ultrasound-guided diagnostic and therapeutic paracentesis yielding 2.7 liters of peritoneal fluid. Read by: Jeananne Rama, PA-C Electronically Signed   By: Judie Petit.  Shick M.D.   On: 12/23/2017 12:46        Scheduled Meds: . enoxaparin (LOVENOX) injection  40 mg Subcutaneous QHS  . folic acid  1 mg Oral Daily  . furosemide  20 mg Intravenous BID  . lactulose  20 g Oral BID   . lamoTRIgine  200 mg Oral QHS  . levothyroxine  100 mcg Oral QAC breakfast  . lurasidone  60 mg Oral QHS  . prednisoLONE  40 mg Oral Daily  . spironolactone  50 mg Oral Daily   Continuous Infusions: . thiamine injection Stopped (12/23/17 1730)     LOS: 2 days    Time spent: More than 50% of that time was spent in counseling and/or coordination of care.      Meredith Leeds, MD [facility] Pager [phone]  If 7PM-7AM, please contact night-coverage [email or web address] Password  [email or web address] 12/24/2017, 1:16 PM

## 2017-12-24 NOTE — Plan of Care (Signed)
  Progressing Education: Knowledge of General Education information will improve 12/24/2017 2234 - Progressing by Cristela FeltSteffens, Dartanyon Frankowski P, RN Coping: Level of anxiety will decrease 12/24/2017 2234 - Progressing by Cristela FeltSteffens, Ebubechukwu Jedlicka P, RN Pain Managment: General experience of comfort will improve 12/24/2017 2234 - Progressing by Cristela FeltSteffens, Mystie Ormand P, RN Safety: Ability to remain free from injury will improve 12/24/2017 2234 - Progressing by Cristela FeltSteffens, Mollee Neer P, RN

## 2017-12-24 NOTE — Progress Notes (Signed)
Patient more confused this RN shift, no urine output, and patient has some wheezing on exhalation. Severe edema in lower extremities extending to lower abdomen. MD on call notified. Bladder scan showed . In/Out cath done removed with 0ml post void bladder scan. BP improving 103/76 this AM. Will continue to monitor patient closely.

## 2017-12-24 NOTE — Progress Notes (Signed)
Pharmacy Antibiotic Note  Amy Powers is a 47 y.o. female admitted on May 12, 2018 with HCAP.  Pharmacy has been consulted for vancomycin and zosyn  dosing. CXR 2/23: New right perihilar and medial lung base opacity, developing since the prior chest radiograph, consistent with pneumonia. Plan: Zosyn 3.375g IV q8h (4 hour infusion).  Vancomycin 2 gm loading dose followed by vancomycin 750 mg IV q12h for AUC 477 (using SCr 0.82, wt 93.2 kg) Css max 29.1, Css min 13.7 F/u renal function, WBC, temp, culture data Vancomycin levels as needed  Height: 5\' 5"  (165.1 cm) Weight: 201 lb 4.5 oz (91.3 kg) IBW/kg (Calculated) : 57  Temp (24hrs), Avg:97.9 F (36.6 C), Min:97.8 F (36.6 C), Max:98.1 F (36.7 C)  Recent Labs  Lab Mar 14, 2018 1225 Mar 14, 2018 1345 Mar 14, 2018 1525 Mar 14, 2018 1745 12/23/17 0037 12/23/17 0352 12/24/17 0449  WBC 22.0*  --   --   --   --   --  14.5*  CREATININE  --  0.81  --   --   --  0.84 0.82  LATICACIDVEN  --   --  2.09* 2.30* 2.8* 2.3*  --     Estimated Creatinine Clearance: 95.7 mL/min (by C-G formula based on SCr of 0.82 mg/dL).    No Known Allergies  Antimicrobials this admission: 2/23 vanc>> 2/23 zosyn >> Dose adjustments this admission:  Microbiology results: 2/22 peritoneal fluid>>ng<24 hrs 2/21 Bcx<< ngtd   Thank you for allowing pharmacy to be a part of this patient's care.  Amy Powers, Pharm.D. [phone] 12/24/2017 3:43 PM

## 2017-12-24 NOTE — Progress Notes (Signed)
Pt's nurse requested that I get a [employer] or representative to visit pt who is not doing well. Upon speaking w/Msgr Nelson ChimesAnthony Marcaccio he asked that I give pt's family his information. I gave Msgr's information (number and name) to pt's mother.  Please page if further assistance is needed. Chaplain Elmarie Shileyamela Amillia Biffle [place], [place]   12/24/17 1300  Clinical Encounter Type  Visited With Family

## 2017-12-25 DIAGNOSIS — J189 Pneumonia, unspecified organism: Secondary | ICD-10-CM

## 2017-12-25 LAB — CBC WITH DIFFERENTIAL/PLATELET
BASOS ABS: 0 10*3/uL (ref 0.0–0.1)
BASOS PCT: 0 %
EOS ABS: 0.2 10*3/uL (ref 0.0–0.7)
Eosinophils Relative: 1 %
HCT: 30.2 % — ABNORMAL LOW (ref 36.0–46.0)
HEMOGLOBIN: 9.6 g/dL — AB (ref 12.0–15.0)
Lymphocytes Relative: 16 %
Lymphs Abs: 3.2 10*3/uL (ref 0.7–4.0)
MCH: 37.2 pg — AB (ref 26.0–34.0)
MCHC: 31.8 g/dL (ref 30.0–36.0)
MCV: 117.1 fL — ABNORMAL HIGH (ref 78.0–100.0)
MONO ABS: 1 10*3/uL (ref 0.1–1.0)
Monocytes Relative: 5 %
NEUTROS ABS: 15.4 10*3/uL — AB (ref 1.7–7.7)
Neutrophils Relative %: 78 %
PLATELETS: 211 10*3/uL (ref 150–400)
RBC: 2.58 MIL/uL — ABNORMAL LOW (ref 3.87–5.11)
RDW: 13.9 % (ref 11.5–15.5)
WBC: 19.8 10*3/uL — ABNORMAL HIGH (ref 4.0–10.5)

## 2017-12-25 LAB — LITHIUM LEVEL: LITHIUM LVL: 0.89 mmol/L (ref 0.60–1.20)

## 2017-12-25 LAB — BASIC METABOLIC PANEL
Anion gap: 10 (ref 5–15)
BUN: 6 mg/dL (ref 6–20)
CALCIUM: 8.8 mg/dL — AB (ref 8.9–10.3)
CO2: 19 mmol/L — ABNORMAL LOW (ref 22–32)
CREATININE: 0.83 mg/dL (ref 0.44–1.00)
Chloride: 108 mmol/L (ref 101–111)
Glucose, Bld: 94 mg/dL (ref 65–99)
Potassium: 3.6 mmol/L (ref 3.5–5.1)
Sodium: 137 mmol/L (ref 135–145)

## 2017-12-25 LAB — LACTIC ACID, PLASMA: LACTIC ACID, VENOUS: 1.9 mmol/L (ref 0.5–1.9)

## 2017-12-25 LAB — AMMONIA: Ammonia: 51 umol/L — ABNORMAL HIGH (ref 9–35)

## 2017-12-25 LAB — MRSA PCR SCREENING: MRSA by PCR: NEGATIVE

## 2017-12-25 MED ORDER — IPRATROPIUM-ALBUTEROL 0.5-2.5 (3) MG/3ML IN SOLN
3.0000 mL | Freq: Four times a day (QID) | RESPIRATORY_TRACT | Status: DC
  Administered 2017-12-25 (×3): 3 mL via RESPIRATORY_TRACT
  Filled 2017-12-25 (×3): qty 3

## 2017-12-25 MED ORDER — LORAZEPAM 2 MG/ML IJ SOLN
0.5000 mg | INTRAMUSCULAR | Status: DC | PRN
  Administered 2017-12-25 – 2017-12-26 (×3): 0.5 mg via INTRAVENOUS
  Filled 2017-12-25 (×4): qty 1

## 2017-12-25 MED ORDER — LACTULOSE 10 GM/15ML PO SOLN
20.0000 g | Freq: Three times a day (TID) | ORAL | Status: DC
  Administered 2017-12-25 – 2017-12-26 (×3): 20 g via ORAL
  Filled 2017-12-25 (×3): qty 30

## 2017-12-25 MED ORDER — LITHIUM CARBONATE ER 300 MG PO TBCR
300.0000 mg | EXTENDED_RELEASE_TABLET | Freq: Two times a day (BID) | ORAL | Status: DC
  Administered 2017-12-25 (×2): 300 mg via ORAL
  Filled 2017-12-25 (×3): qty 1

## 2017-12-25 MED ORDER — IPRATROPIUM-ALBUTEROL 0.5-2.5 (3) MG/3ML IN SOLN
3.0000 mL | Freq: Three times a day (TID) | RESPIRATORY_TRACT | Status: DC
  Administered 2017-12-26: 3 mL via RESPIRATORY_TRACT

## 2017-12-25 NOTE — Progress Notes (Signed)
PROGRESS NOTE    Amy Powers  ZOX:[id number] DOB: 02-13-1971 DOA: 12/29/2017 PCP: Pearline Cables, MD   Brief Narrative: Patient is a 47 year old female with past medical history of schizophrenia, hypothyroidism, alcohol abuse, alcoholic liver disease with probable cirrhosis.  She was recently admitted and discharged on 12/16/17 after management of hepatic encephalopathy.  Patient was referred by GI and was started on Lasix, lispro Aldactone and lactulose along with prednisolone for alcoholic hepatitis.  She presented to the emergency department again on 12/12/2017 for altered mental status.  It was unclear if patient was taking any of her medications after discharge. Patient was admitted for the management of altered mental status and started on lactulose.  She was also found to have ascites and underwent paracentesis on 12/23/17.  Assessment & Plan:   Principal Problem:   Hepatic failure due to alcoholism (HCC) Active Problems:   Altered mental status   Schizophrenia (HCC)   Alcoholic hepatitis with ascites   Hypothyroidism  Acute metabolic encephalopathy/hepatic encephalopathy: Confused today.Hepatic encephalopathy was suspected on admission.  Patient underwent MRI of the brain which was negative for any acute intracranial abnormalities.  EEG was negative for seizures. Her ammonia level is normal on presentation.  She is on lactulose.  There was also concern for Warnicke's encephalopathy.  Patient was started on IV thiamine. We will continue to monitor mental status.  HCAP: X-ray done yesterday was supposed to have pneumonia.  Will treat as healthcare ascitic pneumonia.  Started on vancomycin and Zosyn.  We will follow-up cultures.  Alcoholic hepatitis/cirrhosis/ascites: Patient's last drink was before her last admission. Abdomen was was found to be very distended on this presentation.  Patient also has 2-3+ pitting edema.   She is status post paracentesis with removal of 2.7 L of fluid  on 12/21/17.  No signs of SBP on peritoneal fluid analysis.  Will follow cultures. Her blood pressure is on the lower side.  Patient is on Lasix and spironolactone.  We will continue with this if her blood pressure allows. Continue lactulose. Continue prednisolone as recommended by GI during recent hospitalization.  She is on 40 mg daily for total of 4 weeks then will have 2 weeks taper. She will follow-up with gastroenterology as an outpatient.  Leukocytosis: Most likely secondary to steroids.  We will continue to monitor the trend.   Lactic acid inormalised. We will follow-up cultures  Schizophrenia: On Lamictal and Latuda at home.  We will continue this.  Lithium level found to be supratherapeutic on admission.  Restarted today  Hypothyroidism: Continue Synthyroid  Hypokalemia: We will continue to replace as needed.  Hyponatremia: Improving.  We will follow-up BMP.   DVT prophylaxis: Lovenox Code Status: Full Family Communication:None present at the bedside Disposition Plan: Depends on clinical outcome   Consultants: None  Procedures: Paracentesis on 12/23/17  Antimicrobials: None  Subjective: Patient seen and examined the bedside this morning.  Became confused this morning.  Respiratory status is improved.  Started on nebulizer treatment.  She was found to have pneumonia on chest x-ray she was started on antibiotics.  Objective: Vitals:   12/24/17 2053 12/25/17 0406 12/25/17 0838 12/25/17 0958  BP: 98/62 125/60  116/70  Pulse: 76 87  100  Resp: 20 20    Temp: 98.1 F (36.7 C) 98.5 F (36.9 C)    TempSrc: Oral Oral    SpO2: 92% 93% 91% 96%  Weight:  92.6 kg (204 lb 2.3 oz)    Height:  Intake/Output Summary (Last 24 hours) at 12/25/2017 1205 Last data filed at 12/25/2017 0826 Gross per 24 hour  Intake 1140 ml  Output 750 ml  Net 390 ml   Filed Weights   12/23/17 1500 12/24/17 0314 12/25/17 0406  Weight: 90.7 kg (199 lb 15.3 oz) 91.3 kg (201 lb 4.5 oz) 92.6  kg (204 lb 2.3 oz)    Examination:  General exam: Appears calm and comfortable , chronically ill, morbidly obese  HEENT:PERRL,Oral mucosa moist, Ear/Nose normal on gross exam Respiratory system: Bilateral decreased air entry , scattered wheezes  cardiovascular system: S1 & S2 heard, RRR. No JVD, murmurs, rubs, gallops or clicks. Gastrointestinal system: Abdomen is distended, soft and nontender. No organomegaly or masses felt. Normal bowel sounds heard.Ascites Central nervous system: Alert but  Not  oriented. No focal neurological deficits. Extremities: Severe  Edema on bilateral lower extremities, no clubbing ,no cyanosis, distal peripheral pulses palpable. Skin: No rashes, lesions or ulcers,no icterus ,no pallor MSK: Normal muscle bulk,tone ,power      Data Reviewed: I have personally reviewed following labs and imaging studies  CBC: Recent Labs  Lab 12-29-17 1225 Dec 29, 2017 1345 12/24/17 0449 12/25/17 0509  WBC 22.0*  --  14.5* 19.8*  NEUTROABS  --  14.4* 11.2* 15.4*  HGB 11.2*  --  8.5* 9.6*  HCT 33.1*  --  25.8* 30.2*  MCV 114.9*  --  115.2* 117.1*  PLT 200  --  193 211   Basic Metabolic Panel: Recent Labs  Lab 12-29-2017 1345 12/23/17 0352 12/24/17 0449 12/25/17 0509  NA 129* 133* 135 137  K 2.9* 3.4* 3.7 3.6  CL 102 104 106 108  CO2 19* 19* 19* 19*  GLUCOSE 113* 124* 99 94  BUN 7 8 8 6   CREATININE 0.81 0.84 0.82 0.83  CALCIUM 7.6* 7.9* 8.6* 8.8*  MG 2.0  --  2.1  --    GFR: Estimated Creatinine Clearance: 95.2 mL/min (by C-G formula based on SCr of 0.83 mg/dL). Liver Function Tests: Recent Labs  Lab 12-29-2017 1345 12/23/17 0352 12/24/17 0449  AST 60* 60* 49*  ALT 31 31 26   ALKPHOS 148* 154* 115  BILITOT 4.1* 4.5* 3.4*  PROT 5.4* 5.2* 5.4*  ALBUMIN 2.2* 2.1* 3.2*   No results for input(s): LIPASE, AMYLASE in the last 168 hours. Recent Labs  Lab 12-29-17 1345  AMMONIA 35   Coagulation Profile: Recent Labs  Lab December 29, 2017 1345 12/23/17 0352    INR 1.47 1.51   Cardiac Enzymes: Recent Labs  Lab 29-Dec-2017 1345  CKTOTAL 49  TROPONINI <0.03   BNP (last 3 results) No results for input(s): PROBNP in the last 8760 hours. HbA1C: No results for input(s): HGBA1C in the last 72 hours. CBG: No results for input(s): GLUCAP in the last 168 hours. Lipid Profile: No results for input(s): CHOL, HDL, LDLCALC, TRIG, CHOLHDL, LDLDIRECT in the last 72 hours. Thyroid Function Tests: Recent Labs    2017/12/29 1345  TSH 7.842*  FREET4 1.53*   Anemia Panel: No results for input(s): VITAMINB12, FOLATE, FERRITIN, TIBC, IRON, RETICCTPCT in the last 72 hours. Sepsis Labs: Recent Labs  Lab 12/29/17 1745 12/23/17 0037 12/23/17 0352 12/25/17 0509  PROCALCITON  --  0.28  --   --   LATICACIDVEN 2.30* 2.8* 2.3* 1.9    Recent Results (from the past 240 hour(s))  Culture, blood (routine x 2)     Status: None (Preliminary result)   Collection Time: December 29, 2017  3:39 PM  Result Value Ref Range  Status   Specimen Description   Final    BLOOD LEFT ANTECUBITAL Performed at [facility] [place], [address] [place], [place] [address]    Special Requests   Final    BOTTLES DRAWN AEROBIC AND ANAEROBIC Blood Culture adequate volume Performed at [facility] [place], [address] [place], [place] [address]    Culture   Final    NO GROWTH 2 DAYS Performed at [facility], [address] [place], [place] [address]    Report Status PENDING  Incomplete  Body fluid culture     Status: None (Preliminary result)   Collection Time: 12/23/17 10:36 AM  Result Value Ref Range Status   Specimen Description   Final    PERITONEAL Performed at [facility], [address] [place], [place] [address]    Special Requests   Final    NONE Performed at [facility], [address] [place], [place] [address]    Gram Stain   Final    WBC PRESENT, PREDOMINANTLY PMN NO ORGANISMS SEEN CYTOSPIN SMEAR     Culture   Final    NO GROWTH 2 DAYS Performed at [facility], [address] [place], [place] [address]    Report Status PENDING  Incomplete         Radiology Studies: Dg Chest 1 View  Result Date: 12/24/2017 CLINICAL DATA:  AMS, admitted x 2 days ago, hx ascites, ? PNA. EXAM: CHEST 1 VIEW COMPARISON:  08-16-18 FINDINGS: Hazy airspace opacity has developed in the right perihilar region, most apparent extending from the inferior right hilum. This is consistent with pneumonia in the proper clinical setting. Remainder of the lungs is clear. No pleural effusion or pneumothorax. Cardiac silhouette is normal in size. No mediastinal or hilar masses. Skeletal structures are intact. IMPRESSION: New right perihilar and medial lung base opacity, developing since the prior chest radiograph, consistent with pneumonia. Electronically Signed   By: Amie Portlandavid  Ormond M.D.   On: 12/24/2017 14:15        Scheduled Meds: . enoxaparin (LOVENOX) injection  40 mg Subcutaneous QHS  . folic acid  1 mg Oral Daily  . furosemide  20 mg Intravenous BID  . ipratropium-albuterol  3 mL Nebulization Q6H  . lactulose  20 g Oral BID  . lamoTRIgine  200 mg Oral QHS  . levothyroxine  100 mcg Oral QAC breakfast  . lithium carbonate  300 mg Oral BID  . lurasidone  60 mg Oral QHS  . prednisoLONE  40 mg Oral Daily  . spironolactone  50 mg Oral Daily   Continuous Infusions: . piperacillin-tazobactam (ZOSYN)  IV Stopped (12/25/17 1047)  . thiamine injection Stopped (12/24/17 1831)  . vancomycin Stopped (12/25/17 0629)     LOS: 3 days    Time spent: More than 50% of that time was spent in counseling and/or coordination of care.      Meredith LeedsAmrit Lazarus [place] BK, MD [facility] Pager [phone]  If 7PM-7AM, please contact night-coverage [email or web address] Password [facility] 12/25/2017, 12:05 PM

## 2017-12-25 NOTE — Progress Notes (Signed)
Patient found by nurse tech to be standing at the end of the bed, refused to get back in bed.  Cursing multiple staff members, grabbing IV pole and pulling over onto bed.  Press photographerCharge nurse and [facility] in room, security arrived.  Staff able to get patient back in bed, mittens applied as patient has pulled out foley, repeatedly removing nasal cannula and using IV pole as a weapon.  Will continue to monitor.  Ammonia level pending.

## 2017-12-25 NOTE — Plan of Care (Signed)
  Progressing Nutrition: Adequate nutrition will be maintained 12/25/2017 2335 - Progressing by Cristela FeltSteffens, Haim Hansson P, RN Elimination: Will not experience complications related to bowel motility 12/25/2017 2335 - Progressing by Cristela FeltSteffens, Carsen Leaf P, RN Will not experience complications related to urinary retention 12/25/2017 2335 - Progressing by Cristela FeltSteffens, Misha Vanoverbeke P, RN Pain Managment: General experience of comfort will improve 12/25/2017 2335 - Progressing by Cristela FeltSteffens, Margurite Duffy P, RN Safety: Ability to remain free from injury will improve 12/25/2017 2335 - Progressing by Cristela FeltSteffens, Delaynie Stetzer P, RN

## 2017-12-25 NOTE — Progress Notes (Signed)
[facility], upon standing blood was noted on chux pad, on further inspection it was noted patient had pulled her foley catheter out and had bled some due to this trauma.  Patient confused, appears oblivious to the fact that she pulled her catheter out.  Since foley was placed d/t retention this RN will continue to monitor patient to see if she can void prior to replacing foley.

## 2017-12-25 NOTE — Plan of Care (Signed)
VSS, patient with fluctuating mental status on 7 a to 7 p shift, had one period of agitation and fighting with staff however quickly settled back down, took her medications and ate some supper.  Ammonia level elevated, MD increased lactulose, patient with 2 small to moderate sized BM's this shift.  Mother at bedside earlier in shift.

## 2017-12-25 NOTE — Progress Notes (Signed)
[facility], was able to void approximately 150 ccs of amber urine and had a moderate sized bowel movement.  Will continue to monitor urine output.

## 2017-12-26 ENCOUNTER — Inpatient Hospital Stay (HOSPITAL_COMMUNITY): Payer: BLUE CROSS/BLUE SHIELD

## 2017-12-26 DIAGNOSIS — K7030 Alcoholic cirrhosis of liver without ascites: Secondary | ICD-10-CM

## 2017-12-26 DIAGNOSIS — K7040 Alcoholic hepatic failure without coma: Secondary | ICD-10-CM

## 2017-12-26 DIAGNOSIS — Z7189 Other specified counseling: Secondary | ICD-10-CM

## 2017-12-26 DIAGNOSIS — G9340 Encephalopathy, unspecified: Secondary | ICD-10-CM

## 2017-12-26 DIAGNOSIS — Z79899 Other long term (current) drug therapy: Secondary | ICD-10-CM

## 2017-12-26 LAB — CBC WITH DIFFERENTIAL/PLATELET
BASOS PCT: 0 %
Basophils Absolute: 0 10*3/uL (ref 0.0–0.1)
Basophils Absolute: 0 10*3/uL (ref 0.0–0.1)
Basophils Relative: 0 %
Eosinophils Absolute: 0 10*3/uL (ref 0.0–0.7)
Eosinophils Absolute: 0.4 10*3/uL (ref 0.0–0.7)
Eosinophils Relative: 0 %
Eosinophils Relative: 2 %
HEMATOCRIT: 25.8 % — AB (ref 36.0–46.0)
HEMATOCRIT: 27.9 % — AB (ref 36.0–46.0)
HEMOGLOBIN: 9.3 g/dL — AB (ref 12.0–15.0)
Hemoglobin: 8.5 g/dL — ABNORMAL LOW (ref 12.0–15.0)
LYMPHS PCT: 15 %
LYMPHS PCT: 16 %
Lymphs Abs: 2.2 10*3/uL (ref 0.7–4.0)
Lymphs Abs: 3.3 10*3/uL (ref 0.7–4.0)
MCH: 37.9 pg — ABNORMAL HIGH (ref 26.0–34.0)
MCH: 39.2 pg — AB (ref 26.0–34.0)
MCHC: 32.9 g/dL (ref 30.0–36.0)
MCHC: 33.3 g/dL (ref 30.0–36.0)
MCV: 115.2 fL — AB (ref 78.0–100.0)
MCV: 117.7 fL — ABNORMAL HIGH (ref 78.0–100.0)
MONOS PCT: 5 %
Monocytes Absolute: 1 10*3/uL (ref 0.1–1.0)
Monocytes Absolute: 1 10*3/uL (ref 0.1–1.0)
Monocytes Relative: 7 %
NEUTROS ABS: 11.3 10*3/uL — AB (ref 1.7–7.7)
NEUTROS ABS: 15.7 10*3/uL — AB (ref 1.7–7.7)
Neutrophils Relative %: 78 %
Neutrophils Relative %: 77 %
PLATELETS: 193 10*3/uL (ref 150–400)
Platelets: 174 10*3/uL (ref 150–400)
RBC: 2.24 MIL/uL — AB (ref 3.87–5.11)
RBC: 2.37 MIL/uL — ABNORMAL LOW (ref 3.87–5.11)
RDW: 13.6 % (ref 11.5–15.5)
RDW: 14.5 % (ref 11.5–15.5)
WBC: 14.5 10*3/uL — AB (ref 4.0–10.5)
WBC: 20.4 10*3/uL — ABNORMAL HIGH (ref 4.0–10.5)

## 2017-12-26 LAB — BASIC METABOLIC PANEL
Anion gap: 10 (ref 5–15)
BUN: 6 mg/dL (ref 6–20)
CO2: 18 mmol/L — AB (ref 22–32)
CREATININE: 0.88 mg/dL (ref 0.44–1.00)
Calcium: 8.6 mg/dL — ABNORMAL LOW (ref 8.9–10.3)
Chloride: 110 mmol/L (ref 101–111)
GFR calc non Af Amer: >60 mL/min (ref >60)
Glucose, Bld: 77 mg/dL (ref 65–99)
Potassium: 3.3 mmol/L — ABNORMAL LOW (ref 3.5–5.1)
Sodium: 138 mmol/L (ref 135–145)

## 2017-12-26 LAB — BODY FLUID CULTURE: Culture: NO GROWTH

## 2017-12-26 LAB — HEPATIC FUNCTION PANEL
ALBUMIN: 2.7 g/dL — AB (ref 3.5–5.0)
ALT: 25 U/L (ref 14–54)
AST: 48 U/L — ABNORMAL HIGH (ref 15–41)
Alkaline Phosphatase: 107 U/L (ref 38–126)
Bilirubin, Direct: 1.5 mg/dL — ABNORMAL HIGH (ref 0.1–0.5)
Indirect Bilirubin: 2.8 mg/dL — ABNORMAL HIGH (ref 0.3–0.9)
Total Bilirubin: 4.3 mg/dL — ABNORMAL HIGH (ref 0.3–1.2)
Total Protein: 4.7 g/dL — ABNORMAL LOW (ref 6.5–8.1)

## 2017-12-26 LAB — PATHOLOGIST SMEAR REVIEW

## 2017-12-26 LAB — PROCALCITONIN: Procalcitonin: 0.36 ng/mL

## 2017-12-26 LAB — LACTIC ACID, PLASMA: Lactic Acid, Venous: 2.3 mmol/L (ref 0.5–1.9)

## 2017-12-26 LAB — AMMONIA: Ammonia: 30 umol/L (ref 9–35)

## 2017-12-26 MED ORDER — SPIRONOLACTONE 25 MG PO TABS
50.0000 mg | ORAL_TABLET | Freq: Every day | ORAL | Status: DC
  Administered 2017-12-27 – 2017-12-28 (×2): 50 mg
  Filled 2017-12-26 (×2): qty 2

## 2017-12-26 MED ORDER — ALBUMIN HUMAN 25 % IV SOLN
12.5000 g | Freq: Four times a day (QID) | INTRAVENOUS | Status: AC
  Administered 2017-12-26 – 2017-12-27 (×3): 12.5 g via INTRAVENOUS
  Filled 2017-12-26 (×4): qty 50

## 2017-12-26 MED ORDER — LORAZEPAM 2 MG/ML IJ SOLN: 2.0000 mg | INTRAMUSCULAR | Status: DC | PRN

## 2017-12-26 MED ORDER — LEVOTHYROXINE SODIUM 100 MCG PO TABS
100.0000 ug | ORAL_TABLET | Freq: Every day | ORAL | Status: DC
  Administered 2017-12-28 – 2018-01-01 (×5): 100 ug
  Filled 2017-12-26 (×5): qty 1

## 2017-12-26 MED ORDER — LAMOTRIGINE 100 MG PO TABS: 200.0000 mg | ORAL_TABLET | Freq: Every day | ORAL | Status: DC

## 2017-12-26 MED ORDER — FOLIC ACID 1 MG PO TABS
1.0000 mg | ORAL_TABLET | Freq: Every day | ORAL | Status: DC
  Administered 2017-12-27 – 2018-01-01 (×6): 1 mg
  Filled 2017-12-26 (×6): qty 1

## 2017-12-26 MED ORDER — FUROSEMIDE 10 MG/ML IJ SOLN
20.0000 mg | Freq: Once | INTRAMUSCULAR | Status: AC
Start: 2017-12-26 — End: 2017-12-26
  Administered 2017-12-26: 20 mg via INTRAVENOUS
  Filled 2017-12-26: qty 2

## 2017-12-26 MED ORDER — POTASSIUM CHLORIDE CRYS ER 20 MEQ PO TBCR: 20.0000 meq | EXTENDED_RELEASE_TABLET | Freq: Every day | ORAL | Status: DC

## 2017-12-26 MED ORDER — LIDOCAINE HCL 2 % EX GEL
1.0000 "application " | Freq: Once | CUTANEOUS | Status: DC
  Filled 2017-12-26: qty 5

## 2017-12-26 MED ORDER — POTASSIUM CHLORIDE 10 MEQ/100ML IV SOLN
10.0000 meq | INTRAVENOUS | Status: AC
  Administered 2017-12-26 (×4): 10 meq via INTRAVENOUS
  Filled 2017-12-26 (×5): qty 100

## 2017-12-26 MED ORDER — SODIUM CHLORIDE 0.9% FLUSH: 10.0000 mL | INTRAVENOUS | Status: DC | PRN

## 2017-12-26 MED ORDER — RIFAXIMIN 550 MG PO TABS
550.0000 mg | ORAL_TABLET | Freq: Three times a day (TID) | ORAL | Status: DC
  Administered 2017-12-27 – 2017-12-28 (×6): 550 mg
  Filled 2017-12-26 (×10): qty 1

## 2017-12-26 MED ORDER — POTASSIUM CHLORIDE CRYS ER 20 MEQ PO TBCR: 40.0000 meq | EXTENDED_RELEASE_TABLET | Freq: Once | ORAL | Status: DC

## 2017-12-26 MED ORDER — LACTULOSE 10 GM/15ML PO SOLN: 30.0000 g | Freq: Three times a day (TID) | ORAL | Status: DC

## 2017-12-26 MED ORDER — LIDOCAINE HCL 2 % EX GEL
1.0000 "application " | Freq: Once | CUTANEOUS | Status: AC
  Administered 2017-12-26: 1 via TOPICAL
  Filled 2017-12-26: qty 11

## 2017-12-26 MED ORDER — RIFAXIMIN 550 MG PO TABS
550.0000 mg | ORAL_TABLET | Freq: Two times a day (BID) | ORAL | Status: DC
  Filled 2017-12-26: qty 1

## 2017-12-26 MED ORDER — DEXMEDETOMIDINE HCL IN NACL 200 MCG/50ML IV SOLN
0.4000 ug/kg/h | INTRAVENOUS | Status: DC
  Administered 2017-12-26: 1 ug/kg/h via INTRAVENOUS
  Administered 2017-12-26 (×3): 0.4 ug/kg/h via INTRAVENOUS
  Administered 2017-12-27 (×2): 0.5 ug/kg/h via INTRAVENOUS
  Administered 2017-12-27 (×2): 0.8 ug/kg/h via INTRAVENOUS
  Administered 2017-12-27: 0.6 ug/kg/h via INTRAVENOUS
  Administered 2017-12-28 (×2): 0.5 ug/kg/h via INTRAVENOUS
  Filled 2017-12-26 (×11): qty 50

## 2017-12-26 MED ORDER — PHENYLEPHRINE HCL-NACL 10-0.9 MG/250ML-% IV SOLN
0.0000 ug/min | INTRAVENOUS | Status: DC
  Administered 2017-12-26: 30 ug/min via INTRAVENOUS
  Administered 2017-12-26: 10 ug/min via INTRAVENOUS
  Administered 2017-12-26: 60 ug/min via INTRAVENOUS
  Administered 2017-12-27: 25 ug/min via INTRAVENOUS
  Administered 2017-12-27: 20 ug/min via INTRAVENOUS
  Administered 2017-12-27: 30 ug/min via INTRAVENOUS
  Administered 2017-12-28 (×2): 20 ug/min via INTRAVENOUS
  Administered 2017-12-28 (×2): 25 ug/min via INTRAVENOUS
  Administered 2017-12-29: 43.333 ug/min via INTRAVENOUS
  Administered 2017-12-29: 50 ug/min via INTRAVENOUS
  Administered 2017-12-29: 20 ug/min via INTRAVENOUS
  Administered 2017-12-29: 40 ug/min via INTRAVENOUS
  Filled 2017-12-26: qty 250
  Filled 2017-12-26: qty 500
  Filled 2017-12-26 (×7): qty 250
  Filled 2017-12-26: qty 500
  Filled 2017-12-26: qty 250

## 2017-12-26 MED ORDER — CHLORHEXIDINE GLUCONATE CLOTH 2 % EX PADS
6.0000 | MEDICATED_PAD | Freq: Every day | CUTANEOUS | Status: DC
  Administered 2017-12-27 – 2018-01-03 (×8): 6 via TOPICAL

## 2017-12-26 MED ORDER — FUROSEMIDE 10 MG/ML IJ SOLN
40.0000 mg | Freq: Two times a day (BID) | INTRAMUSCULAR | Status: DC
  Administered 2017-12-26 – 2018-01-03 (×15): 40 mg via INTRAVENOUS
  Filled 2017-12-26 (×16): qty 4

## 2017-12-26 MED ORDER — SODIUM CHLORIDE 0.9% FLUSH
10.0000 mL | Freq: Two times a day (BID) | INTRAVENOUS | Status: DC
  Administered 2017-12-26 – 2017-12-28 (×4): 10 mL
  Administered 2017-12-28: 30 mL
  Administered 2017-12-29 – 2018-01-01 (×6): 10 mL
  Administered 2018-01-02 – 2018-01-03 (×2): 20 mL

## 2017-12-26 MED ORDER — LACTULOSE 10 GM/15ML PO SOLN
30.0000 g | Freq: Three times a day (TID) | ORAL | Status: DC
  Administered 2017-12-27 – 2017-12-28 (×4): 30 g
  Filled 2017-12-26 (×4): qty 45

## 2017-12-26 MED ORDER — PREDNISOLONE 5 MG PO TABS
40.0000 mg | ORAL_TABLET | Freq: Every day | ORAL | Status: DC
  Administered 2017-12-27 – 2017-12-28 (×2): 40 mg
  Filled 2017-12-26 (×3): qty 8

## 2017-12-26 MED ORDER — LIDOCAINE HCL 2 % EX GEL: 1.0000 "application " | Freq: Once | CUTANEOUS | Status: DC

## 2017-12-26 NOTE — Assessment & Plan Note (Addendum)
D/w mom. Mom aware that long term prognosis from cirrhosis poor and renal dysfunction means evern poorer prognosis Plan Full medical scope of care.  Ventilator Short-term okay No CPR or defibrillation

## 2017-12-26 NOTE — Progress Notes (Signed)
PROGRESS NOTE    Amy Powers  QIO:[id number] DOB: 05-20-1971 DOA: 12/27/2017 PCP: Pearline Cables, MD   Brief Narrative: Patient is a 47 year old female with past medical history of schizophrenia, hypothyroidism, alcohol abuse, alcoholic liver disease with probable cirrhosis.  She was recently admitted and discharged on 12/16/17 after management of hepatic encephalopathy.  Patient was referred by GI and was started on Lasix, lispro Aldactone and lactulose along with prednisolone for alcoholic hepatitis.  She presented to the emergency department again on 12/16/2017 for altered mental status.  It was unclear if patient was taking any of her medications after discharge. Patient was admitted for the management of altered mental status and started on lactulose.  She was also found to have ascites and underwent paracentesis on 12/23/17. Patient's mental status again deteriorated this morning.  She was very agitated and confused. PCCM has been consulted.  She has been moved to ICU today.  Assessment & Plan:   Principal Problem:   Hepatic failure due to alcoholism (HCC) Active Problems:   Altered mental status   Schizophrenia (HCC)   Alcoholic hepatitis with ascites   Encephalopathy acute   Hypothyroidism   HCAP (healthcare-associated pneumonia)   Cirrhosis, alcoholic (HCC)   Goals of care, counseling/discussion  Acute metabolic encephalopathy/hepatic encephalopathy: Confused/agitated today.Hepatic encephalopathy was suspected on admission.  Patient underwent MRI of the brain which was negative for any acute intracranial abnormalities.  EEG was negative for seizures. Her ammonia level was normal on presentation but found to be elevated on 12/26/17.  She is on lactulose. Rifaximin is also added . There was also concern for Warnicke's encephalopathy.  Patient has been started on IV thiamine. We will continue to monitor mental status. She might need to be intubated for the protection of her  airway.Started on precedex.PCCM following.  HCAP: X-ray done was suggestive of  pneumonia.  Will treat as healthcare associated  pneumonia.  Started on vancomycin and Zosyn.  We will follow-up cultures.Currently just on Zosyn.  Alcoholic hepatitis/cirrhosis/ascites: Patient's last drink was before her last admission. Abdomen was was found to be very distended on this presentation.  Patient also has 2-3+ pitting edema.   She is status post paracentesis with removal of 2.7 L of fluid on 12/21/17.  No signs of SBP on peritoneal fluid analysis.  Will follow cultures.NGTD Her blood pressure is on the lower side.  Patient is on Lasix and spironolactone.  We will continue with this if her blood pressure allows. Continue lactulose. Continue prednisolone as recommended by GI during recent hospitalization.  She is on 40 mg daily for total of 4 weeks then will have 2 weeks taper. MELD score 16. We will consult GI if needed.She will follow-up with gastroenterology as an outpatient. Lasix increased to 40 mg twice a day today because of anasarca.  Albumin will also be administered.  Leukocytosis: Most likely secondary to steroids.  We will continue to monitor the trend.   Lactic acid mildly elevated. We will follow-up cultures  Schizophrenia: On Lamictal , Latuda,Lthium at home. Latuda and Lithium held due to altered mental status  Hypothyroidism: Continue Synthyroid  Hypokalemia: We will continue to replace as needed.  Hyponatremia: Improving.  We will follow-up BMP.   DVT prophylaxis: Lovenox Code Status: Full Family Communication:Mother present at the bedside Disposition Plan: Depends on clinical outcome   Consultants: None  Procedures: Paracentesis on 12/23/17  Antimicrobials: None  Subjective: Patient seen and examined the patient this morning.  She was found to be confused and  agitated.  Noted to have  worsening of  peripheral edema.  PCCM consulted.  Patient moved to  ICU  Objective: Vitals:   12/26/17 1348 12/26/17 1349 12/26/17 1436 12/26/17 1444  BP: (!) 85/53 102/62 (!) 74/38 (!) 69/30  Pulse: (!) 102 95 80 79  Resp: (!) 27 (!) 21 (!) 24 (!) 23  Temp:      TempSrc:      SpO2: 97% 95% 93% 92%  Weight:      Height:        Intake/Output Summary (Last 24 hours) at 12/26/2017 1449 Last data filed at 12/26/2017 1234 Gross per 24 hour  Intake 300 ml  Output 1700 ml  Net -1400 ml   Filed Weights   12/25/17 0406 12/26/17 0414 12/26/17 1234  Weight: 92.6 kg (204 lb 2.3 oz) 91.5 kg (201 lb 11.5 oz) 88.7 kg (195 lb 8.8 oz)    Examination:  General exam: Agitated, confused, morbidly obese HEENT:PERRL,Oral mucosa moist, Ear/Nose normal on gross exam Respiratory system: Bilateral increased air entry in the bases  cardiovascular system: S1 & S2 heard, RRR. No JVD, murmurs, rubs, gallops or clicks. Gastrointestinal system: Abdomen is distended, soft and nontender. No organomegaly or masses felt. Normal bowel sounds heard. Central nervous system:Not  Alert and oriented. No focal neurological deficits. Extremities: Severe peripheral edema, no clubbing ,no cyanosis, distal peripheral pulses palpable. Skin: No rashes, lesions or ulcers,no icterus ,no pallor   Data Reviewed: I have personally reviewed following labs and imaging studies  CBC: Recent Labs  Lab 2018-01-11 1225 January 11, 2018 1345 12/24/17 0449 12/25/17 0509 12/26/17 0711  WBC 22.0*  --  14.5* 19.8* 20.4*  NEUTROABS  --  14.4* 11.3* 15.4* 15.7*  HGB 11.2*  --  8.5* 9.6* 9.3*  HCT 33.1*  --  25.8* 30.2* 27.9*  MCV 114.9*  --  115.2* 117.1* 117.7*  PLT 200  --  193 211 174   Basic Metabolic Panel: Recent Labs  Lab 01-11-2018 1345 12/23/17 0352 12/24/17 0449 12/25/17 0509 12/26/17 0711  NA 129* 133* 135 137 138  K 2.9* 3.4* 3.7 3.6 3.3*  CL 102 104 106 108 110  CO2 19* 19* 19* 19* 18*  GLUCOSE 113* 124* 99 94 77  BUN 7 8 8 6 6   CREATININE 0.81 0.84 0.82 0.83 0.88  CALCIUM 7.6*  7.9* 8.6* 8.8* 8.6*  MG 2.0  --  2.1  --   --    GFR: Estimated Creatinine Clearance: 87.9 mL/min (by C-G formula based on SCr of 0.88 mg/dL). Liver Function Tests: Recent Labs  Lab 2018-01-11 1345 12/23/17 0352 12/24/17 0449 12/26/17 1238  AST 60* 60* 49* 48*  ALT 31 31 26 25   ALKPHOS 148* 154* 115 107  BILITOT 4.1* 4.5* 3.4* 4.3*  PROT 5.4* 5.2* 5.4* 4.7*  ALBUMIN 2.2* 2.1* 3.2* 2.7*   No results for input(s): LIPASE, AMYLASE in the last 168 hours. Recent Labs  Lab 01/11/18 1345 12/25/17 1232 12/26/17 0711  AMMONIA 35 51* 30   Coagulation Profile: Recent Labs  Lab 01/11/2018 1345 12/23/17 0352  INR 1.47 1.51   Cardiac Enzymes: Recent Labs  Lab 01-11-18 1345  CKTOTAL 49  TROPONINI <0.03   BNP (last 3 results) No results for input(s): PROBNP in the last 8760 hours. HbA1C: No results for input(s): HGBA1C in the last 72 hours. CBG: No results for input(s): GLUCAP in the last 168 hours. Lipid Profile: No results for input(s): CHOL, HDL, LDLCALC, TRIG, CHOLHDL, LDLDIRECT in the last 72  hours. Thyroid Function Tests: No results for input(s): TSH, T4TOTAL, FREET4, T3FREE, THYROIDAB in the last 72 hours. Anemia Panel: No results for input(s): VITAMINB12, FOLATE, FERRITIN, TIBC, IRON, RETICCTPCT in the last 72 hours. Sepsis Labs: Recent Labs  Lab 12/23/17 0037 12/23/17 0352 12/25/17 0509 12/26/17 1238  PROCALCITON 0.28  --   --  0.36  LATICACIDVEN 2.8* 2.3* 1.9 2.3*    Recent Results (from the past 240 hour(s))  Culture, blood (routine x 2)     Status: None (Preliminary result)   Collection Time: January 10, 2018  3:39 PM  Result Value Ref Range Status   Specimen Description   Final    BLOOD LEFT ANTECUBITAL Performed at [facility], [address] [place], [place] [address]    Special Requests   Final    BOTTLES DRAWN AEROBIC AND ANAEROBIC Blood Culture adequate volume Performed at [facility], [address] [place], [place]  [address]    Culture   Final    NO GROWTH 3 DAYS Performed at [facility], [address], [place], [place] [address]    Report Status PENDING  Incomplete  Body fluid culture     Status: None   Collection Time: 12/23/17 10:36 AM  Result Value Ref Range Status   Specimen Description   Final    PERITONEAL Performed at [facility], [address], [place], [place] [address]    Special Requests   Final    NONE Performed at [facility], [address] [place], [place] [address]    Gram Stain   Final    WBC PRESENT, PREDOMINANTLY PMN NO ORGANISMS SEEN CYTOSPIN SMEAR    Culture   Final    NO GROWTH 3 DAYS Performed at [facility], [address] [place], [place] [address]    Report Status 12/26/2017 FINAL  Final  MRSA PCR Screening     Status: None   Collection Time: 12/25/17  9:37 AM  Result Value Ref Range Status   MRSA by PCR NEGATIVE NEGATIVE Final    Comment:        The GeneXpert MRSA Assay (FDA approved for NASAL specimens only), is one component of a comprehensive MRSA colonization surveillance program. It is not intended to diagnose MRSA infection nor to guide or monitor treatment for MRSA infections. Performed at [facility], [address] [place], [place] [address]          Radiology Studies: Dg Chest Port 1 View  Result Date: 12/26/2017 CLINICAL DATA:  Alcoholic cirrhosis. Now with shortness of breath and signs of pneumonia. EXAM: PORTABLE CHEST 1 VIEW COMPARISON:  Chest x-ray of December 24, 2017 FINDINGS: Fluffy airspace opacities have developed bilaterally there is no pleural effusion. The heart is top-normal in size. The trachea is midline allowing for patient rotation. IMPRESSION: Fluffy airspace opacities most compatible with bilateral pulmonary interstitial and alveolar edema which may be of cardiac or noncardiac cause. Pneumonia cannot be excluded in the appropriate clinical  setting. Electronically Signed   By: David  [place] M.D.   On: 12/26/2017 13:40        Scheduled Meds: . [START ON 12/27/2017] folic acid  1 mg Per Tube Daily  . furosemide  40 mg Intravenous BID  . lamoTRIgine  200 mg Per Tube QHS  . [START ON 12/27/2017] levothyroxine  100 mcg Per Tube QAC breakfast  . [START ON 12/27/2017] prednisoLONE  40 mg Per  Tube Daily  . rifaximin  550 mg Per Tube TID  . [START ON 12/27/2017] spironolactone  50 mg Per Tube Daily   Continuous Infusions: . albumin human    . dexmedetomidine (PRECEDEX) IV infusion Stopped (12/26/17 1445)  . piperacillin-tazobactam (ZOSYN)  IV 3.375 g (12/26/17 1357)  . potassium chloride 10 mEq (12/26/17 1359)  . thiamine injection Stopped (12/25/17 1640)     LOS: 4 days    Time spent: More than 50% of that time was spent in counseling and/or coordination of care.      Meredith LeedsAmrit Annitta Fifield BK, MD [facility] Pager [phone]  If 7PM-7AM, please contact night-coverage [email or web address] Password [email or web address] 12/26/2017, 2:49 PM

## 2017-12-26 NOTE — Progress Notes (Signed)
Report called to Maralyn SagoSarah in ICU. Maeola Harmanark, Tavia Stave Johnson

## 2017-12-26 NOTE — Progress Notes (Signed)
   12/26/17 1200  Clinical Encounter Type  Visited With Patient and family together  Visit Type Initial;Psychological support;Spiritual support  Referral From Nurse  Consult/Referral To Chaplain  Spiritual Encounters  Spiritual Needs Ritual;Emotional;Other (Comment) (Spiritual Care Conversation/Support)  Stress Factors  Patient Stress Factors Not reviewed  Family Stress Factors Health changes;Major life changes;Other (Comment)   I visited with the patient and her mother per referral from the nurse who stated that the patient's mother had requested a [employer] Priest.  They were transferring the patient to the ICU when I arrived. The patient's mother was tearful and stated that she understood the patient's condition; but how difficult it was.  [place]. Patient's mother [place] from [place] be with the patient, who [place] years [place].  The patient's mother stated that their Catholic faith is important to them. She requested a [employer] Priest for an Insurance risk surveyoranointing.  I have contacted [facility] and am waiting on a return phone call to arrange a [facility].   Please, contact Spiritual Care for further assistance.   Chaplain Clint BolderBrittany Makaiyah Schweiger M.Div., [facility]

## 2017-12-26 NOTE — Consult Note (Signed)
PULMONARY / CRITICAL CARE MEDICINE   Name: Amy Powers MRN: [id number] DOB: 05/09/1971 PCP Copland, Gwenlyn Found, MD LOS 4 as of 12/26/2017     ADMISSION DATE:  January 17, 2018 CONSULTATION DATE:  12/26/2017   REFERRING MD:  [facility] hospitalist  CHIEF COMPLAINT:  Acute encephaloathy  HISTORY OF PRESENT ILLNESS:   History is provided by the mother and review of the chart.  Patient is unable to give history because of acute encephalopathy  47 year old female lifelong alcoholic and prior hx of bipolar.  Lost her job in December 2018 and then became depressed.  After that mother suspects patient has been drinking.  Patient lives alone and does not have any family members locally.  Closest family member his mother in [place] and a sibling in [place] and another sibling in [place].  There are no children or friends [place].  She has been divorced for the last 4 years.  Then approximately December 06, 2017 patient was admitted for ascites, anasarca and hepatic encephalopathy and mild rhabdo (CK 1700) according to the mother.  Discharge around December 16, 2017 and was doing well with improved mobility but using a walker.  But according to the mother patient ran out of lactulose because there was no refill on this from the hospital. ALso given prednisonolne at discharge but mom says pharmacy was out of this so not given.  Patient is also having worsening ascites and anasarca.  Finally seen by primary care physician 17-Jan-2018 and sent to the emergency room and patient admitted.  Since admission overall mom does not think anasarca has improved.  In addition patient has developed acute encephalopathy with agitated delirium.  This is now been going on for the last 2 days.  It is unchanged to the mother.  Also intermittent tachypnea.  Therefore critical care medicine has been consulted. MELD score 12/26/2017 is 16   Home pysch meds: lithium, latuda, lamictal  Mom is very astute about the consequences of  cirrhosis and has discussed care limitations with [place].  PAST MEDICAL HISTORY :  She  has a past medical history of Alcohol abuse, Depression, Hypothyroidism, Liver failure (HCC), Migraines, and Schizophrenia (HCC).  PAST SURGICAL HISTORY: She  has a past surgical history that includes Cholecystectomy (1993); Gastric bypass (1993); and IR Paracentesis (12/23/2017).  No Known Allergies  No current facility-administered medications on file prior to encounter.    Current Outpatient Medications on File Prior to Encounter  Medication Sig  . folic acid (FOLVITE) 1 MG tablet Take 1 tablet (1 mg total) by mouth daily.  . furosemide (LASIX) 20 MG tablet Take 1 tablet (20 mg total) by mouth daily.  Marland Kitchen lamoTRIgine (LAMICTAL) 200 MG tablet Take 200 mg by mouth at bedtime.  Marland Kitchen LATUDA 60 MG TABS Take 60 mg by mouth at bedtime.  Marland Kitchen lithium carbonate (LITHOBID) 300 MG CR tablet Take 300 mg by mouth 2 (two) times daily.  Marland Kitchen spironolactone (ALDACTONE) 50 MG tablet Take 1 tablet (50 mg total) by mouth daily.  Marland Kitchen SYNTHROID 100 MCG tablet Take 100 mcg by mouth daily.  Marland Kitchen thiamine 100 MG tablet Take 1 tablet (100 mg total) by mouth daily.    FAMILY HISTORY:  Her indicated that the status of her mother is unknown. She indicated that the status of her father is unknown. She indicated that the status of her maternal grandmother is unknown. She indicated that the status of her maternal grandfather is unknown. She indicated that the status of her paternal grandmother is  unknown. She indicated that the status of her paternal grandfather is unknown.   SOCIAL HISTORY: She  reports that she quit smoking about 23 years ago. she has never used smokeless tobacco. She reports that she drinks about 3.6 oz of alcohol per week. She reports that she does not use drugs.  REVIEW OF SYSTEMS:   As per history of present illness otherwise unelicitable.   VITAL SIGNS: BP (!) 115/44 (BP Location: Left Arm)   Pulse (!) 112   Temp  98.2 F (36.8 C) (Oral)   Resp (!) 22   Ht 5\' 5"  (1.651 m)   Wt 91.5 kg (201 lb 11.5 oz)   LMP  (LMP Unknown)   SpO2 95%   BMI 33.57 kg/m   HEMODYNAMICS:    VENTILATOR SETTINGS:    INTAKE / OUTPUT: I/O last 3 completed shifts: In: 760 [P.O.:460; IV Piggyback:300] Out: 1100 [Urine:1100]     EXAM  General Appearance:    Looks criticall ill OBESE -yes  Head:    Normocephalic, without obvious abnormality, atraumatic  Eyes:    PERRL -yes, conjunctiva/corneas -clear      Ears:    Normal external ear canals, both ears  Nose:   NG tube -no but oxygen  Throat:  ETT TUBE -no, OG tube -no  Neck:   Supple,  No enlargement/tenderness/nodules     Lungs:     Clear to auscultation bilaterally, tachypneic  Chest wall:    No deformity  Heart:    S1 and S2 normal, no murmur, CVP -no.  Pressors -no  Abdomen:     Soft, no masses, no organomegaly  Genitalia:    Not done  Rectal:   not done  Extremities:   Extremities-ascites with severe 3+ edema     Skin:   Intact in exposed areas .      Neurologic:   Sedation - none -> RASS --2 to +2 fluctuates . Moves all 4s - yes. CAM-ICU - positive for delirum . Orientation - not oriented but started crying when goals of care discussed with her mom       LABS  PULMONARY No results for input(s): PHART, PCO2ART, PO2ART, HCO3, TCO2, O2SAT in the last 168 hours.  Invalid input(s): PCO2, PO2  CBC Recent Labs  Lab 12/24/17 0449 12/25/17 0509 12/26/17 0711  HGB 8.5* 9.6* 9.3*  HCT 25.8* 30.2* 27.9*  WBC 14.5* 19.8* 20.4*  PLT 193 211 174    COAGULATION Recent Labs  Lab 01-19-2018 1345 12/23/17 0352  INR 1.47 1.51    CARDIAC   Recent Labs  Lab 01-19-2018 1345  TROPONINI <0.03   No results for input(s): PROBNP in the last 168 hours.   CHEMISTRY Recent Labs  Lab 2018-01-19 1345 12/23/17 0352 12/24/17 0449 12/25/17 0509 12/26/17 0711  NA 129* 133* 135 137 138  K 2.9* 3.4* 3.7 3.6 3.3*  CL 102 104 106 108 110  CO2 19* 19*  19* 19* 18*  GLUCOSE 113* 124* 99 94 77  BUN 7 8 8 6 6   CREATININE 0.81 0.84 0.82 0.83 0.88  CALCIUM 7.6* 7.9* 8.6* 8.8* 8.6*  MG 2.0  --  2.1  --   --    Estimated Creatinine Clearance: 89.3 mL/min (by C-G formula based on SCr of 0.88 mg/dL).   LIVER Recent Labs  Lab 01/19/2018 1345 12/23/17 0352 12/24/17 0449  AST 60* 60* 49*  ALT 31 31 26   ALKPHOS 148* 154* 115  BILITOT 4.1* 4.5* 3.4*  PROT  5.4* 5.2* 5.4*  ALBUMIN 2.2* 2.1* 3.2*  INR 1.47 1.51  --      INFECTIOUS Recent Labs  Lab 12/23/17 0037 12/23/17 0352 12/25/17 0509  LATICACIDVEN 2.8* 2.3* 1.9  PROCALCITON 0.28  --   --      ENDOCRINE CBG (last 3)  No results for input(s): GLUCAP in the last 72 hours.       IMAGING x48h  - image(s) personally visualized  -   highlighted in bold Dg Chest 1 View  Result Date: 12/24/2017 CLINICAL DATA:  AMS, admitted x 2 days ago, hx ascites, ? PNA. EXAM: CHEST 1 VIEW COMPARISON:  06/15/18 FINDINGS: Hazy airspace opacity has developed in the right perihilar region, most apparent extending from the inferior right hilum. This is consistent with pneumonia in the proper clinical setting. Remainder of the lungs is clear. No pleural effusion or pneumothorax. Cardiac silhouette is normal in size. No mediastinal or hilar masses. Skeletal structures are intact. IMPRESSION: New right perihilar and medial lung base opacity, developing since the prior chest radiograph, consistent with pneumonia. Electronically Signed   By: Amie Portlandavid  Ormond M.D.   On: 12/24/2017 14:15       ASSESSMENT and PLAN  Encephalopathy acute Delirum - likely hepatic due to  Lack of lactulose at home.  but role of home psych meds need to be soprted out. Agitated delirum +. Mom denies patient was drkinking  Plan Move to ICU CCM consult for now Start precedex gtt Place panda and start oral xifaxan and lactulose Psych consult for role of meds Monitor  Cirrhosis, alcoholic (HCC) Last drink late Jan 2019,.  Dx of early-mid feb admission - alcoholic hepatis and given prednisolone which sshe could not fill at discharge. 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 cirrhosis but with normal platelet and renal function MELD score is low 16 suggestging likely good prognosis from this admit  Plan Prednisolone Recommend GI [facility]   HCAP (healthcare-associated pneumonia) New infitlrate RLL 12/24/17  Plan Repeat cxr Recheck procalcitonin and lactate abx Anti-infectives (From admission, onward)   Start     Dose/Rate Route Frequency Ordered Stop   12/26/17 1130  rifaximin (XIFAXAN) tablet 550 mg     550 mg Oral 2 times daily 12/26/17 1126     12/25/17 0600  vancomycin (VANCOCIN) IVPB 750 mg/150 ml premix  Status:  Discontinued     750 mg 150 mL/hr over 60 Minutes Intravenous Every 12 hours 12/24/17 1550 12/25/17 1829   12/24/17 1630  vancomycin (VANCOCIN) 2,000 mg in sodium chloride 0.9 % 500 mL IVPB     2,000 mg 250 mL/hr over 120 Minutes Intravenous  Once 12/24/17 1532 12/24/17 1947   12/24/17 1600  piperacillin-tazobactam (ZOSYN) IVPB 3.375 g     3.375 g 12.5 mL/hr over 240 Minutes Intravenous Every 8 hours 12/24/17 1530         Goals of care, counseling/discussion D/w mom. Mom aware that long term prognosis from cirrhosis poor and renal dysfunction means evern poorer prognosis  Plan  - chaplain consult for annointing her - full medical care + short term intubation + pressors ok but no LTAC, trach, peg, no CPR/defib      FAMILY  - Updates: 12/26/2017 --> mom at bedside in 4thr  Floor [place]  - Inter-disciplinary family meet or Palliative Care meeting due by:  DAy 7. Current LOS is LOS 4 days  CODE STATUS    Code Status Orders  (From admission, onward)  Start     Ordered   12/23/17 0020  Full code  Continuous     12/23/17 0019    Code Status History    Date Active Date Inactive Code Status Order ID Comments User Context   12/07/2017 02:34 12/16/2017 19:46  Full Code [id number]  Gery Pray, MD ED        DISPO r Transfer to icu      The patient is critically ill with multiple organ systems failure and requires high complexity decision making for assessment and support, frequent evaluation and titration of therapies, application of advanced monitoring technologies and extensive interpretation of multiple databases.   Critical Care Time devoted to patient care services described in this note is  40  Minutes. This time reflects time of care of this signee Dr Kalman Shan. This critical care time does not reflect procedure time, or teaching time or supervisory time of PA/NP/Med student/Med Resident etc but could involve care discussion time   Dr. Kalman Shan, M.D., [facility].C.P Pulmonary and Critical Care Medicine Staff Physician, [facility] Director - Interstitial Lung Disease  Program  Pulmonary Fibrosis [facility] at [facility] [place], [place], [address]  Pager: [phone], If no answer or between  15:00h - 7:00h: call [phone] Telephone: [phone]

## 2017-12-26 NOTE — Assessment & Plan Note (Addendum)
Completed zosyn   Antibiotics Given (last 72 hours)    Date/Time Action Medication Dose Rate   12/29/17 1411 New Bag/Given   piperacillin-tazobactam (ZOSYN) IVPB 3.375 g 3.375 g 12.5 mL/hr   12/29/17 1536 Given   rifaximin (XIFAXAN) tablet 400 mg 400 mg    12/29/17 2116 New Bag/Given   piperacillin-tazobactam (ZOSYN) IVPB 3.375 g 3.375 g 12.5 mL/hr   12/29/17 2119 Given   rifaximin (XIFAXAN) tablet 400 mg 400 mg    12/30/17 0641 New Bag/Given   piperacillin-tazobactam (ZOSYN) IVPB 3.375 g 3.375 g 12.5 mL/hr   12/30/17 1032 Given   rifaximin (XIFAXAN) tablet 400 mg 400 mg    12/30/17 1824 Given  [pt in procedure]   rifaximin (XIFAXAN) tablet 400 mg 400 mg    12/30/17 2208 Given   rifaximin (XIFAXAN) tablet 400 mg 400 mg    12/31/17 1045 Given   rifaximin (XIFAXAN) tablet 400 mg 400 mg    12/31/17 1414 New Bag/Given   piperacillin-tazobactam (ZOSYN) IVPB 3.375 g 3.375 g 12.5 mL/hr   12/31/17 1657 Given   rifaximin (XIFAXAN) tablet 400 mg 400 mg    12/31/17 2159 Given   rifaximin (XIFAXAN) tablet 400 mg 400 mg    01/01/18 0017 New Bag/Given   piperacillin-tazobactam (ZOSYN) IVPB 3.375 g 3.375 g 12.5 mL/hr   01/01/18 0743 New Bag/Given   piperacillin-tazobactam (ZOSYN) IVPB 3.375 g 3.375 g 12.5 mL/hr   01/01/18 0954 Given   rifaximin (XIFAXAN) tablet 400 mg 400 mg

## 2017-12-26 NOTE — Assessment & Plan Note (Addendum)
MELD improved . Making stool with lactulose but no improvement with encephalopathy  Plan Continue predcedex

## 2017-12-26 NOTE — Progress Notes (Signed)
eLink Physician-Brief Progress Note Patient Name: Bebe Literaige Slattery DOB: 07-08-1971 MRN: [id number]   Date of Service  12/26/2017  HPI/Events of Note  Nofified of need for DVT prophylaxis. History of liver disease INR = 1.51.   eICU Interventions  Will order SCDs to bilateral lower extremities.      Intervention Category Intermediate Interventions: Best-practice therapies (e.g. DVT, beta blocker, etc.)  Nikolaj Geraghty [place] 12/26/2017, 8:04 PM

## 2017-12-26 NOTE — Assessment & Plan Note (Addendum)
Await liver bx If wilson mom might pursue chelation If etoh on bx -> then she will considedr terminal care  Continue lactulose and xifaxan

## 2017-12-26 NOTE — Progress Notes (Signed)
Peripherally Inserted Central Catheter/Midline Placement  The IV Nurse has discussed with the patient and/or persons authorized to consent for the patient, the purpose of this procedure and the potential benefits and risks involved with this procedure.  The benefits include less needle sticks, lab draws from the catheter, and the patient may be discharged home with the catheter. Risks include, but not limited to, infection, bleeding, blood clot (thrombus formation), and puncture of an artery; nerve damage and irregular heartbeat and possibility to perform a PICC exchange if needed/ordered by physician.  Alternatives to this procedure were also discussed.  Bard Power PICC patient education guide, fact sheet on infection prevention and patient information card has been provided to patient /or left at bedside.  Consent signed by mother due to altered mental status.  PICC/Midline Placement Documentation  PICC Triple Lumen 12/26/17 PICC Left Basilic 43 cm 1 cm (Active)  Indication for Insertion or Continuance of Line Vasoactive infusions 12/26/2017  6:30 PM  Exposed Catheter (cm) 1 cm 12/26/2017  6:30 PM  Site Assessment Clean;Dry;Intact 12/26/2017  6:30 PM  Lumen #1 Status Flushed;Saline locked;Blood return noted 12/26/2017  6:30 PM  Lumen #2 Status Flushed;Saline locked;Blood return noted 12/26/2017  6:30 PM  Lumen #3 Status Flushed;Saline locked;Blood return noted 12/26/2017  6:30 PM  Dressing Type Transparent 12/26/2017  6:30 PM  Dressing Status Clean;Dry;Intact;Antimicrobial disc in place 12/26/2017  6:30 PM  Dressing Change Due 01/02/18 12/26/2017  6:30 PM       Latima Hamza, Lajean ManesKerry Loraine 12/26/2017, 6:30 PM

## 2017-12-27 ENCOUNTER — Inpatient Hospital Stay (HOSPITAL_COMMUNITY): Payer: BLUE CROSS/BLUE SHIELD

## 2017-12-27 DIAGNOSIS — F1010 Alcohol abuse, uncomplicated: Secondary | ICD-10-CM

## 2017-12-27 DIAGNOSIS — J811 Chronic pulmonary edema: Secondary | ICD-10-CM

## 2017-12-27 DIAGNOSIS — E878 Other disorders of electrolyte and fluid balance, not elsewhere classified: Secondary | ICD-10-CM

## 2017-12-27 DIAGNOSIS — K7031 Alcoholic cirrhosis of liver with ascites: Secondary | ICD-10-CM

## 2017-12-27 DIAGNOSIS — K7290 Hepatic failure, unspecified without coma: Principal | ICD-10-CM

## 2017-12-27 DIAGNOSIS — F209 Schizophrenia, unspecified: Secondary | ICD-10-CM

## 2017-12-27 DIAGNOSIS — K7030 Alcoholic cirrhosis of liver without ascites: Secondary | ICD-10-CM

## 2017-12-27 DIAGNOSIS — E872 Acidosis, unspecified: Secondary | ICD-10-CM | POA: Insufficient documentation

## 2017-12-27 DIAGNOSIS — G9341 Metabolic encephalopathy: Secondary | ICD-10-CM

## 2017-12-27 DIAGNOSIS — Z79899 Other long term (current) drug therapy: Secondary | ICD-10-CM

## 2017-12-27 DIAGNOSIS — L8990 Pressure ulcer of unspecified site, unspecified stage: Secondary | ICD-10-CM

## 2017-12-27 LAB — MAGNESIUM: Magnesium: 1.6 mg/dL — ABNORMAL LOW (ref 1.7–2.4)

## 2017-12-27 LAB — CULTURE, BLOOD (ROUTINE X 2)
Culture: NO GROWTH
Special Requests: ADEQUATE

## 2017-12-27 LAB — GLUCOSE, CAPILLARY: GLUCOSE-CAPILLARY: 154 mg/dL — AB (ref 65–99)

## 2017-12-27 LAB — PHOSPHORUS: Phosphorus: 3.2 mg/dL (ref 2.5–4.6)

## 2017-12-27 LAB — PROCALCITONIN: PROCALCITONIN: 0.47 ng/mL

## 2017-12-27 MED ORDER — FUROSEMIDE 10 MG/ML IJ SOLN
40.0000 mg | Freq: Once | INTRAMUSCULAR | Status: AC
  Administered 2017-12-27: 40 mg via INTRAVENOUS
  Filled 2017-12-27: qty 4

## 2017-12-27 MED ORDER — MAGNESIUM SULFATE 2 GM/50ML IV SOLN
2.0000 g | Freq: Once | INTRAVENOUS | Status: AC
  Administered 2017-12-27: 2 g via INTRAVENOUS
  Filled 2017-12-27: qty 50

## 2017-12-27 MED ORDER — LITHIUM CITRATE 300 MG/5 ML PO SYRP
300.0000 mg | Freq: Two times a day (BID) | ORAL | Status: DC
  Administered 2017-12-27 – 2017-12-29 (×6): 300 mg
  Filled 2017-12-27 (×8): qty 5

## 2017-12-27 MED ORDER — LURASIDONE HCL 20 MG PO TABS
60.0000 mg | ORAL_TABLET | Freq: Every day | ORAL | Status: DC
  Administered 2017-12-27 – 2017-12-29 (×3): 60 mg
  Filled 2017-12-27 (×3): qty 3

## 2017-12-27 MED ORDER — LITHIUM CARBONATE ER 300 MG PO TBCR
300.0000 mg | EXTENDED_RELEASE_TABLET | Freq: Two times a day (BID) | ORAL | Status: DC
  Filled 2017-12-27: qty 1

## 2017-12-27 MED ORDER — LAMOTRIGINE 100 MG PO TABS: 100.0000 mg | ORAL_TABLET | Freq: Every day | ORAL | Status: DC

## 2017-12-27 MED ORDER — POTASSIUM CHLORIDE 20 MEQ/15ML (10%) PO SOLN
40.0000 meq | ORAL | Status: AC
  Administered 2017-12-27 (×2): 40 meq
  Filled 2017-12-27 (×2): qty 30

## 2017-12-27 MED ORDER — LURASIDONE HCL 20 MG PO TABS: 60.0000 mg | ORAL_TABLET | Freq: Every day | ORAL | Status: DC

## 2017-12-27 MED ORDER — LIDOCAINE HCL 2 % EX GEL
1.0000 "application " | Freq: Once | CUTANEOUS | Status: DC
  Filled 2017-12-27: qty 5

## 2017-12-27 MED ORDER — LIDOCAINE 5 % EX PTCH: 1.0000 | MEDICATED_PATCH | CUTANEOUS | Status: DC

## 2017-12-27 MED ORDER — ORAL CARE MOUTH RINSE
15.0000 mL | Freq: Two times a day (BID) | OROMUCOSAL | Status: DC
  Administered 2017-12-27 – 2018-01-03 (×14): 15 mL via OROMUCOSAL

## 2017-12-27 MED ORDER — IBUPROFEN 100 MG/5ML PO SUSP: 400.0000 mg | Freq: Three times a day (TID) | ORAL | Status: DC

## 2017-12-27 MED ORDER — BENZOCAINE 20 % MT AERO
INHALATION_SPRAY | Freq: Once | OROMUCOSAL | Status: DC
  Filled 2017-12-27: qty 57

## 2017-12-27 MED ORDER — LAMOTRIGINE 100 MG PO TABS
100.0000 mg | ORAL_TABLET | Freq: Every day | ORAL | Status: DC
  Administered 2017-12-27 – 2018-01-01 (×6): 100 mg
  Filled 2017-12-27 (×6): qty 1

## 2017-12-27 NOTE — Assessment & Plan Note (Addendum)
She is been seen by psychiatry, very much appreciate their recommendations. Plan Cont  lithium 300 mg twice daily Lamictal  100 mg every at bedtime Reduce  Latuda to 40 mg If no improvement we can discontinue Latuda and increase lithium to 450 twice daily

## 2017-12-27 NOTE — Progress Notes (Signed)
RN attempted NG placement. Abdominal xray completed and tube not seen on xray. Pt respiratory status began to decrease. RN removed NG tube. RN will continue to monitor.

## 2017-12-27 NOTE — Progress Notes (Signed)
PROGRESS NOTE    Amy Powers  ZOX:[id number] DOB: 1971/08/29 DOA: January 20, 2018 PCP: Pearline Cables, MD   Brief Narrative: Patient is a 47 year old female with past medical history of schizophrenia, hypothyroidism, alcohol abuse, alcoholic liver disease with probable cirrhosis.  She was recently admitted and discharged on 12/16/17 after management of hepatic encephalopathy.  Patient was referred by GI and was started on Lasix, lispro Aldactone and lactulose along with prednisolone for alcoholic hepatitis.  She presented to the emergency department again on 01/20/18 for altered mental status.  It was unclear if patient was taking any of her medications after discharge. Patient was admitted for the management of altered mental status and started on lactulose.  She was also found to have ascites and underwent paracentesis on 12/23/17. Patient's mental status started deteriorating  On 12/26/17.  She was very agitated and confused. PCCM  consulted.  She was moved to SDU.  Assessment & Plan:   Active Problems:   Schizophrenia (HCC)   Encephalopathy acute   Hypothyroidism   HCAP (healthcare-associated pneumonia)   Cirrhosis, alcoholic (HCC)   Goals of care, counseling/discussion   Pressure injury of skin   Electrolyte and fluid disorder   Metabolic acidosis   Pulmonary edema  Acute metabolic encephalopathy/hepatic encephalopathy:Currently sedated with Precedex Hepatic encephalopathy was suspected on admission.  Patient underwent MRI of the brain which was negative for any acute intracranial abnormalities.  EEG was negative for seizures. Her ammonia level was normal on presentation but found to be elevated on 12/26/17.  She is on lactulose. Rifaximin is also added . There was also concern for Warnicke's encephalopathy.  Patient has been started on IV thiamine. We will continue to monitor mental status. She might need to be intubated for the protection of her airway if she does not improvePCCM  following.  HCAP: X-ray done was suggestive of  pneumonia.  Will treat as healthcare associated  pneumonia.  Started on vancomycin and Zosyn.  We will follow-up cultures.negatuive so far. Currently just on Zosyn. CXR done on 12/27/17 showed diffuse bilateral pulmonary infiltrates/edema as before.  Alcoholic hepatitis/cirrhosis/ascites: Patient's last drink was before her last admission. Abdomen was was found to be very distended on this presentation.  Patient also has 2-3+ pitting edema.   She is status post paracentesis with removal of 2.7 L of fluid on 12/21/17.  No signs of SBP on peritoneal fluid analysis.  Culture -NGTD Patient is on Lasix and spironolactone.  Continue prednisolone as recommended by GI during recent hospitalization.  She is on 40 mg daily for total of 4 weeks then will have 2 weeks taper. MELD score 16. Gastroenterology reconsulted . Lasix increased to 40 mg twice a day today because of anasarca.  Albumin also administered.  Leukocytosis: Most likely secondary to steroids.  We will continue to monitor the trend.   Lactic acid mildly elevated. We will follow-up cultures  Schizophrenia: On Lamictal , Latuda,Lthium at home. Latuda and Lithium held due to altered mental status.Psychiatry consult requested by PCCM  Hypothyroidism: Continue Synthyroid  Hypokalemia: We will continue to replace as needed.Continue to monitor levels.   DVT prophylaxis: SCD Code Status: Full Family Communication:Mother present at the bedside Disposition Plan: Depends on clinical outcome   Consultants: None  Procedures: Paracentesis on 12/23/17  Antimicrobials: Zosyn since 12/24/17  Subjective: Patient seen and examined the bedside this morning.  Remains sedated with Precedex.  Mother on the bedside  Objective: Vitals:   12/27/17 0730 12/27/17 0755 12/27/17 0800 12/27/17 0900  BP:  98/68  93/61 (!) 91/51  Pulse: (!) 53  (!) 58 (!) 51  Resp: (!) 25  20 (!) 30  Temp:  98 F (36.7 C)      TempSrc:  Oral    SpO2: 90%  (!) 89% 91%  Weight:      Height:        Intake/Output Summary (Last 24 hours) at 12/27/2017 1207 Last data filed at 12/27/2017 0511 Gross per 24 hour  Intake 431.53 ml  Output 2500 ml  Net -2068.47 ml   Filed Weights   12/26/17 0414 12/26/17 1234 12/27/17 0352  Weight: 91.5 kg (201 lb 11.5 oz) 88.7 kg (195 lb 8.8 oz) 87.5 kg (192 lb 14.4 oz)    Examination:  General exam: Not in distress,sedated HEENT:PERRL,Oral mucosa moist, Ear/Nose normal on gross exam Respiratory system: Bilateral decreased air entry  cardiovascular system: S1 & S2 heard, RRR. No JVD, murmurs, rubs, gallops or clicks. Gastrointestinal system: Abdomen is distended, soft and nontender. No organomegaly or masses felt. Normal bowel sounds heard. Central nervous system: Not Alert and oriented. No focal neurological deficits. Extremities: Anasarca, no clubbing ,no cyanosis, distal peripheral pulses palpable. Skin: No rashes, lesions or ulcers,no icterus ,no pallor MSK: Normal muscle bulk,tone ,power   Data Reviewed: I have personally reviewed following labs and imaging studies  CBC: Recent Labs  Lab 12/18/2017 1225 12/09/2017 1345 12/24/17 0449 12/25/17 0509 12/26/17 0711  WBC 22.0*  --  14.5* 19.8* 20.4*  NEUTROABS  --  14.4* 11.3* 15.4* 15.7*  HGB 11.2*  --  8.5* 9.6* 9.3*  HCT 33.1*  --  25.8* 30.2* 27.9*  MCV 114.9*  --  115.2* 117.1* 117.7*  PLT 200  --  193 211 174   Basic Metabolic Panel: Recent Labs  Lab 12/26/2017 1345 12/23/17 0352 12/24/17 0449 12/25/17 0509 12/26/17 0711  NA 129* 133* 135 137 138  K 2.9* 3.4* 3.7 3.6 3.3*  CL 102 104 106 108 110  CO2 19* 19* 19* 19* 18*  GLUCOSE 113* 124* 99 94 77  BUN 7 8 8 6 6   CREATININE 0.81 0.84 0.82 0.83 0.88  CALCIUM 7.6* 7.9* 8.6* 8.8* 8.6*  MG 2.0  --  2.1  --   --    GFR: Estimated Creatinine Clearance: 87.3 mL/min (by C-G formula based on SCr of 0.88 mg/dL). Liver Function Tests: Recent Labs  Lab  12/03/2017 1345 12/23/17 0352 12/24/17 0449 12/26/17 1238  AST 60* 60* 49* 48*  ALT 31 31 26 25   ALKPHOS 148* 154* 115 107  BILITOT 4.1* 4.5* 3.4* 4.3*  PROT 5.4* 5.2* 5.4* 4.7*  ALBUMIN 2.2* 2.1* 3.2* 2.7*   No results for input(s): LIPASE, AMYLASE in the last 168 hours. Recent Labs  Lab 12/11/2017 1345 12/25/17 1232 12/26/17 0711  AMMONIA 35 51* 30   Coagulation Profile: Recent Labs  Lab 12/21/2017 1345 12/23/17 0352  INR 1.47 1.51   Cardiac Enzymes: Recent Labs  Lab 12/10/2017 1345  CKTOTAL 49  TROPONINI <0.03   BNP (last 3 results) No results for input(s): PROBNP in the last 8760 hours. HbA1C: No results for input(s): HGBA1C in the last 72 hours. CBG: No results for input(s): GLUCAP in the last 168 hours. Lipid Profile: No results for input(s): CHOL, HDL, LDLCALC, TRIG, CHOLHDL, LDLDIRECT in the last 72 hours. Thyroid Function Tests: No results for input(s): TSH, T4TOTAL, FREET4, T3FREE, THYROIDAB in the last 72 hours. Anemia Panel: No results for input(s): VITAMINB12, FOLATE, FERRITIN, TIBC, IRON, RETICCTPCT in the  last 72 hours. Sepsis Labs: Recent Labs  Lab 12/23/17 0037 12/23/17 0352 12/25/17 0509 12/26/17 1238  PROCALCITON 0.28  --   --  0.36  LATICACIDVEN 2.8* 2.3* 1.9 2.3*    Recent Results (from the past 240 hour(s))  Culture, blood (routine x 2)     Status: None (Preliminary result)   Collection Time: 01-18-2018  3:39 PM  Result Value Ref Range Status   Specimen Description   Final    BLOOD LEFT ANTECUBITAL Performed at [facility], [address] [place], [place] [address]    Special Requests   Final    BOTTLES DRAWN AEROBIC AND ANAEROBIC Blood Culture adequate volume Performed at [facility], [address] [place], [place] [address]    Culture   Final    NO GROWTH 4 DAYS Performed at [facility], [address] [place], [place] [address]    Report Status PENDING  Incomplete  Body fluid culture      Status: None   Collection Time: 12/23/17 10:36 AM  Result Value Ref Range Status   Specimen Description   Final    PERITONEAL Performed at [facility], [address], [place], [place] [address]    Special Requests   Final    NONE Performed at [facility], [address], [place], [place] [address]    Gram Stain   Final    WBC PRESENT, PREDOMINANTLY PMN NO ORGANISMS SEEN CYTOSPIN SMEAR    Culture   Final    NO GROWTH 3 DAYS Performed at [facility], [address] [place], [place] [address]    Report Status 12/26/2017 FINAL  Final  MRSA PCR Screening     Status: None   Collection Time: 12/25/17  9:37 AM  Result Value Ref Range Status   MRSA by PCR NEGATIVE NEGATIVE Final    Comment:        The GeneXpert MRSA Assay (FDA approved for NASAL specimens only), is one component of a comprehensive MRSA colonization surveillance program. It is not intended to diagnose MRSA infection nor to guide or monitor treatment for MRSA infections. Performed at [facility], [address], [place], [place] [address]   Culture, blood (routine x 2)     Status: None (Preliminary result)   Collection Time: 12/25/17 12:32 PM  Result Value Ref Range Status   Specimen Description   Final    LEFT ANTECUBITAL Performed at [facility], [address] [place], [place] [address]    Special Requests   Final    IN PEDIATRIC BOTTLE Blood Culture adequate volume Performed at [facility], [address], [place], [place] [address]    Culture   Final    NO GROWTH < 24 HOURS Performed at [facility], [address] [place], [place] [address]    Report Status PENDING  Incomplete  Culture, blood (routine x 2)     Status: None (Preliminary result)   Collection Time: 12/25/17 12:33 PM  Result Value Ref Range Status   Specimen Description   Final    RIGHT ANTECUBITAL Performed at [facility], [address], [place], [place] [address]    Special Requests   Final    BOTTLES DRAWN AEROBIC AND ANAEROBIC Blood Culture adequate volume Performed at [facility], [address], [place], [place] [address]    Culture  Final    NO GROWTH < 24 HOURS Performed at [facility], [address], [place], [place] [address]    Report Status PENDING  Incomplete         Radiology Studies: Dg Abd 1 View  Result Date: 12/27/2017 CLINICAL DATA:  NG tube placement EXAM: ABDOMEN - 1 VIEW COMPARISON:  12/26/2017 FINDINGS: NG tube has been retracted with the tip now in the distal esophagus in the lower chest. IMPRESSION: NG tube tip in the distal esophagus in the lower chest. Electronically Signed   By: Charlett Nose M.D.   On: 12/27/2017 01:21   Dg Chest Port 1 View  Result Date: 12/27/2017 CLINICAL DATA:  Pulmonary edema. EXAM: PORTABLE CHEST 1 VIEW COMPARISON:  12/26/2017. FINDINGS: Heart size normal. Diffuse bilateral pulmonary infiltrates/edema again noted without interim change. No pleural effusion or pneumothorax. IMPRESSION: Diffuse bilateral pulmonary infiltrates/edema again noted without interim change. Electronically Signed   By: Maisie Fus  Register   On: 12/27/2017 10:34   Dg Chest Port 1 View  Result Date: 12/26/2017 CLINICAL DATA:  Alcoholic cirrhosis. Now with shortness of breath and signs of pneumonia. EXAM: PORTABLE CHEST 1 VIEW COMPARISON:  Chest x-ray of December 24, 2017 FINDINGS: Fluffy airspace opacities have developed bilaterally there is no pleural effusion. The heart is top-normal in size. The trachea is midline allowing for patient rotation. IMPRESSION: Fluffy airspace opacities most compatible with bilateral pulmonary interstitial and alveolar edema which may be of cardiac or noncardiac cause. Pneumonia cannot be excluded in the appropriate clinical setting. Electronically Signed   By: David  [place] M.D.   On: 12/26/2017 13:40     Dg Abd Portable 1v  Result Date: 12/26/2017 CLINICAL DATA:  NG tube placement. EXAM: PORTABLE ABDOMEN - 1 VIEW COMPARISON:  None FINDINGS: NG tube tip is at the gastroesophageal junction and needs to be advanced. Bowel gas pattern is within normal limits. Surgical clips scattered about the abdomen. Bones are normal. IMPRESSION: NG tube tip in the distal esophagus at the gastroesophageal junction. Electronically Signed   By: Francene Boyers M.D.   On: 12/26/2017 17:00        Scheduled Meds: . Chlorhexidine Gluconate Cloth  6 each Topical Daily  . folic acid  1 mg Per Tube Daily  . furosemide  40 mg Intravenous BID  . furosemide  40 mg Intravenous Once  . lactulose  30 g Per Tube TID  . lamoTRIgine  200 mg Per Tube QHS  . levothyroxine  100 mcg Per Tube QAC breakfast  . lidocaine  1 application Topical Once  . potassium chloride  40 mEq Per Tube Q4H  . prednisoLONE  40 mg Per Tube Daily  . rifaximin  550 mg Per Tube TID  . sodium chloride flush  10-40 mL Intracatheter Q12H  . spironolactone  50 mg Per Tube Daily   Continuous Infusions: . dexmedetomidine (PRECEDEX) IV infusion 0.8 mcg/kg/hr (12/27/17 1024)  . phenylephrine (NEO-SYNEPHRINE) Adult infusion 30 mcg/min (12/27/17 0905)  . piperacillin-tazobactam (ZOSYN)  IV Stopped (12/27/17 0911)  . thiamine injection Stopped (12/26/17 1935)     LOS: 5 days    Time spent: 25 mins      Wilfred Dayrit Salli Quarry, MD [facility] Pager [phone]  If 7PM-7AM, please contact night-coverage [email or web address] Password [facility] 12/27/2017, 12:07 PM

## 2017-12-27 NOTE — Consult Note (Signed)
Referring Provider: [facility]  Primary Care Physician:  Amy Mclean, MD Primary Gastroenterologist: unassigned  Reason for Consultation: hepatic encephalopathy   ASSESSMENT AND PLAN:   49. 47 yo female with recent admission for ETOH hepatitis. MRCP raised concern for cirrhosis.  Child Pugh C. Discharged home after recent admission on prednisolone but may not have been taking it. Scheduled to see [place] in office 01/05/18. Readmitted with AMS, edema, leukocytosis. Encephalopathy probably multifactorial (PNA, psychiatric, hepatic). Overall, liver labs stable. Bili 4.3. INR 1.51 -She is on precedex for delirium  / agitation. NGT tube to be placed and lactulose started. PCCM will then wean from precedex to reassess mental status and see if better after hepatic encephalopathy treatment.  -resume prednisolone once NGT placed.   -getting 18m BID of lasix. Aldactone only 50 mg daily. She has significant anasarca, some of which is likely from low albumin but aldactone dose should be increased when BP allows, especially to avoid hypokalemia with concurrent lasix.  -Blood and urine studies raise concern for Wilson's disease. Not feasible to do a liver biopsy right now but slip lamp to look for KF rings would be very helpful as if she has Wilson's then treatment could change her outcome. PCCM to call Ophthalmology   2. Leukocytosis. Etiology ?  Probably prednisolone vrs PNA. No SBP  HPI: Amy Powers a 47y.o. female [facility]. She has a significant psychiatric history. Mother in room and says she was diagnosed with Bipolar /depression in her 263's She is on 3 psych meds at home. [place] presented with AMS, ascites and coagulopathy when we saw her earlier this month. . [place]. Patient has hx of ETOH abuse, there was question of ETOH hepatitis but also lithium  induced liver injury. Encephalopathy to be multifactorial with lithium toxicity, hypothyroidism, UTI, possibly alcohol withdrawal .  Viral hep panel negative. MRCP negative.  Ceruloplasmin was low, urinary copper excretion results were inconclusive but certainly not negative . She improved with prednisolone and diuretics.  Patient was eventually discharged home on prednisone 40 mg daily for 4 weeks.  He was given a follow-up appointment [place]05[place]2018.   [name] for hospital follow-up on 12/29/2017.  In the office she was confused and tremulous.  She had worsening edema.  She was subsequently taken to the ED for admission . According to the H&P patient has not been all of her home medications.  She apparently ran out of lactulose and was unable to locate her prednisolone. She has had 2.7 L of peritoneal fluid removed admission, no evidence for SBP  Chest x-ray suggest HCAP, started on antibiotics.  Mental status has deteriorated, patient became more agitated and delirious.  PCCM initiated Precedex.     Past Medical History:  Diagnosis Date  . Alcohol abuse   . Depression   . Hypothyroidism   . [place]   . Migraines   . Schizophrenia ([facility]     Past Surgical History:  Procedure Laterality Date  . CHOLECYSTECTOMY  1993  . GASTRIC BYPASS  1993  . IR PARACENTESIS  12/23/2017    Prior to Admission medications   Medication Sig Start Date End Date Taking? Authorizing Provider  folic acid (FOLVITE) 1 MG tablet Take 1 tablet (1 mg total) by mouth daily. 12/17/17  Yes Lavina Hamman, MD  furosemide (LASIX) 20 MG tablet Take 1 tablet (20 mg total) by mouth daily. 12/17/17  Yes Lavina Hamman, MD  lamoTRIgine (LAMICTAL) 200 MG tablet Take 200 mg by mouth at bedtime. 10/05/17  Yes [provider]  LATUDA 60 MG TABS Take 60 mg by mouth at bedtime. 10/05/17  Yes [provider]  lithium carbonate (LITHOBID) 300 MG CR tablet Take 300 mg by mouth 2 (two)  times daily. 10/05/17  Yes [provider]  spironolactone (ALDACTONE) 50 MG tablet Take 1 tablet (50 mg total) by mouth daily. 12/17/17  Yes Lavina Hamman, MD  SYNTHROID 100 MCG tablet Take 100 mcg by mouth daily. 09/13/17  Yes [provider]  thiamine 100 MG tablet Take 1 tablet (100 mg total) by mouth daily. 12/17/17  Yes Lavina Hamman, MD    Current Facility-Administered Medications  Medication Dose Route Frequency Provider Last Rate Last Dose  . Chlorhexidine Gluconate Cloth 2 % PADS 6 each  6 each Topical Daily Adhikari Bk, Amrit, MD      . dexmedetomidine (PRECEDEX) 200 MCG/50ML (4 mcg/mL) infusion  0.4-1.4 mcg/kg/hr Intravenous Titrated Brand Males, MD 18.3 mL/hr at 12/27/17 1024 0.8 mcg/kg/hr at 12/27/17 1024  . folic acid (FOLVITE) tablet 1 mg  1 mg Per Tube Daily Erick Colace, NP      . furosemide (LASIX) injection 40 mg  40 mg Intravenous BID Marene Lenz, MD   40 mg at 12/27/17 0903  . furosemide (LASIX) injection 40 mg  40 mg Intravenous Once Erick Colace, NP      . lactulose (CHRONULAC) 10 GM/15ML solution 30 g  30 g Per Tube TID Erick Colace, NP      . lamoTRIgine (LAMICTAL) tablet 200 mg  200 mg Per Tube QHS Erick Colace, NP      . levothyroxine (SYNTHROID, LEVOTHROID) tablet 100 mcg  100 mcg Per Tube QAC breakfast Erick Colace, NP      . lidocaine (XYLOCAINE) 2 % jelly 1 application  1 application Topical Once Erick Colace, NP      . ondansetron [facility]) injection 4 mg  4 mg Intravenous Q6H PRN Karmen Bongo, MD      . phenylephrine (NEOSYNEPHRINE) 10-0.9 MG/250ML-% infusion  0-400 mcg/min Intravenous Titrated Erick Colace, NP 45 mL/hr at 12/27/17 0905 30 mcg/min at 12/27/17 0905  . piperacillin-tazobactam (ZOSYN) IVPB 3.375 g  3.375 g Intravenous Q8H Eudelia Bunch, RPH   Stopped at 12/27/17 8657  . potassium chloride 20 MEQ/15ML (10%) solution 40 mEq  40 mEq Per Tube Q4H Erick Colace, NP      . prednisoLONE  tablet 40 mg  40 mg Per Tube Daily Erick Colace, NP      . rifaximin Doreene Nest) tablet 550 mg  550 mg Per Tube TID Erick Colace, NP      . sodium chloride flush (NS) 0.9 % injection 10-40 mL  10-40 mL Intracatheter Q12H Marene Lenz, MD   10 mL at 12/26/17 2131  . sodium chloride flush (NS) 0.9 % injection 10-40 mL  10-40 mL Intracatheter PRN Jodie Echevaria, Amrit, MD      . spironolactone (ALDACTONE) tablet 50 mg  50 mg Per Tube Daily Erick Colace, NP      . thiamine 542m in normal saline (535m IVPB  500 mg Intravenous Q24H YaKarmen BongoMD   Stopped at 12/26/17 1935  Allergies as of 12/10/2017  . (No Known Allergies)    Family History  Problem Relation Age of Onset  . Arthritis Mother   . Diabetes Mother   . Hearing loss Mother   . Hypertension Mother   . Cancer Father   . COPD Father   . Hearing loss Father   . Heart disease Father   . Arthritis Maternal Grandmother   . Depression Maternal Grandmother   . Hearing loss Maternal Grandmother   . Heart attack Maternal Grandmother   . Heart disease Maternal Grandmother   . Hypertension Maternal Grandmother   . Arthritis Maternal Grandfather   . Alcohol abuse Paternal Grandmother   . Cancer Paternal Grandmother   . Alcohol abuse Paternal Grandfather   . Cancer Paternal Grandfather     Social History   Socioeconomic History  . Marital status: Divorced    Spouse name: Not on file  . Number of children: Not on file  . Years of education: Not on file  . Highest education level: Not on file  Social Needs  . Financial resource strain: Not on file  . Food insecurity - worry: Not on file  . Food insecurity - inability: Not on file  . Transportation needs - medical: Not on file  . Transportation needs - non-medical: Not on file  Occupational History  . Not on file  Tobacco Use  . Smoking status: Former Smoker    Last attempt to quit: 1996    Years since quitting: 23.1  . Smokeless tobacco: Never Used    Substance and Sexual Activity  . Alcohol use: Yes    Alcohol/week: 3.6 oz    Types: 6 Glasses of wine per week    Comment: pt reports 1 full bottle per day  . Drug use: No  . Sexual activity: Not on file  Other Topics Concern  . Not on file  Social History Narrative  . Not on file    Review of Systems: All systems reviewed and negative except where noted in HPI.  Physical Exam: Vital signs in last 24 hours: Temp:  [97.6 F (36.4 C)-99.8 F (37.7 C)] 98 F (36.7 C) (02/26 0755) Pulse Rate:  [50-102] 51 (02/26 0900) Resp:  [15-31] 30 (02/26 0900) BP: (60-139)/(20-93) 91/51 (02/26 0900) SpO2:  [89 %-98 %] 91 % (02/26 0900) Weight:  [192 lb 14.4 oz (87.5 kg)-195 lb 8.8 oz (88.7 kg)] 192 lb 14.4 oz (87.5 kg) (02/26 0352) Last BM Date: 12/26/17 General:   Sedated pale white female in NAD. In soft restraints Psych:  Cannot assess Eyes:  Pupils equal, mild icterus.  Nose:  No deformity, discharge,  or lesions. Neck:  Supple; no masses Lungs:  Clear throughout to auscultation.   No wheezes, crackles, or rhonchi.  Heart:  Regular rate and rhythm; no murmurs, no edema Abdomen:  Soft, mildly distended, tympanitic,  A few bowel sounds. No palp mass    Msk:  Symmetrical without gross deformities. . Neurologic:  Sedated.  Skin:  Intact without significant lesions or rashes..   Intake/Output from previous day: 02/25 0701 - 02/26 0700 In: 531.5 [I.V.:281.5; IV Piggyback:250] Out: 3400 [Urine:3400] Intake/Output this shift: No intake/output data recorded.  Lab Results: Recent Labs    12/25/17 0509 12/26/17 0711  WBC 19.8* 20.4*  HGB 9.6* 9.3*  HCT 30.2* 27.9*  PLT 211 174   BMET Recent Labs    12/25/17 0509 12/26/17 0711  NA 137 138  K 3.6 3.3*  CL 108 110  CO2 19* 18*  GLUCOSE 94 77  BUN 6 6  CREATININE 0.83 0.88  CALCIUM 8.8* 8.6*   LFT Recent Labs    12/26/17 1238  PROT 4.7*  ALBUMIN 2.7*  AST 48*  ALT 25  ALKPHOS 107  BILITOT 4.3*  BILIDIR 1.5*   IBILI 2.8*    Studies/Results: Dg Abd 1 View  Result Date: 12/27/2017 CLINICAL DATA:  NG tube placement EXAM: ABDOMEN - 1 VIEW COMPARISON:  12/26/2017 FINDINGS: NG tube has been retracted with the tip now in the distal esophagus in the lower chest. IMPRESSION: NG tube tip in the distal esophagus in the lower chest. Electronically Signed   By: Rolm Baptise M.D.   On: 12/27/2017 01:21   Dg Chest Port 1 View  Result Date: 12/27/2017 CLINICAL DATA:  Pulmonary edema. EXAM: PORTABLE CHEST 1 VIEW COMPARISON:  12/26/2017. FINDINGS: Heart size normal. Diffuse bilateral pulmonary infiltrates/edema again noted without interim change. No pleural effusion or pneumothorax. IMPRESSION: Diffuse bilateral pulmonary infiltrates/edema again noted without interim change. Electronically Signed   By: Marcello Moores  Register   On: 12/27/2017 10:34   Dg Chest Port 1 View  Result Date: 12/26/2017 CLINICAL DATA:  Alcoholic cirrhosis. Now with shortness of breath and signs of pneumonia. EXAM: PORTABLE CHEST 1 VIEW COMPARISON:  Chest x-ray of December 24, 2017 FINDINGS: Fluffy airspace opacities have developed bilaterally there is no pleural effusion. The heart is top-normal in size. The trachea is midline allowing for patient rotation. IMPRESSION: Fluffy airspace opacities most compatible with bilateral pulmonary interstitial and alveolar edema which may be of cardiac or noncardiac cause. Pneumonia cannot be excluded in the appropriate clinical setting. Electronically Signed   By: David  Martinique M.D.   On: 12/26/2017 13:40   Dg Abd Portable 1v  Result Date: 12/26/2017 CLINICAL DATA:  NG tube placement. EXAM: PORTABLE ABDOMEN - 1 VIEW COMPARISON:  None FINDINGS: NG tube tip is at the gastroesophageal junction and needs to be advanced. Bowel gas pattern is within normal limits. Surgical clips scattered about the abdomen. Bones are normal. IMPRESSION: NG tube tip in the distal esophagus at the gastroesophageal junction.  Electronically Signed   By: Lorriane Shire M.D.   On: 12/26/2017 17:00     Tye Savoy, NP-C @  12/27/2017, 12:02 PM  Pager number [phone]

## 2017-12-27 NOTE — Consult Note (Signed)
[facility] Face-to-Face Psychiatry Consult   Reason for Consult:  Medication management  Referring Physician:  Dr. Tawanna Solo Patient Identification: Amy Powers MRN:  [id number] Principal Diagnosis: Medication management Diagnosis:   Patient Active Problem List   Diagnosis Date Noted  . Pressure injury of skin [L89.90] 12/27/2017  . Electrolyte and fluid disorder [E87.8] 12/27/2017  . Metabolic acidosis [I14.4] 12/27/2017  . Pulmonary edema [J81.1] 12/27/2017  . Cirrhosis, alcoholic ([place]) [R15.40] 08/67/6195  . Goals of care, counseling/discussion [Z71.89] 12/26/2017  . HCAP (healthcare-associated pneumonia) [J18.9] 12/25/2017  . Hepatic failure due to alcoholism ([place]) [K70.40] 12/23/2017  . Hypothyroidism [E03.9] 12/23/2017  . Alcoholic hepatitis with ascites [K70.11]   . Encephalopathy acute [G93.40]   . Elevated liver enzymes [R74.8]   . Altered mental status [R41.82] 12/06/2017  . Hypotension [I95.9] 12/06/2017  . Rhabdomyolysis [M62.82] 12/06/2017  . Schizophrenia ([place]) [F20.9] 12/06/2017  . Leukocytosis [D72.829] 12/06/2017  . Hyperkalemia [E87.5] 12/06/2017  . Elevated INR [R79.1] 12/06/2017    Total Time spent with patient: 1 hour  Subjective:   Amy Powers is a 47 y.o. female patient admitted with acute metabolic encephalopathy.  HPI:   Per chart review, patient has a history of schizophrenia and alcohol abuse with alcoholic liver disease and probable cirrhosis. She was recently discharged from the hospital on 2/15 for ascites, anasarca and hepatic encephalopathy. She lost her job in December 2018 and became depressed. Her mother believes that she has been drinking. She has a lack of social support in [place]. Family lives in [place] and [place]. She divorced 4 years ago. She is prescribed Lithium 300 mg BID, Latuda 60 mg qhs and Lamictal 200 mg qhs. She is not receiving Latuda or Lithium in the hospital.    Amy Powers was unable to participate in interview since she was  sedated and unable to be aroused. Her mother was present at bedside and provided her history. She reports that her daughter was diagnosed with bipolar disorder in her 53s. She is followed by Dr. Farris Has. She was started on Lithium and Latuda 2 years ago. She was doing relatively well until losing her job in December. Her mood declined and she increased her alcohol use. She stopped caring for herself. 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 She told her her mother when she first presented to the hospital that she had changed her perspective of life secondary to hospitalization. She felt better and desired to quit drinking so that she could do better.   Past Psychiatric History: Bipolar disorder. Mother reports that she has been manic in the past. She denies a history of psychosis.   Risk to Self: Is patient at risk for suicide?: No Risk to Others:   UTA since patient is sedated and unable to arouse.  Prior Inpatient Therapy:  She was hospitalized in 2014 for a suicide attempt by overdose in the setting a her divorce and her father was dying.  Prior Outpatient Therapy:  She is followed by Dr. Farris Has.   Past Medical History:  Past Medical History:  Diagnosis Date  . Alcohol abuse   . Depression   . Hypothyroidism   . Liver failure ([place])   . Migraines   . Schizophrenia [facility])     Past Surgical History:  Procedure Laterality Date  . CHOLECYSTECTOMY  1993  . GASTRIC BYPASS  1993  . IR PARACENTESIS  12/23/2017   Family History:  Family History  Problem Relation Age of Onset  . Arthritis Mother   . Diabetes Mother   . Hearing loss Mother   . Hypertension Mother   . Cancer Father   . COPD Father   . Hearing loss Father   . Heart disease Father   . Arthritis Maternal Grandmother   . Depression Maternal Grandmother   . Hearing loss Maternal Grandmother   . Heart attack Maternal Grandmother   . Heart disease Maternal  Grandmother   . Hypertension Maternal Grandmother   . Arthritis Maternal Grandfather   . Alcohol abuse Paternal Grandmother   . Cancer Paternal Grandmother   . Alcohol abuse Paternal Grandfather   . Cancer Paternal Grandfather    Family Psychiatric  History: Maternal grandmother-BPAD and received ECT treatments. Per chart review, paternal grandmother and grandfather with alcoholism. Social History:  Social History   Substance and Sexual Activity  Alcohol Use Yes  . Alcohol/week: 3.6 oz  . Types: 6 Glasses of wine per week   Comment: pt reports 1 full bottle per day     Social History   Substance and Sexual Activity  Drug Use No    Social History   Socioeconomic History  . Marital status: Divorced    Spouse name: None  . Number of children: None  . Years of education: None  . Highest education level: None  Social Needs  . Financial resource strain: None  . Food insecurity - worry: None  . Food insecurity - inability: None  . Transportation needs - medical: None  . Transportation needs - non-medical: None  Occupational History  . None  Tobacco Use  . Smoking status: Former Smoker    Last attempt to quit: 1996    Years since quitting: 23.1  . Smokeless tobacco: Never Used  Substance and Sexual Activity  . Alcohol use: Yes    Alcohol/week: 3.6 oz    Types: 6 Glasses of wine per week    Comment: pt reports 1 full bottle per day  . Drug use: No  . Sexual activity: None  Other Topics Concern  . None  Social History Narrative  . None   Additional Social History: She lives alone. She has lived in [place] for 2 years. She moved from [place] after her divorce. She has no children. She is unemployed. 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 She has a history of heavy alcohol use and recent DTs. Mother denies illicit substance use.     Allergies:  No Known Allergies  Labs:  Results for orders placed or performed during the hospital encounter  of 12/06/2017 (from the past 48 hour(s))  Ammonia     Status: Abnormal   Collection Time: 12/25/17 12:32 PM  Result Value Ref Range   Ammonia 51 (H) 9 - 35 umol/L    Comment: Performed at [facility], [place] [address] [place], [place] [address]  Culture, blood (routine x 2)     Status: None (Preliminary result)   Collection Time: 12/25/17 12:32 PM  Result Value Ref Range   Specimen Description      LEFT ANTECUBITAL Performed at [facility], [place] [address], [place], [place] [address]    Special Requests      IN PEDIATRIC BOTTLE Blood Culture adequate volume Performed at [place] [address], [place], [place] [address]    Culture      NO GROWTH < 24 HOURS Performed at [facility], [place] [address], [place], [place] [address]    Report Status PENDING   Culture, blood (routine x 2)     Status: None (Preliminary result)   Collection Time: 12/25/17 12:33 PM  Result Value Ref Range   Specimen Description      RIGHT ANTECUBITAL Performed at [facility], [place] [address], [place], [place] [address]    Special Requests      BOTTLES DRAWN AEROBIC AND ANAEROBIC Blood Culture adequate volume Performed at [place] [address], [place], [place] [address]    Culture      NO GROWTH < 24 HOURS Performed at [place] [address] [place], [place] [address]    Report Status PENDING   CBC with Differential/Platelet     Status: Abnormal   Collection Time: 12/26/17  7:11 AM  Result Value Ref Range   WBC 20.4 (H) 4.0 - 10.5 K/uL   RBC 2.37 (L) 3.87 - 5.11 MIL/uL   Hemoglobin 9.3 (L) 12.0 - 15.0 g/dL   HCT 27.9 (L) 36.0 - 46.0 %   MCV 117.7 (H) 78.0 - 100.0 fL   MCH 39.2 (H) 26.0 - 34.0 pg   MCHC 33.3 30.0 - 36.0 g/dL   RDW 14.5 11.5 - 15.5 %   Platelets 174 150 - 400 K/uL   Neutrophils Relative % 77 %   Lymphocytes Relative 16 %   Monocytes Relative 5 %    Eosinophils Relative 2 %   Basophils Relative 0 %   Neutro Abs 15.7 (H) 1.7 - 7.7 K/uL   Lymphs Abs 3.3 0.7 - 4.0 K/uL   Monocytes Absolute 1.0 0.1 - 1.0 K/uL   Eosinophils Absolute 0.4 0.0 - 0.7 K/uL   Basophils Absolute 0.0 0.0 - 0.1 K/uL   RBC Morphology TARGET CELLS     Comment: Performed at [facility], [place] [address], [place], [place] [address]  Basic metabolic panel     Status: Abnormal   Collection Time: 12/26/17  7:11 AM  Result Value Ref Range   Sodium 138 135 - 145 mmol/L   Potassium 3.3 (L) 3.5 - 5.1 mmol/L   Chloride 110 101 - 111 mmol/L   CO2 18 (L) 22 - 32 mmol/L   Glucose, Bld 77 65 - 99 mg/dL   BUN 6 6 - 20 mg/dL   Creatinine, Ser 0.88 0.44 - 1.00 mg/dL   Calcium 8.6 (L) 8.9 - 10.3 mg/dL   GFR calc non Af Amer >60 >60 mL/min   GFR calc Af Amer >60 >60 mL/min    Comment: (NOTE) The eGFR has been calculated using the CKD EPI equation. This calculation has not been validated in all clinical situations. eGFR's persistently <60 mL/min signify possible Chronic Kidney Disease.    Anion gap 10 5 - 15    Comment: Performed at [facility], [place] [address], [place],  [address]  Ammonia     Status: None   Collection Time: 12/26/17  7:11 AM  Result Value Ref Range   Ammonia 30 9 - 35 umol/L    Comment: Performed at [facility], [place] [address], [place], [place] [address]  Lactic acid, plasma     Status: Abnormal   Collection Time: 12/26/17 12:38 PM  Result Value Ref Range   Lactic Acid, Venous 2.3 (HH) 0.5 - 1.9 mmol/L    Comment: CRITICAL RESULT CALLED TO, READ BACK BY AND VERIFIED WITH: HABIB,E.  RN AT 1349 12/26/17 MULLINS,T Performed at [facility], [place] [address] [place], [place] [address]   Procalcitonin - Baseline     Status: None   Collection Time: 12/26/17 12:38 PM  Result Value Ref Range   Procalcitonin 0.36 ng/mL    Comment:        Interpretation: PCT (Procalcitonin) <=  0.5 ng/mL: Systemic infection (sepsis) is not likely. Local bacterial infection is possible. (NOTE)       Sepsis PCT Algorithm           Lower Respiratory Tract                                      Infection PCT Algorithm    ----------------------------     ----------------------------         PCT < 0.25 ng/mL                PCT < 0.10 ng/mL         Strongly encourage             Strongly discourage   discontinuation of antibiotics    initiation of antibiotics    ----------------------------     -----------------------------       PCT 0.25 - 0.50 ng/mL            PCT 0.10 - 0.25 ng/mL               OR       >80% decrease in PCT            Discourage initiation of                                            antibiotics      Encourage discontinuation           of antibiotics    ----------------------------     -----------------------------         PCT >= 0.50 ng/mL              PCT 0.26 - 0.50 ng/mL               AND        <80% decrease in PCT             Encourage initiation of                                             antibiotics       Encourage continuation           of antibiotics    ----------------------------     -----------------------------        PCT >= 0.50 ng/mL                  PCT > 0.50 ng/mL               AND         increase in PCT                  Strongly encourage  initiation of antibiotics    Strongly encourage escalation           of antibiotics                                     -----------------------------                                           PCT <= 0.25 ng/mL                                                 OR                                        > 80% decrease in PCT                                     Discontinue / Do not initiate                                             antibiotics Performed at [facility] [address], [place], [place] [address]   Hepatic function panel      Status: Abnormal   Collection Time: 12/26/17 12:38 PM  Result Value Ref Range   Total Protein 4.7 (L) 6.5 - 8.1 g/dL   Albumin 2.7 (L) 3.5 - 5.0 g/dL   AST 48 (H) 15 - 41 U/L   ALT 25 14 - 54 U/L   Alkaline Phosphatase 107 38 - 126 U/L   Total Bilirubin 4.3 (H) 0.3 - 1.2 mg/dL   Bilirubin, Direct 1.5 (H) 0.1 - 0.5 mg/dL   Indirect Bilirubin 2.8 (H) 0.3 - 0.9 mg/dL    Comment: Performed at [facility], [place] [address] [place], [place] [address]    Current Facility-Administered Medications  Medication Dose Route Frequency Provider Last Rate Last Dose  . albumin human 25 % solution 12.5 g  12.5 g Intravenous Q6H Adhikari Bk, Amrit, MD   12.5 g at 12/27/17 0511  . Chlorhexidine Gluconate Cloth 2 % PADS 6 each  6 each Topical Daily Adhikari Bk, Amrit, MD      . dexmedetomidine (PRECEDEX) 200 MCG/50ML (4 mcg/mL) infusion  0.4-1.4 mcg/kg/hr Intravenous Titrated Brand Males, MD 18.3 mL/hr at 12/27/17 0632 0.8 mcg/kg/hr at 12/27/17 9030  . folic acid (FOLVITE) tablet 1 mg  1 mg Per Tube Daily Erick Colace, NP      . furosemide (LASIX) injection 40 mg  40 mg Intravenous BID Marene Lenz, MD   40 mg at 12/27/17 0903  . furosemide (LASIX) injection 40 mg  40 mg Intravenous Once Erick Colace, NP      . lactulose (CHRONULAC) 10 GM/15ML solution 30 g  30 g Per Tube TID Erick Colace, NP      . lamoTRIgine (LAMICTAL) tablet 200 mg  200  mg Per Tube QHS Erick Colace, NP      . levothyroxine (SYNTHROID, LEVOTHROID) tablet 100 mcg  100 mcg Per Tube QAC breakfast Erick Colace, NP      . ondansetron [facility]) injection 4 mg  4 mg Intravenous Q6H PRN Karmen Bongo, MD      . phenylephrine (NEOSYNEPHRINE) 10-0.9 MG/250ML-% infusion  0-400 mcg/min Intravenous Titrated Erick Colace, NP 45 mL/hr at 12/27/17 0905 30 mcg/min at 12/27/17 0905  . piperacillin-tazobactam (ZOSYN) IVPB 3.375 g  3.375 g Intravenous Q8H Eudelia Bunch, RPH   Stopped at 12/27/17 2774   . potassium chloride 20 MEQ/15ML (10%) solution 40 mEq  40 mEq Per Tube Q4H Erick Colace, NP      . prednisoLONE tablet 40 mg  40 mg Per Tube Daily Erick Colace, NP      . rifaximin Doreene Nest) tablet 550 mg  550 mg Per Tube TID Erick Colace, NP      . sodium chloride flush (NS) 0.9 % injection 10-40 mL  10-40 mL Intracatheter Q12H Marene Lenz, MD   10 mL at 12/26/17 2131  . sodium chloride flush (NS) 0.9 % injection 10-40 mL  10-40 mL Intracatheter PRN Jodie Echevaria, Amrit, MD      . spironolactone (ALDACTONE) tablet 50 mg  50 mg Per Tube Daily Erick Colace, NP      . thiamine 517m in normal saline (514m IVPB  500 mg Intravenous Q24H YaKarmen BongoMD   Stopped at 12/26/17 1935    Musculoskeletal: Strength & Muscle Tone: UTA since patient is sedated and unable to arouse.  Gait & Station: UTA since patient is sedated and unable to arouse.  Patient leans: N/A  Psychiatric Specialty Exam: Physical Exam  Nursing note and vitals reviewed. Constitutional: She appears well-developed and well-nourished.  HENT:  Head: Normocephalic and atraumatic.  Respiratory: Effort normal.  Neurological:  Sedated and unable to arouse.   Skin: No rash noted.  Psychiatric:  UTA since patient is sedated and unable to arouse.    Review of Systems  Unable to perform ROS: Mental status change    Blood pressure (!) 91/51, pulse (!) 51, temperature 98 F (36.7 C), temperature source Oral, resp. rate (!) 30, height 5' 5"  (1.651 m), weight 87.5 kg (192 lb 14.4 oz), SpO2 91 %.Body mass index is 32.1 kg/m.  General Appearance: Fairly Groomed, middle aged, Caucasian female with jaundiced skin, long hair, NG tube placement and lying in bed asleep. NAD.   Eye Contact:  None  Speech:  NA  Volume:  UTA since patient is sedated and unable to arouse.   Mood:  UTA since patient is sedated and unable to arouse.   Affect:  UTA since patient is sedated and unable to arouse.   Thought Process:  NA   Orientation:  Other:  UTA since patient is sedated and unable to arouse.   Thought Content:  UTA since patient is sedated and unable to arouse.   Suicidal Thoughts:  UTA since patient is sedated and unable to arouse.   Homicidal Thoughts:  UTA since patient is sedated and unable to arouse.   Memory:  UTA since patient is sedated and unable to arouse.   Judgement:  Other:  UTA since patient is sedated and unable to arouse.   Insight:  UTA since patient is sedated and unable to arouse.   Psychomotor Activity:  Decreased  Concentration:  Concentration: UTA since patient is sedated and  unable to arouse.  and Attention Span: UTA since patient is sedated and unable to arouse.   Recall:  UTA since patient is sedated and unable to arouse.   Fund of Knowledge:  UTA since patient is sedated and unable to arouse.   Language:  UTA since patient is sedated and unable to arouse.   Akathisia:  NA  Handed:  Right  AIMS (if indicated):   N/A  Assets:  Housing Social Support  ADL's:  Impaired  Cognition: UTA since patient is sedated and unable to arouse.   Sleep:   N/A   Assessment: Amy Powers is a 47 y.o. female who was admitted with acute metabolic encephalopathy in the setting of alcoholic liver disease and probable cirrhosis. She was sedated and unable to arouse for interview. Her mother was at bedside and provided the history. She has a history of bipolar disorder and was relatively stable on her current medication regimen until this past December when she lost her job which caused worsening mood and heavy alcohol use. Recommend restarting Latuda and Lithium. Recommend reducing Lamictal by 50% in setting of liver disease. These medication recommendations were discussed with the pharmacist who calculates the patient to be a Child-Pugh class B.   Treatment Plan Summary: -Restart Latuda 60 mg qhs and Lithium 300 mg BID for mood stabilization. -Decrease Lamictal 200 mg qhs to 100 mg qhs for mood  stabilization. -If patient's mental status worsens then mood recommend further decreasing Latuda to 40 mg daily. If no improvement, then would recommend discontinuing Latuda and increasing Lithium to 450 mg BID for mood stabilization.  -Psychiatry will sign off on patient at this time. Please consult psychiatry again as needed.   Disposition: No evidence of imminent risk to self or others at present.   Patient does not meet criteria for psychiatric inpatient admission.  Faythe Dingwall, DO 12/27/2017 10:23 AM

## 2017-12-27 NOTE — Progress Notes (Signed)
Initial Nutrition Assessment  DOCUMENTATION CODES:   (Will assess for malnutrition at follow-up.)  INTERVENTION:  - Diet advancement as medially feasible/as mentation permits. - RD will monitor medical course and provide interventions/recommendations as warranted.  - Will attempt NFPE at follow-up.  NUTRITION DIAGNOSIS:   Inadequate oral intake related to inability to eat as evidenced by NPO status.  GOAL:   Patient will meet greater than or equal to 90% of their needs  MONITOR:   Diet advancement, Weight trends, Labs, Skin  REASON FOR ASSESSMENT:   Low [place]  ASSESSMENT:   47 year old female lifelong alcoholic and prior hx of bipolar.  Lost her job in December 2018 and then became depressed.  After that mother suspects patient has been drinking.  Patient lives alone and does not have any family members locally.  BMI indicates obesity. Pt has been mainly NPO since admission on 2/21. Pt sleeping at this time; flow sheet indicates that she is a/o to self only. Mom at bedside provides information. Pt had gastric bypass surgery when she was 47 years-old and primarily consumes fruit, veggies, and poultry. Mom states "she has always tried to eat healthy." Mom [place] living in [place] and flying up on 2/6 to be with pt. Unable to obtain details about intakes prior to this admission.  Mom states "I am not sure if she is even going to survive. It looks grim." Did not ask further questions following this statement.   Per PCCM notes this AM, Panda placement unsuccessful so far but plan to re-attempt in order to provide PO lactulose. Mild to moderate acute respiratory failure with risk for worsening. Worsening liver failure with worsening jaundice. Noted hx of alcoholism with last drink in January 2019.   Per chart review, weight -7 lbs/3.2 kg since 2/22. Will continue to monitor weight trends closely.   Medications reviewed; 1 mg folic acid/day, 20 mg IV Lasix x1 dose yesterday, 40 mg IV  Lasix BID, 30 g lactulose TID (once Panda placed), 100 mcg Synthroid/day, 10 mEq IV KCl x4 runs yesterday, 40 mEq KCl x3 doses today (once Panda placed), 50 mg Aldactone/day, 500 mg IV thiamine/day. Labs reviewed; K: 3.3 mmol/L, Ca: 8.6 mg/dL.    NUTRITION - FOCUSED PHYSICAL EXAM:  Unable to complete/assess at this time but will attempt at follow-up.  Diet Order:  Fall precautions  EDUCATION NEEDS:   No education needs have been identified at this time  Skin:  Skin Assessment: Skin Integrity Issues: Skin Integrity Issues:: Stage II Stage II: stage 2 bilateral buttocks  Last BM:  2/25  Height:   Ht Readings from Last 1 Encounters:  12/26/17 5\' 5"  (1.651 m)    Weight:   Wt Readings from Last 1 Encounters:  12/27/17 192 lb 14.4 oz (87.5 kg)    Ideal Body Weight:  56.82 kg  BMI:  Body mass index is 32.1 kg/m.  Estimated Nutritional Needs:   Kcal:  [id number] (25-27 kcal/kg)  Protein:  105-122 grams (1.2-1.4 grams/kg)  Fluid:  >/= 2 L/day      Trenton GammonJessica Brayn Eckstein, MS, RD, LDN, [facility] Inpatient Clinical Dietitian Pager # [phone] After hours/weekend pager # [phone]

## 2017-12-27 NOTE — Assessment & Plan Note (Signed)
Plan Continue Synthroid supplementation

## 2017-12-27 NOTE — Progress Notes (Signed)
   12/27/17 1200  Clinical Encounter Type  Visited With Patient and family together  Visit Type Follow-up  Referral From Chaplain  Consult/Referral To Chaplain  Spiritual Encounters  Spiritual Needs Emotional  Stress Factors  Patient Stress Factors Not reviewed   Followed up from a SCC.  The patient's mother was at bedside and she indicated a Zorita Pangriest had come by last evening and anointed the patient and left a card.  The mother was very appreciative of the support.  Visited with the mother for a while and she recounted all that has gone on with the daughter in terms of the daughters marriage that ended and several jobs where the companies had lay offs.  The patient has a support system of her family, but none clearly identified beyond that.  Will follow and support as needed. Chaplain Agustin CreeNewton Moyses Pavey

## 2017-12-27 NOTE — Progress Notes (Signed)
PULMONARY / CRITICAL CARE MEDICINE   Name: Amy Powers MRN: [id number] DOB: [birth year] PCP Copland, Gwenlyn Found, MD LOS 5 as of 12/27/2017     ADMISSION DATE:  12/06/2017 CONSULTATION DATE:  12/27/2017   REFERRING MD:  [facility] hospitalist  CHIEF COMPLAINT:  Acute encephaloathy  HISTORY OF PRESENT ILLNESS:   History is provided by the mother and review of the chart.  Patient is unable to give history because of acute encephalopathy  47 year old female lifelong alcoholic and prior hx of bipolar.  Lost her job in December 2018 and then became depressed.  After that mother suspects patient has been drinking.  Patient lives alone and does not have any family members locally.  Closest family member his mother in [place] and a sibling in [place] and another sibling in [place].  There are no children or friends [place].  She has been divorced for the last 4 years.  Then approximately December 06, 2017 patient was admitted for ascites, anasarca and hepatic encephalopathy and mild rhabdo (CK 1700) according to the mother.  Discharge around December 16, 2017 and was doing well with improved mobility but using a walker.  But according to the mother patient ran out of lactulose because there was no refill on this from the hospital. ALso given prednisonolne at discharge but mom says pharmacy was out of this so not given.  Patient is also having worsening ascites and anasarca.  Finally seen by primary care physician December 22, 2017 and sent to the emergency room and patient admitted.  Since admission overall mom does not think anasarca has improved.  In addition patient has developed acute encephalopathy with agitated delirium.  This is now been going on for the last 2 days.  It is unchanged to the mother.  Also intermittent tachypnea.  Therefore critical care medicine has been consulted. MELD score 12/27/2017 is 16   Home pysch meds: lithium, latuda, lamictal  Mom is very astute about the consequences of  cirrhosis and has discussed care limitations with [place].   VITAL SIGNS: BP 98/68   Pulse (!) 53   Temp 98 F (36.7 C) (Oral)   Resp (!) 25   Ht 5\' 5"  (1.651 m)   Wt 192 lb 14.4 oz (87.5 kg)   LMP  (LMP Unknown)   SpO2 90%   BMI 32.10 kg/m   HEMODYNAMICS:    VENTILATOR SETTINGS:    INTAKE / OUTPUT: I/O last 3 completed shifts: In: 631.5 [I.V.:281.5; IV Piggyback:350] Out: 3700 [Urine:3700]     EXAM  General: This is a 47 year old female, lying in bed.  She remains quite encephalopathic, nonfocal, screaming out at times. HEENT: Normocephalic atraumatic, some dried blood on nare.  Sclera nonicteric Pulmonary: Scattered rhonchi, slight tachypnea this morning, basilar rales. Cardiac: Regular rate and rhythm without murmur rub or gallop Extremities: Diffuse anasarca, brisk cap refill, scattered areas of ecchymosis. Abdomen: Soft, nontender, no organomegaly.  Hypoactive bowel sounds.  Last BM: Neuro: Currently sedated on Precedex drip, moves all extremities, screams out at times. GU: Clear yellow urine   LABS  PULMONARY No results for input(s): PHART, PCO2ART, PO2ART, HCO3, TCO2, O2SAT in the last 168 hours.  Invalid input(s): PCO2, PO2  CBC Recent Labs  Lab 12/24/17 0449 12/25/17 0509 12/26/17 0711  HGB 8.5* 9.6* 9.3*  HCT 25.8* 30.2* 27.9*  WBC 14.5* 19.8* 20.4*  PLT 193 211 174    COAGULATION Recent Labs  Lab 12/06/2017 1345 12/23/17 0352  INR 1.47 1.51    CARDIAC  Recent Labs  Lab 12/06/2017 1345  TROPONINI <0.03   No results for input(s): PROBNP in the last 168 hours.   CHEMISTRY Recent Labs  Lab 12/10/2017 1345 12/23/17 0352 12/24/17 0449 12/25/17 0509 12/26/17 0711  NA 129* 133* 135 137 138  K 2.9* 3.4* 3.7 3.6 3.3*  CL 102 104 106 108 110  CO2 19* 19* 19* 19* 18*  GLUCOSE 113* 124* 99 94 77  BUN 7 8 8 6 6   CREATININE 0.81 0.84 0.82 0.83 0.88  CALCIUM 7.6* 7.9* 8.6* 8.8* 8.6*  MG 2.0  --  2.1  --   --    Estimated Creatinine  Clearance: 87.3 mL/min (by C-G formula based on SCr of 0.88 mg/dL).   LIVER Recent Labs  Lab 12/07/2017 1345 12/23/17 0352 12/24/17 0449 12/26/17 1238  AST 60* 60* 49* 48*  ALT 31 31 26 25   ALKPHOS 148* 154* 115 107  BILITOT 4.1* 4.5* 3.4* 4.3*  PROT 5.4* 5.2* 5.4* 4.7*  ALBUMIN 2.2* 2.1* 3.2* 2.7*  INR 1.47 1.51  --   --      INFECTIOUS Recent Labs  Lab 12/23/17 0037 12/23/17 0352 12/25/17 0509 12/26/17 1238  LATICACIDVEN 2.8* 2.3* 1.9 2.3*  PROCALCITON 0.28  --   --  0.36     ENDOCRINE CBG (last 3)  No results for input(s): GLUCAP in the last 72 hours.   IMAGING x48h  - image(s) personally visualized  -   highlighted in bold Dg Abd 1 View  Result Date: 12/27/2017 CLINICAL DATA:  NG tube placement EXAM: ABDOMEN - 1 VIEW COMPARISON:  12/26/2017 FINDINGS: NG tube has been retracted with the tip now in the distal esophagus in the lower chest. IMPRESSION: NG tube tip in the distal esophagus in the lower chest. Electronically Signed   By: Charlett Nose M.D.   On: 12/27/2017 01:21   Dg Chest Port 1 View  Result Date: 12/26/2017 CLINICAL DATA:  Alcoholic cirrhosis. Now with shortness of breath and signs of pneumonia. EXAM: PORTABLE CHEST 1 VIEW COMPARISON:  Chest x-ray of December 24, 2017 FINDINGS: Fluffy airspace opacities have developed bilaterally there is no pleural effusion. The heart is top-normal in size. The trachea is midline allowing for patient rotation. IMPRESSION: Fluffy airspace opacities most compatible with bilateral pulmonary interstitial and alveolar edema which may be of cardiac or noncardiac cause. Pneumonia cannot be excluded in the appropriate clinical setting. Electronically Signed   By: David  [place] M.D.   On: 12/26/2017 13:40   Dg Abd Portable 1v  Result Date: 12/26/2017 CLINICAL DATA:  NG tube placement. EXAM: PORTABLE ABDOMEN - 1 VIEW COMPARISON:  None FINDINGS: NG tube tip is at the gastroesophageal junction and needs to be advanced. Bowel gas  pattern is within normal limits. Surgical clips scattered about the abdomen. Bones are normal. IMPRESSION: NG tube tip in the distal esophagus at the gastroesophageal junction. Electronically Signed   By: Francene Boyers M.D.   On: 12/26/2017 17:00    ASSESSMENT and PLAN  Encephalopathy acute Delirum -suspect multifactorial, initially hepatic encephalopathy following several missed doses of her maintenance lactulose, but now concerned about underlying psychiatric history and medications Plan Continue rifaximin mean and lactulose Precedex for RASS goal of 0 Correct metabolic derangements Awaiting psychiatric evaluation, continuing her Lamictal, however she was also on Latuda and lithium specifically would like some direction on these medications from psychiatry  Cirrhosis, alcoholic (HCC) Last drink late Jan 2019,. Dx of early-mid feb admission - alcoholic hepatis and given prednisolone  which sshe could not fill at discharge. 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  Could be an element of pulmonary edema here as well -Procalcitonin not impressive Plan Day #4 of 7 Zosyn Continue procalcitonin monitoring   Goals of care, counseling/discussion D/w mom. Mom aware that long term prognosis from cirrhosis poor and renal dysfunction means evern poorer prognosis Plan Full medical scope of care.  Ventilator Short-term okay No CPR or defibrillation  Hypothyroidism Plan Continue Synthroid supplementation   Schizophrenia (HCC) Plan Awaiting psychiatric evaluation and recommendations      FAMILY  - Updates: 12/27/2017 --> mom at  bedside in 4thr  Floor [place]  - Inter-disciplinary family meet or Palliative Care meeting due by:  DAy 7. Current LOS is LOS 5 days  Simonne MartinetPeter E Gilford Lardizabal ACNP-BC [facility] Pulmonary/Critical Care Pager # [phone] OR # [phone] if no answer

## 2017-12-28 DIAGNOSIS — E46 Unspecified protein-calorie malnutrition: Secondary | ICD-10-CM

## 2017-12-28 DIAGNOSIS — J189 Pneumonia, unspecified organism: Secondary | ICD-10-CM

## 2017-12-28 LAB — BASIC METABOLIC PANEL
Anion gap: 11 (ref 5–15)
BUN: 10 mg/dL (ref 6–20)
CALCIUM: 8 mg/dL — AB (ref 8.9–10.3)
CHLORIDE: 115 mmol/L — AB (ref 101–111)
CO2: 21 mmol/L — ABNORMAL LOW (ref 22–32)
CREATININE: 0.77 mg/dL (ref 0.44–1.00)
Glucose, Bld: 144 mg/dL — ABNORMAL HIGH (ref 65–99)
Potassium: 2.8 mmol/L — ABNORMAL LOW (ref 3.5–5.1)
SODIUM: 147 mmol/L — AB (ref 135–145)

## 2017-12-28 LAB — HEPATIC FUNCTION PANEL
ALBUMIN: 2.5 g/dL — AB (ref 3.5–5.0)
ALT: 19 U/L (ref 14–54)
AST: 36 U/L (ref 15–41)
Alkaline Phosphatase: 94 U/L (ref 38–126)
BILIRUBIN DIRECT: 1.9 mg/dL — AB (ref 0.1–0.5)
BILIRUBIN TOTAL: 4.6 mg/dL — AB (ref 0.3–1.2)
Indirect Bilirubin: 2.7 mg/dL — ABNORMAL HIGH (ref 0.3–0.9)
Total Protein: 4.5 g/dL — ABNORMAL LOW (ref 6.5–8.1)

## 2017-12-28 LAB — PHOSPHORUS
PHOSPHORUS: 3 mg/dL (ref 2.5–4.6)
PHOSPHORUS: 2.5 mg/dL (ref 2.5–4.6)
Phosphorus: 2.4 mg/dL — ABNORMAL LOW (ref 2.5–4.6)

## 2017-12-28 LAB — CBC WITH DIFFERENTIAL/PLATELET
Basophils Absolute: 0 10*3/uL (ref 0.0–0.1)
Basophils Relative: 0 %
Eosinophils Absolute: 0 10*3/uL (ref 0.0–0.7)
Eosinophils Relative: 0 %
HEMATOCRIT: 31.1 % — AB (ref 36.0–46.0)
Hemoglobin: 10.4 g/dL — ABNORMAL LOW (ref 12.0–15.0)
LYMPHS ABS: 1.9 10*3/uL (ref 0.7–4.0)
LYMPHS PCT: 10 %
MCH: 39.2 pg — ABNORMAL HIGH (ref 26.0–34.0)
MCHC: 33.4 g/dL (ref 30.0–36.0)
MCV: 117.4 fL — AB (ref 78.0–100.0)
Monocytes Absolute: 0.6 10*3/uL (ref 0.1–1.0)
Monocytes Relative: 3 %
NEUTROS PCT: 87 %
Neutro Abs: 16.6 10*3/uL — ABNORMAL HIGH (ref 1.7–7.7)
PLATELETS: 174 10*3/uL (ref 150–400)
RBC: 2.65 MIL/uL — AB (ref 3.87–5.11)
RDW: 14.4 % (ref 11.5–15.5)
WBC: 19.1 10*3/uL — AB (ref 4.0–10.5)

## 2017-12-28 LAB — GLUCOSE, CAPILLARY
GLUCOSE-CAPILLARY: 160 mg/dL — AB (ref 65–99)
Glucose-Capillary: 156 mg/dL — ABNORMAL HIGH (ref 65–99)

## 2017-12-28 LAB — PROCALCITONIN: PROCALCITONIN: 0.51 ng/mL

## 2017-12-28 LAB — PROTIME-INR
INR: 2.92
PROTHROMBIN TIME: 30.3 s — AB (ref 11.4–15.2)

## 2017-12-28 LAB — MAGNESIUM
MAGNESIUM: 1.9 mg/dL (ref 1.7–2.4)
MAGNESIUM: 2.2 mg/dL (ref 1.7–2.4)
Magnesium: 1.9 mg/dL (ref 1.7–2.4)

## 2017-12-28 MED ORDER — LACTULOSE 10 GM/15ML PO SOLN: 30.0000 g | ORAL | Status: DC

## 2017-12-28 MED ORDER — POTASSIUM CHLORIDE 10 MEQ/50ML IV SOLN
10.0000 meq | INTRAVENOUS | Status: AC
  Administered 2017-12-28 (×6): 10 meq via INTRAVENOUS
  Filled 2017-12-28 (×6): qty 50

## 2017-12-28 MED ORDER — FREE WATER
200.0000 mL | Status: DC
  Administered 2017-12-28 – 2018-01-02 (×26): 200 mL

## 2017-12-28 MED ORDER — OSMOLITE 1.5 CAL PO LIQD
1000.0000 mL | ORAL | Status: DC
  Administered 2017-12-28: 1000 mL
  Filled 2017-12-28 (×2): qty 1000

## 2017-12-28 MED ORDER — VITAL HIGH PROTEIN PO LIQD
1000.0000 mL | ORAL | Status: DC
  Filled 2017-12-28: qty 1000

## 2017-12-28 MED ORDER — PRO-STAT SUGAR FREE PO LIQD: 30.0000 mL | Freq: Two times a day (BID) | ORAL | Status: DC

## 2017-12-28 MED ORDER — PRO-STAT SUGAR FREE PO LIQD
30.0000 mL | Freq: Every day | ORAL | Status: DC
  Administered 2017-12-29 – 2018-01-01 (×3): 30 mL
  Filled 2017-12-28 (×3): qty 30

## 2017-12-28 MED ORDER — POTASSIUM CHLORIDE 20 MEQ/15ML (10%) PO SOLN
40.0000 meq | Freq: Once | ORAL | Status: AC
  Administered 2017-12-28: 40 meq
  Filled 2017-12-28: qty 30

## 2017-12-28 MED ORDER — DEXMEDETOMIDINE HCL IN NACL 200 MCG/50ML IV SOLN
0.4000 ug/kg/h | INTRAVENOUS | Status: DC
  Administered 2017-12-28: 0.5 ug/kg/h via INTRAVENOUS
  Administered 2017-12-28: 0.8 ug/kg/h via INTRAVENOUS
  Administered 2017-12-28 (×2): 1 ug/kg/h via INTRAVENOUS
  Filled 2017-12-28 (×3): qty 50

## 2017-12-28 MED ORDER — LACTULOSE 10 GM/15ML PO SOLN
30.0000 g | Freq: Four times a day (QID) | ORAL | Status: DC
  Administered 2017-12-28 – 2017-12-29 (×3): 30 g via ORAL
  Filled 2017-12-28 (×3): qty 45

## 2017-12-28 MED ORDER — MAGNESIUM SULFATE 2 GM/50ML IV SOLN
2.0000 g | Freq: Once | INTRAVENOUS | Status: AC
  Administered 2017-12-28: 2 g via INTRAVENOUS
  Filled 2017-12-28: qty 50

## 2017-12-28 MED ORDER — THIAMINE HCL 100 MG/ML IJ SOLN
100.0000 mg | Freq: Every day | INTRAMUSCULAR | Status: DC
  Administered 2017-12-28 – 2018-01-03 (×7): 100 mg via INTRAVENOUS
  Filled 2017-12-28 (×6): qty 2

## 2017-12-28 MED ORDER — MIDAZOLAM HCL 2 MG/2ML IJ SOLN
0.5000 mg | Freq: Once | INTRAMUSCULAR | Status: AC
  Administered 2017-12-28: 0.5 mg via INTRAVENOUS
  Filled 2017-12-28: qty 2

## 2017-12-28 MED ORDER — DEXMEDETOMIDINE HCL IN NACL 400 MCG/100ML IV SOLN
0.4000 ug/kg/h | INTRAVENOUS | Status: DC
  Administered 2017-12-28: 1.4 ug/kg/h via INTRAVENOUS
  Administered 2017-12-28: 1.2 ug/kg/h via INTRAVENOUS
  Administered 2017-12-29 (×2): 1 ug/kg/h via INTRAVENOUS
  Administered 2017-12-29: 1.6 ug/kg/h via INTRAVENOUS
  Administered 2017-12-29 (×2): 1 ug/kg/h via INTRAVENOUS
  Administered 2017-12-30: 0.8 ug/kg/h via INTRAVENOUS
  Administered 2017-12-30: 0.6 ug/kg/h via INTRAVENOUS
  Administered 2017-12-30 – 2017-12-31 (×3): 1 ug/kg/h via INTRAVENOUS
  Administered 2017-12-31 (×3): 1.1 ug/kg/h via INTRAVENOUS
  Administered 2017-12-31: 1.4 ug/kg/h via INTRAVENOUS
  Administered 2017-12-31: 1 ug/kg/h via INTRAVENOUS
  Administered 2018-01-01 (×2): 1.2 ug/kg/h via INTRAVENOUS
  Administered 2018-01-01 (×2): 1.1 ug/kg/h via INTRAVENOUS
  Administered 2018-01-01 (×2): 1.2 ug/kg/h via INTRAVENOUS
  Administered 2018-01-02: 0.7 ug/kg/h via INTRAVENOUS
  Administered 2018-01-02: 1.2 ug/kg/h via INTRAVENOUS
  Administered 2018-01-02: 0.4 ug/kg/h via INTRAVENOUS
  Administered 2018-01-03: 0.7 ug/kg/h via INTRAVENOUS
  Filled 2017-12-28 (×11): qty 100
  Filled 2017-12-28: qty 200
  Filled 2017-12-28 (×17): qty 100

## 2017-12-28 NOTE — Progress Notes (Signed)
STAFF NOTE: I, Dr Lavinia SharpsM Dantrell Schertzer have personally reviewed patient's available data, including medical history, events of note, physical examination and test results as part of my evaluation. I have discussed with resident/NP and other care providers such as pharmacist, RN and RRT.  In addition,  I personally evaluated patient and elicited key findings of   S: on lactulsoe and xifaxn since 2/26. Still no BM. TF being started. Was calm with prcedex but started moaning withiout precedex.  MELD jumped to 24 due to rising bili and signfiicant rise in INR. GI concerned about wilson's disease. Mom disappointed that no portable equiopment to do eye eval for wilson's disease. STil on neo  O: untinubated Jaundiced Moans Improved respiraotry distress  Recent Labs  Lab 12/25/17 0509 12/26/17 0711 12/28/17 0340  HGB 9.6* 9.3* 10.4*  HCT 30.2* 27.9* 31.1*  WBC 19.8* 20.4* 19.1*  PLT 211 174 174    Recent Labs  Lab May 14, 2018 1345 12/23/17 0352 12/24/17 0449 12/25/17 0509 12/26/17 0711 12/27/17 1124 12/28/17 0340  NA 129* 133* 135 137 138  --  147*  K 2.9* 3.4* 3.7 3.6 3.3*  --  2.8*  CL 102 104 106 108 110  --  115*  CO2 19* 19* 19* 19* 18*  --  21*  GLUCOSE 113* 124* 99 94 77  --  144*  BUN 7 8 8 6 6   --  10  CREATININE 0.81 0.84 0.82 0.83 0.88  --  0.77  CALCIUM 7.6* 7.9* 8.6* 8.8* 8.6*  --  8.0*  MG 2.0  --  2.1  --   --  1.6* 1.9  PHOS  --   --   --   --   --  3.2 3.0   Recent Labs  Lab May 14, 2018 1345 12/23/17 0352 12/24/17 0449 12/26/17 1238 12/28/17 0340  AST 60* 60* 49* 48* 36  ALT 31 31 26 25 19   ALKPHOS 148* 154* 115 107 94  BILITOT 4.1* 4.5* 3.4* 4.3* 4.6*  PROT 5.4* 5.2* 5.4* 4.7* 4.5*  ALBUMIN 2.2* 2.1* 3.2* 2.7* 2.5*  INR 1.47 1.51  --   --  2.92    Recent Labs  Lab 12/23/17 0352 12/25/17 0509 12/26/17 1238 12/27/17 1124 12/28/17 0340  LATICACIDVEN 2.3* 1.9 2.3*  --   --   PROCALCITON  --   --  0.36 0.47 0.51     A/P: cirrhosis - workup per GI; Worsening  MELD noted Encephalopathy - increae lactulose , continue xifaxan and precedex Hypotension - continue precedex. Goal sBP > 95 and MAP > 65     .  Rest per NP/medical resident whose note is outlined above and that I agree with  The patient is critically ill with multiple organ systems failure and requires high complexity decision making for assessment and support, frequent evaluation and titration of therapies, application of advanced monitoring technologies and extensive interpretation of multiple databases.   Critical Care Time devoted to patient care services described in this note is  30  Minutes. This time reflects time of care of this signee Dr Kalman ShanMurali Fateh Kindle. This critical care time does not reflect procedure time, or teaching time or supervisory time of PA/NP/Med student/Med Resident etc but could involve care discussion time    Dr. Kalman ShanMurali Langston [place], M.D., [facility] Center[facility].C.C.P Pulmonary and Critical Care Medicine Staff Physician [facility] and Critical [facility] Pager: [phone], If no answer or between  15:00h - 7:00h: call [phone]  12/28/2017 11:23  AM

## 2017-12-28 NOTE — Assessment & Plan Note (Signed)
Hyperchloremic metabolic non-anion gap acidosis; also likely component of bicarbonate loss with stool Plan Free water replacement We will consider bicarbonate replacement orally if acid-base continues to worsen

## 2017-12-28 NOTE — Progress Notes (Signed)
[facility] Progress Note   Chief Complaint:  Encephalopathy / probable cirrhosis / ETOH    SUBJECTIVE:    off precedex. No BMs on lactulose since yesterday. Keeps saying she wants to get up and go home   ASSESSMENT AND PLAN:   1. 47 yo female with recent ETOH. Imaging concerning for underlying cirrhosis, probably ETOH but workup for Wilson's disease is underway. Ceruloplasmin low and urine copper level equivocal.  Her INR rose significantly overnight from 1.51 to 2.92.   -off Precedex, pressors being weaned. Get Lactulose via NGT. BM yesterday, apparently none overnight. She is awake but with slurred speech Keeps repeating phrases, can't engage in conversation yet. Continue lactulose 30 grams TID. Mental status changes likely multifactorial but if not improving will add xifaxan.  I can't adequately evaluate for asterixis. -continue prednisolone 40 mg daily. Bili stable around 4.6.  -Awaiting Opth slit lamp exam. If positive then need to investigate treatment ASAP as this could change her outcome.  -Renal function normal. Good UOP. Since admission her is -3200 fluid balance.    2. Hypokalemia, repletion per primary team. Mg+ normal.  -Aldactone at 50mg . When BP allows should increase dose to at least 100mg  as she is getting 80 mg of lasix a day.   3. Leukocytosis, persistent. WBC ~ 19. On Zosyn. Has PNA. Also prednisolone may be driving some of the white count    OBJECTIVE:      Vital signs in last 24 hours: Temp:  [97.3 F (36.3 C)-98.3 F (36.8 C)] 98.1 F (36.7 C) (02/27 0800) Pulse Rate:  [49-62] 60 (02/27 0845) Resp:  [21-35] 24 (02/27 0845) BP: (87-129)/(52-79) 104/75 (02/27 0845) SpO2:  [93 %-100 %] 94 % (02/27 0845) Weight:  [191 lb 5.8 oz (86.8 kg)] 191 lb 5.8 oz (86.8 kg) (02/27 0308) Last BM Date: 12/26/17 General:   Alert, well-developed,white female in NAD. In soft restraints. Mother at bedside EENT:  Normal hearing, icteric sclera  Heart:   Regular rate and rhythm, massive lower extremity pitting edema / anasarca Pulm: Normal respiratory effort, lungs CTA bilaterally without wheezes or crackles. Abdomen:  Soft, mildly distended ,nontender.  Normal bowel sounds, no masses felt. No hepatomegaly.    Neurologic:  Alert, slurred speech, unable to engage in conversation. Can't assess for asterixis Psych:   cooperative.  Intake/Output from previous day: 02/26 0701 - 02/27 0700 In: 1612.1 [I.V.:1462.1; IV Piggyback:150] Out: 3025 [Urine:3025] Intake/Output this shift: Total I/O In: 753.1 [I.V.:553.1; IV Piggyback:200] Out: 125 [Urine:125]  Lab Results: Recent Labs    12/26/17 0711 12/28/17 0340  WBC 20.4* 19.1*  HGB 9.3* 10.4*  HCT 27.9* 31.1*  PLT 174 174   BMET Recent Labs    12/26/17 0711 12/28/17 0340  NA 138 147*  K 3.3* 2.8*  CL 110 115*  CO2 18* 21*  GLUCOSE 77 144*  BUN 6 10  CREATININE 0.88 0.77  CALCIUM 8.6* 8.0*   LFT Recent Labs    12/28/17 0340  PROT 4.5*  ALBUMIN 2.5*  AST 36  ALT 19  ALKPHOS 94  BILITOT 4.6*  BILIDIR 1.9*  IBILI 2.7*   PT/INR Recent Labs    12/28/17 0340  LABPROT 30.3*  INR 2.92   Principal Problem:   Medication management Active Problems:   Hypotension   Bipolar disease, chronic (HCC)   Encephalopathy acute   Hypothyroidism   HCAP (healthcare-associated pneumonia)   Cirrhosis, alcoholic (HCC)   Pressure injury of skin   Electrolyte and  fluid disorder   Metabolic acidosis   Pulmonary edema   Protein calorie malnutrition (HCC)     LOS: 6 days   Willette Cluster ,NP 12/28/2017, 9:38 AM  Pager number [phone]

## 2017-12-28 NOTE — Progress Notes (Signed)
eLink Physician-Brief Progress Note Patient Name: Amy Powers DOB: 1971-04-20 MRN: [id number]   Date of Service  12/28/2017  HPI/Events of Note  Delirium/Severe Agitation  eICU Interventions  Will order: 1. Versed 0.5 mg IV now.      Intervention Category Major Interventions: Delirium, psychosis, severe agitation - evaluation and management  Waldo Damian Eugene 12/28/2017, 3:24 AM

## 2017-12-28 NOTE — Progress Notes (Signed)
   12/28/17 1600  Clinical Encounter Type  Visited With Patient and family together;Health care provider  Visit Type Follow-up  Spiritual Encounters  Spiritual Needs Emotional  Stress Factors  Family Stress Factors Health changes;Major life changes   Following up from a visit yesterday.  Patient's mom at bedside.  She indicated patient had been agitated today and that seems to be hard for mom to watch.  Mom is hopeful on the one hand but on the other is not sure her daughter will get better.  Stated that during a moment of brief clarity the patient told her mom, I just want to go home.  Scientist, research (physical sciences)Chaplain Stalnaker and I spoke with the mom in the hall allowing her some space to share.  Will follow and support as needed. Chaplain Agustin CreeNewton Inaya Gillham

## 2017-12-28 NOTE — Assessment & Plan Note (Deleted)
Hypokalemia and hypernatremia as well as hyperchloremia and hypophosphatemia  plan Replace and recheck Added free water via D5W at 55 Free water replacement via tube

## 2017-12-28 NOTE — Progress Notes (Signed)
Nutrition Follow-up  DOCUMENTATION CODES:   Obesity unspecified  INTERVENTION:  - Will order 30 mL Prostat once/day with Osmolite 1.5 @ 20 mL/hr to advance by 10 mL every 8 hours to reach goal rate of Osmolite 1.5 @ 60 mL/hr. At goal rate, this regimen will provide 2260 kcal, 105 grams of protein, 2016 mg Na, 2.9 mg copper, and 1097 mL free water. - Continue free water flush per MD/NP order. Current order for 200 mL every 4 hours (1200 mL/day).  Monitor magnesium, potassium, and phosphorus daily for at least 3 days, MD to replete as needed, as pt is at risk for refeeding syndrome given NPO since admission, hx of alcohol abuse with cirrhosis, severe edema to all extremities, current hypokalemia.   NUTRITION DIAGNOSIS:   Inadequate oral intake related to inability to eat as evidenced by NPO status. -ongoing  GOAL:   Patient will meet greater than or equal to 90% of their needs -unmet/unable to meet at this time.   MONITOR:   TF tolerance, Weight trends, Labs, Skin, I & O's  REASON FOR ASSESSMENT:   Consult Enteral/tube feeding initiation and management  ASSESSMENT:   47 year old female lifelong alcoholic and prior hx of bipolar.  Lost her job in December 2018 and then became depressed.  After that mother suspects patient has been drinking.  Patient lives alone and does not have any family members locally.  Weight -1/ lb/0.7 kg compared to yesterday. Estimated needs from yesterday based on yesterday's weight (87.5 kg) and remains appropriate at this time. Mom at bedside. Physical assessment able to be done this AM; pt with eyes closed throughout assessment. Mom feels that arms and face look the same as they have for awhile but that legs look much more swollen than they had even at time of admission (2/21).   GI following and note from this AM states that there is concern for underlying cirrhosis, currently working up for Wilson's disease. RD unable to assess eyes during this visit  but will attempt at follow-up, if possible. Pt continues with acute encephalopathy.   Small bore/Panda tube able to be placed yesterday for medications and TF. Abdominal x-ray from yesterday results indicate that tip of tube is coiled in the stomach. Unable to state malnutrition based on ASPEN guidelines as mom unsure of PO intakes or weight trends recently. Pt is at high risk of malnutrition given underlying diseases, current medical course, severe edema to all extremities.   Medications reviewed; 1 mg folic acid per Panda/day, 40 mg IV Lasix BID, extra 40 mg IV Lasix x1 dose yesterday, 30 g lactulose per Panda TID, 100 mcg Synthroid per Panda/day, 300 mg lithium citrate per Panda BID, 2 g IV Mg sulfate x1 run yesterday, 40 mEq KCl per Panda x1 dose today, 40 mg prednisolone per Panda/day, 50 mg Aldactone/day, 500 mg IV thiamine/day.  Labs reviewed; Na: 147 mmol/L, K: 2.8 mmol/L, Cl: 115 mmol/L, Ca: 8 mg/dL.     NUTRITION - FOCUSED PHYSICAL EXAM:  Completed/assessed; no muscle or fat wasting noted, severe edema to all extremities.  Diet Order:  Fall precautions  EDUCATION NEEDS:   No education needs have been identified at this time  Skin:  Skin Assessment: Skin Integrity Issues: Skin Integrity Issues:: Stage II Stage II: stage 2 bilateral buttocks  Last BM:  2/25  Height:   Ht Readings from Last 1 Encounters:  12/26/17 5\' 5"  (1.651 m)    Weight:   Wt Readings from Last 1 Encounters:  12/28/17  191 lb 5.8 oz (86.8 kg)    Ideal Body Weight:  56.82 kg  BMI:  Body mass index is 31.84 kg/m.  Estimated Nutritional Needs:   Kcal:  [phone] (25-27 kcal/kg)  Protein:  105-122 grams (1.2-1.4 grams/kg)  Fluid:  >/= 2 L/day       Amy GammonJessica Kinser Fellman, MS, RD, LDN, [facility] Inpatient Clinical Dietitian Pager # [phone] After hours/weekend pager # [phone]

## 2017-12-28 NOTE — Assessment & Plan Note (Signed)
Pulmonary edema with diffuse anasarca -Has been getting aggressive diuresis now almost 4 L net negative -No worsening of oxygen status, work of breathing appears improved Plan Continue current scheduled diuretics Continue strict intake and output Repeat x-ray in a.m.

## 2017-12-28 NOTE — Assessment & Plan Note (Addendum)
vomited  Plan Hold tubefeeds Get KUB

## 2017-12-28 NOTE — Assessment & Plan Note (Signed)
Drug-induced Plan Continue Neo-Synephrine as needed via PICC line

## 2017-12-28 NOTE — Assessment & Plan Note (Signed)
Plan Continue pressure relief interventions

## 2017-12-28 NOTE — Progress Notes (Addendum)
PULMONARY / CRITICAL CARE MEDICINE   Name: Amy Powers MRN: [id number] DOB: 06-12-1971 PCP Copland, Gwenlyn Found, MD LOS 6 as of 12/28/2017     ADMISSION DATE:  2018/01/14 CONSULTATION DATE:  12/28/2017   REFERRING MD:  [facility] hospitalist  CHIEF COMPLAINT:  Acute encephaloathy  HISTORY OF PRESENT ILLNESS:   History is provided by the mother and review of the chart.  Patient is unable to give history because of acute encephalopathy  47 year old female lifelong alcoholic and prior hx of bipolar.  Lost her job in December 2018 and then became depressed.  After that mother suspects patient has been drinking.  Patient lives alone and does not have any family members locally.  Closest family member his mother in [place] and a sibling in [place] and another sibling in [place].  There are no children or friends [place].  She has been divorced for the last 4 years.  Then approximately December 06, 2017 patient was admitted for ascites, anasarca and hepatic encephalopathy and mild rhabdo (CK 1700) according to the mother.  Discharge around December 16, 2017 and was doing well with improved mobility but using a walker.  But according to the mother patient ran out of lactulose because there was no refill on this from the hospital. ALso given prednisonolne at discharge but mom says pharmacy was out of this so not given.  Patient is also having worsening ascites and anasarca.  Finally seen by primary care physician January 14, 2018 and sent to the emergency room and patient admitted.  Since admission overall mom does not think anasarca has improved.  In addition patient has developed acute encephalopathy with agitated delirium.  This is now been going on for the last 2 days.  It is unchanged to the mother.  Also intermittent tachypnea.  Therefore critical care medicine has been consulted. MELD score 12/28/2017 is 16   Home pysch meds: lithium, latuda, lamictal  Mom is very astute about the consequences of  cirrhosis and has discussed care limitations with [place].  Subjective Administered Versed last night by p.m. staff  VITAL SIGNS: BP 104/75   Pulse 60   Temp 98.1 F (36.7 C) (Axillary)   Resp (!) 24   Ht 5\' 5"  (1.651 m)   Wt 191 lb 5.8 oz (86.8 kg)   LMP  (LMP Unknown)   SpO2 94%   BMI 31.84 kg/m   HEMODYNAMICS:    VENTILATOR SETTINGS:    INTAKE / OUTPUT: I/O last 3 completed shifts: In: 1732.8 [I.V.:1582.8; IV Piggyback:150] Out: 4800 [Urine:4800]     EXAM  General: 47 year old white female chronically ill-appearing.  She remains quite encephalopathic HEENT: Small bore nasogastric tube in place via left nare.  Mucous membranes are dry, sclera nonicteric currently.  No JVD. Pulmonary: No accessory use, work of breathing improved when compared to 2 / 26.  Some basilar rales still Cardiac: Regular rate and rhythm without murmur rub or gallop Abdomen: Soft nontender no organomegaly Extremities: Warm, dry, diffuse anasarca. Neuro/psych.  Remains quite encephalopathic.  Screaming out at times, trying to pull at things, not able to redirect.  LABS  PULMONARY No results for input(s): PHART, PCO2ART, PO2ART, HCO3, TCO2, O2SAT in the last 168 hours.  Invalid input(s): PCO2, PO2  CBC Recent Labs  Lab 12/25/17 0509 12/26/17 0711 12/28/17 0340  HGB 9.6* 9.3* 10.4*  HCT 30.2* 27.9* 31.1*  WBC 19.8* 20.4* 19.1*  PLT 211 174 174    COAGULATION Recent Labs  Lab 2018-01-14 1345 12/23/17 0352 12/28/17  0340  INR 1.47 1.51 2.92    CARDIAC   Recent Labs  Lab 12/20/2017 1345  TROPONINI <0.03   No results for input(s): PROBNP in the last 168 hours.   CHEMISTRY Recent Labs  Lab 12/11/2017 1345 12/23/17 0352 12/24/17 0449 12/25/17 0509 12/26/17 0711 12/27/17 1124 12/28/17 0340  NA 129* 133* 135 137 138  --  147*  K 2.9* 3.4* 3.7 3.6 3.3*  --  2.8*  CL 102 104 106 108 110  --  115*  CO2 19* 19* 19* 19* 18*  --  21*  GLUCOSE 113* 124* 99 94 77  --  144*  BUN  7 8 8 6 6   --  10  CREATININE 0.81 0.84 0.82 0.83 0.88  --  0.77  CALCIUM 7.6* 7.9* 8.6* 8.8* 8.6*  --  8.0*  MG 2.0  --  2.1  --   --  1.6* 1.9  PHOS  --   --   --   --   --  3.2 3.0   Estimated Creatinine Clearance: 95.6 mL/min (by C-G formula based on SCr of 0.77 mg/dL).   LIVER Recent Labs  Lab 12/15/2017 1345 12/23/17 0352 12/24/17 0449 12/26/17 1238 12/28/17 0340  AST 60* 60* 49* 48* 36  ALT 31 31 26 25 19   ALKPHOS 148* 154* 115 107 94  BILITOT 4.1* 4.5* 3.4* 4.3* 4.6*  PROT 5.4* 5.2* 5.4* 4.7* 4.5*  ALBUMIN 2.2* 2.1* 3.2* 2.7* 2.5*  INR 1.47 1.51  --   --  2.92     INFECTIOUS Recent Labs  Lab 12/23/17 0352 12/25/17 0509 12/26/17 1238 12/27/17 1124 12/28/17 0340  LATICACIDVEN 2.3* 1.9 2.3*  --   --   PROCALCITON  --   --  0.36 0.47 0.51     ENDOCRINE CBG (last 3)  Recent Labs    12/27/17 2020  GLUCAP 154*     IMAGING x48h  - image(s) personally visualized  -   highlighted in bold Dg Abd 1 View  Result Date: 12/27/2017 CLINICAL DATA:  NG tube placement EXAM: ABDOMEN - 1 VIEW COMPARISON:  12/26/2017 FINDINGS: NG tube has been retracted with the tip now in the distal esophagus in the lower chest. IMPRESSION: NG tube tip in the distal esophagus in the lower chest. Electronically Signed   By: Charlett NoseKevin  Dover M.D.   On: 12/27/2017 01:21   Dg Chest Port 1 View  Result Date: 12/27/2017 CLINICAL DATA:  Pulmonary edema. EXAM: PORTABLE CHEST 1 VIEW COMPARISON:  12/26/2017. FINDINGS: Heart size normal. Diffuse bilateral pulmonary infiltrates/edema again noted without interim change. No pleural effusion or pneumothorax. IMPRESSION: Diffuse bilateral pulmonary infiltrates/edema again noted without interim change. Electronically Signed   By: Maisie Fushomas  Register   On: 12/27/2017 10:34   Dg Chest Port 1 View  Result Date: 12/26/2017 CLINICAL DATA:  Alcoholic cirrhosis. Now with shortness of breath and signs of pneumonia. EXAM: PORTABLE CHEST 1 VIEW COMPARISON:  Chest x-ray of  December 24, 2017 FINDINGS: Fluffy airspace opacities have developed bilaterally there is no pleural effusion. The heart is top-normal in size. The trachea is midline allowing for patient rotation. IMPRESSION: Fluffy airspace opacities most compatible with bilateral pulmonary interstitial and alveolar edema which may be of cardiac or noncardiac cause. Pneumonia cannot be excluded in the appropriate clinical setting. Electronically Signed   By: David  [place] M.D.   On: 12/26/2017 13:40   Dg Abd Portable 1v  Result Date: 12/27/2017 CLINICAL DATA:  Status post feeding tube placement. EXAM:  PORTABLE ABDOMEN - 1 VIEW COMPARISON:  Portable chest x-ray of earlier today. FINDINGS: The esophagogastric tube has been withdrawn. The feeding tube is coiled in the gastric body. There are loops of mildly distended small bowel in the left aspect of the abdomen. There is a moderate amount of gas within normal caliber colon in the right lower quadrant. No free extraluminal gas collections are observed. IMPRESSION: The feeding tube tip at approximately 10 cc of tubing are coiled within the gastric body. Electronically Signed   By: David  [place] M.D.   On: 12/27/2017 12:37   Dg Abd Portable 1v  Result Date: 12/26/2017 CLINICAL DATA:  NG tube placement. EXAM: PORTABLE ABDOMEN - 1 VIEW COMPARISON:  None FINDINGS: NG tube tip is at the gastroesophageal junction and needs to be advanced. Bowel gas pattern is within normal limits. Surgical clips scattered about the abdomen. Bones are normal. IMPRESSION: NG tube tip in the distal esophagus at the gastroesophageal junction. Electronically Signed   By: Francene Boyers M.D.   On: 12/26/2017 17:00    ASSESSMENT and PLAN  Encephalopathy acute Delirum -suspect multifactorial, initially hepatic encephalopathy following several missed doses of her maintenance lactulose, but now concerned about underlying psychiatric history and medications.  Has been on Precedex for approximately 48  hours now.  Gastroenterology questioned possibility of Wilson's disease.  I discussed this case with ophthalmology, unfortunately they did not think we could get good enough quality slit lamp exam to be diagnostic  Plan Continue rifaximin mean and lactulose Precedex for RASS goal of 0, will discontinue Precedex today  correct metabolic derangements See above in regards to her bipolar regimen  Cirrhosis, alcoholic (HCC) Last drink late Jan 2019,. Dx of early-mid feb admission - alcoholic hepatis and given prednisolone which sshe could not fill at discharge. 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  Could be an element of pulmonary edema here as well -Procalcitonin not impressive -White blood cell count trending down Plan Day #5 of 7 Zosyn Continue procalcitonin monitoring   Medication management D/w mom. Mom aware that long term prognosis from cirrhosis poor and renal dysfunction means evern poorer prognosis Plan Full medical scope of care.  Ventilator Short-term okay No CPR or defibrillation  Hypothyroidism Plan Continue Synthroid supplementation   Bipolar disease, chronic (HCC) She is been seen by psychiatry, very much appreciate their recommendations. Plan We will restart Latuda at 60 mg nightly and lithium 300 mg twice daily Will reduce Lamictal to 100 mg every at bedtime If mental status were to worsen would Latuda to 40 mg If no improvement we can discontinue Latuda and increase lithium to 450 twice daily     Electrolyte and fluid disorder Hypokalemia and hypernatremia as well as hyperchloremia plan Replace  and recheck Free water replacement via tube  Metabolic acidosis Hyperchloremic metabolic non-anion gap acidosis; also likely component of bicarbonate loss with stool Plan Free water replacement We will consider bicarbonate replacement orally if acid-base continues to worsen  Pulmonary edema Pulmonary edema with diffuse anasarca -Has been getting aggressive diuresis now almost 4 L net negative -No worsening of oxygen status, work of breathing appears improved Plan Continue current scheduled diuretics Continue strict intake and output Repeat  x-ray in a.m.   Pressure injury of skin Plan Continue pressure relief interventions  Hypotension Drug-induced Plan Continue Neo-Synephrine as needed via PICC line  Protein calorie malnutrition (HCC) Plan Start tubefeeds      FAMILY  - Updates: 12/28/2017 --> mom at bedside in 4thr  Floor [place]  - Inter-disciplinary family meet or Palliative Care meeting due by:  DAy 7. Current LOS is LOS 6 days  My cct 43 min  Simonne Martinet ACNP-BC [facility] Pulmonary/Critical Care Pager # [phone] OR # [phone] if no answer

## 2017-12-28 NOTE — Progress Notes (Signed)
eLink Physician-Brief Progress Note Patient Name: Bebe Literaige Tsan DOB: [birth year] MRN: [id number]   Date of Service  12/28/2017  HPI/Events of Note  K+ = 2.8 and Creatinine = 0.77.  eICU Interventions  Will replace K+.      Intervention Category Major Interventions: Electrolyte abnormality - evaluation and management  Sommer,Steven Eugene 12/28/2017, 5:06 AM

## 2017-12-29 DIAGNOSIS — K7201 Acute and subacute hepatic failure with coma: Secondary | ICD-10-CM

## 2017-12-29 DIAGNOSIS — R579 Shock, unspecified: Secondary | ICD-10-CM

## 2017-12-29 LAB — MAGNESIUM
MAGNESIUM: 1.8 mg/dL (ref 1.7–2.4)
Magnesium: 1.9 mg/dL (ref 1.7–2.4)

## 2017-12-29 LAB — BASIC METABOLIC PANEL
ANION GAP: 8 (ref 5–15)
Anion gap: 8 (ref 5–15)
BUN: 11 mg/dL (ref 6–20)
BUN: 11 mg/dL (ref 6–20)
CALCIUM: 8.6 mg/dL — AB (ref 8.9–10.3)
CALCIUM: 8.2 mg/dL — AB (ref 8.9–10.3)
CO2: 26 mmol/L (ref 22–32)
CO2: 26 mmol/L (ref 22–32)
CREATININE: 0.61 mg/dL (ref 0.44–1.00)
Chloride: 115 mmol/L — ABNORMAL HIGH (ref 101–111)
Chloride: 116 mmol/L — ABNORMAL HIGH (ref 101–111)
Creatinine, Ser: 0.73 mg/dL (ref 0.44–1.00)
GFR calc Af Amer: >60 mL/min (ref >60)
GFR calc non Af Amer: >60 mL/min (ref >60)
GFR calc non Af Amer: >60 mL/min (ref >60)
GLUCOSE: 161 mg/dL — AB (ref 65–99)
Glucose, Bld: 162 mg/dL — ABNORMAL HIGH (ref 65–99)
Potassium: 2.3 mmol/L — CL (ref 3.5–5.1)
Potassium: 3.1 mmol/L — ABNORMAL LOW (ref 3.5–5.1)
SODIUM: 150 mmol/L — AB (ref 135–145)
Sodium: 149 mmol/L — ABNORMAL HIGH (ref 135–145)

## 2017-12-29 LAB — RETICULOCYTES
RBC.: 2.79 MIL/uL — ABNORMAL LOW (ref 3.87–5.11)
RETIC CT PCT: 3.4 % — AB (ref 0.4–3.1)
Retic Count, Absolute: 94.9 10*3/uL (ref 19.0–186.0)

## 2017-12-29 LAB — CBC WITH DIFFERENTIAL/PLATELET
BASOS PCT: 0 %
Basophils Absolute: 0 10*3/uL (ref 0.0–0.1)
EOS ABS: 0.4 10*3/uL (ref 0.0–0.7)
Eosinophils Relative: 2 %
HCT: 33.4 % — ABNORMAL LOW (ref 36.0–46.0)
HEMOGLOBIN: 11 g/dL — AB (ref 12.0–15.0)
LYMPHS PCT: 16 %
Lymphs Abs: 2.8 10*3/uL (ref 0.7–4.0)
MCH: 38.9 pg — AB (ref 26.0–34.0)
MCHC: 32.9 g/dL (ref 30.0–36.0)
MCV: 118 fL — AB (ref 78.0–100.0)
MONOS PCT: 6 %
Monocytes Absolute: 1.1 10*3/uL — ABNORMAL HIGH (ref 0.1–1.0)
NEUTROS ABS: 13.3 10*3/uL — AB (ref 1.7–7.7)
Neutrophils Relative %: 76 %
Platelets: 188 10*3/uL (ref 150–400)
RBC: 2.83 MIL/uL — ABNORMAL LOW (ref 3.87–5.11)
RDW: 14.4 % (ref 11.5–15.5)
WBC: 17.6 10*3/uL — ABNORMAL HIGH (ref 4.0–10.5)

## 2017-12-29 LAB — PHOSPHORUS
PHOSPHORUS: 2.6 mg/dL (ref 2.5–4.6)
Phosphorus: 1.8 mg/dL — ABNORMAL LOW (ref 2.5–4.6)

## 2017-12-29 LAB — GLUCOSE, CAPILLARY
GLUCOSE-CAPILLARY: 149 mg/dL — AB (ref 65–99)
GLUCOSE-CAPILLARY: 131 mg/dL — AB (ref 65–99)
GLUCOSE-CAPILLARY: 122 mg/dL — AB (ref 65–99)
GLUCOSE-CAPILLARY: 125 mg/dL — AB (ref 65–99)
Glucose-Capillary: 149 mg/dL — ABNORMAL HIGH (ref 65–99)
Glucose-Capillary: 150 mg/dL — ABNORMAL HIGH (ref 65–99)

## 2017-12-29 LAB — DIRECT ANTIGLOBULIN TEST (NOT AT ARMC)
DAT, IGG: NEGATIVE
DAT, complement: NEGATIVE

## 2017-12-29 LAB — PROTIME-INR
INR: 2.5
PROTHROMBIN TIME: 26.8 s — AB (ref 11.4–15.2)

## 2017-12-29 LAB — URIC ACID: Uric Acid, Serum: 3.5 mg/dL (ref 2.3–6.6)

## 2017-12-29 LAB — LACTATE DEHYDROGENASE: LDH: 230 U/L — AB (ref 98–192)

## 2017-12-29 MED ORDER — LACTULOSE 10 GM/15ML PO SOLN: 30.0000 g | ORAL | Status: DC

## 2017-12-29 MED ORDER — MAGNESIUM SULFATE 2 GM/50ML IV SOLN
2.0000 g | Freq: Once | INTRAVENOUS | Status: AC
  Administered 2017-12-29: 2 g via INTRAVENOUS
  Filled 2017-12-29: qty 50

## 2017-12-29 MED ORDER — SPIRONOLACTONE 25 MG PO TABS
50.0000 mg | ORAL_TABLET | Freq: Every day | ORAL | Status: AC
  Administered 2017-12-29: 50 mg via ORAL
  Filled 2017-12-29: qty 2

## 2017-12-29 MED ORDER — POTASSIUM CHLORIDE 20 MEQ/15ML (10%) PO SOLN
40.0000 meq | ORAL | Status: AC
  Administered 2017-12-30 (×2): 40 meq
  Filled 2017-12-29 (×2): qty 30

## 2017-12-29 MED ORDER — POTASSIUM CHLORIDE 20 MEQ/15ML (10%) PO SOLN
40.0000 meq | ORAL | Status: AC
  Administered 2017-12-29 (×3): 40 meq
  Filled 2017-12-29 (×3): qty 30

## 2017-12-29 MED ORDER — VITAMIN K1 10 MG/ML IJ SOLN
10.0000 mg | Freq: Every day | INTRAMUSCULAR | Status: AC
  Administered 2017-12-29 – 2017-12-31 (×3): 10 mg via SUBCUTANEOUS
  Filled 2017-12-29 (×3): qty 1

## 2017-12-29 MED ORDER — SPIRONOLACTONE 25 MG PO TABS
100.0000 mg | ORAL_TABLET | Freq: Every day | ORAL | Status: DC
  Administered 2017-12-30 – 2018-01-01 (×3): 100 mg
  Filled 2017-12-29 (×3): qty 4

## 2017-12-29 MED ORDER — OSMOLITE 1.5 CAL PO LIQD
1000.0000 mL | ORAL | Status: DC
  Filled 2017-12-29 (×2): qty 1000

## 2017-12-29 MED ORDER — LACTULOSE 10 GM/15ML PO SOLN
30.0000 g | Freq: Three times a day (TID) | ORAL | Status: DC
  Administered 2017-12-29 (×3): 30 g via ORAL
  Filled 2017-12-29 (×4): qty 45

## 2017-12-29 MED ORDER — POTASSIUM PHOSPHATES 15 MMOLE/5ML IV SOLN
30.0000 mmol | Freq: Once | INTRAVENOUS | Status: AC
  Administered 2017-12-29: 30 mmol via INTRAVENOUS
  Filled 2017-12-29: qty 10

## 2017-12-29 MED ORDER — PREDNISOLONE SODIUM PHOSPHATE 15 MG/5ML PO SOLN
40.0000 mg | Freq: Every day | ORAL | Status: DC
  Filled 2017-12-29: qty 15

## 2017-12-29 MED ORDER — RIFAXIMIN 200 MG PO TABS
400.0000 mg | ORAL_TABLET | Freq: Three times a day (TID) | ORAL | Status: DC
  Administered 2017-12-29 – 2018-01-01 (×11): 400 mg
  Filled 2017-12-29 (×17): qty 2

## 2017-12-29 MED ORDER — SODIUM CHLORIDE 0.9 % IV SOLN
0.0000 ug/min | INTRAVENOUS | Status: DC
  Administered 2017-12-29: 50 ug/min via INTRAVENOUS
  Administered 2017-12-30: 100 ug/min via INTRAVENOUS
  Administered 2017-12-31: 85 ug/min via INTRAVENOUS
  Administered 2017-12-31: 72 ug/min via INTRAVENOUS
  Administered 2018-01-01 (×2): 100 ug/min via INTRAVENOUS
  Administered 2018-01-01: 90 ug/min via INTRAVENOUS
  Administered 2018-01-02: 120 ug/min via INTRAVENOUS
  Administered 2018-01-02: 200 ug/min via INTRAVENOUS
  Administered 2018-01-02: 100 ug/min via INTRAVENOUS
  Administered 2018-01-02: 200 ug/min via INTRAVENOUS
  Administered 2018-01-02: 180 ug/min via INTRAVENOUS
  Administered 2018-01-03 (×4): 200 ug/min via INTRAVENOUS
  Filled 2017-12-29: qty 4
  Filled 2017-12-29 (×2): qty 40
  Filled 2017-12-29: qty 4
  Filled 2017-12-29 (×2): qty 40
  Filled 2017-12-29 (×2): qty 4
  Filled 2017-12-29: qty 40
  Filled 2017-12-29 (×4): qty 4
  Filled 2017-12-29 (×6): qty 40

## 2017-12-29 MED ORDER — DEXTROSE 5 % IV SOLN
INTRAVENOUS | Status: DC
Start: 2017-12-29 — End: 2018-01-04
  Administered 2017-12-29 – 2018-01-02 (×3): via INTRAVENOUS
  Administered 2018-01-02: 65 mL via INTRAVENOUS
  Administered 2018-01-03: 06:00:00 via INTRAVENOUS

## 2017-12-29 NOTE — Progress Notes (Addendum)
Referring Physician(s): Pyrtle,J  Supervising Physician: Irish LackYamagata, Glenn  [facility]  [facility]  Chief Complaint: Encephalopathy, alcoholic liver disease   Subjective: Pt familiar to IR service from recent paracentesis on 12/23/17 yielding 2.7 L fluid-SBP negative.  She has a known history of alcoholic hepatitis with ascites and encephalopathy.  Past medical history also significant for bipolar disorder/depression, hypothyroidism, prior cholecystectomy and gastric bypass in 1993.  Recent laboratory evaluation has revealed slightly diminished ceruloplasmin levels and slightly elevated 24-hour urine copper.  Request now received from GI service for random core liver biopsy to rule out Wilson's disease.  Currently in ICU on Neo-Synephrine and Precedex drips with systolic pressure in the 90s.  Mother at bedside.  Patient lethargic and moaning.  Having loose bowel movements-on lactulose/xifaxan. Past Medical History:  Diagnosis Date  . Alcohol abuse   . Depression   . Hypothyroidism   . Liver failure (HCC)   . Migraines   . Schizophrenia [facility](HCC)    Past Surgical History:  Procedure Laterality Date  . CHOLECYSTECTOMY  1993  . GASTRIC BYPASS  1993  . IR PARACENTESIS  12/23/2017      Allergies: Patient has no known allergies.  Medications: Prior to Admission medications   Medication Sig Start Date End Date Taking? Authorizing Provider  folic acid (FOLVITE) 1 MG tablet Take 1 tablet (1 mg total) by mouth daily. 12/17/17  Yes Rolly SalterPatel, Pranav M, MD  furosemide (LASIX) 20 MG tablet Take 1 tablet (20 mg total) by mouth daily. 12/17/17  Yes Rolly SalterPatel, Pranav M, MD  lamoTRIgine (LAMICTAL) 200 MG tablet Take 200 mg by mouth at bedtime. 10/05/17  Yes [provider]  LATUDA 60 MG TABS Take 60 mg by mouth at bedtime. 10/05/17  Yes [provider]  lithium carbonate (LITHOBID) 300 MG CR tablet Take 300 mg by mouth 2 (two) times daily. 10/05/17  Yes [provider]    spironolactone (ALDACTONE) 50 MG tablet Take 1 tablet (50 mg total) by mouth daily. 12/17/17  Yes Rolly SalterPatel, Pranav M, MD  SYNTHROID 100 MCG tablet Take 100 mcg by mouth daily. 09/13/17  Yes [provider]  thiamine 100 MG tablet Take 1 tablet (100 mg total) by mouth daily. 12/17/17  Yes Rolly SalterPatel, Pranav M, MD     Vital Signs: BP 92/67   Pulse 68   Temp (!) 97.2 F (36.2 C) (Axillary)   Resp (!) 30   Ht 5\' 5"  (1.651 m)   Wt 191 lb 5.8 oz (86.8 kg)   LMP  (LMP Unknown)   SpO2 98%   BMI 31.84 kg/m   Physical Exam lethargic, moaning, scleral icterus noted.  Chest with slightly diminished breath sounds at bases, heart with reg rate and rhythm; abdomen sl distended, few bowel sounds; NG in place Imaging: Dg Abd 1 View  Result Date: 12/27/2017 CLINICAL DATA:  NG tube placement EXAM: ABDOMEN - 1 VIEW COMPARISON:  12/26/2017 FINDINGS: NG tube has been retracted with the tip now in the distal esophagus in the lower chest. IMPRESSION: NG tube tip in the distal esophagus in the lower chest. Electronically Signed   By: Charlett NoseKevin  Dover M.D.   On: 12/27/2017 01:21   Dg Chest Port 1 View  Result Date: 12/27/2017 CLINICAL DATA:  Pulmonary edema. EXAM: PORTABLE CHEST 1 VIEW COMPARISON:  12/26/2017. FINDINGS: Heart size normal. Diffuse bilateral pulmonary infiltrates/edema again noted without interim change. No pleural effusion or pneumothorax. IMPRESSION: Diffuse bilateral pulmonary infiltrates/edema again noted without interim change. Electronically  Signed   By: Maisie Fus  Register   On: 12/27/2017 10:34   Dg Chest Port 1 View  Result Date: 12/26/2017 CLINICAL DATA:  Alcoholic cirrhosis. Now with shortness of breath and signs of pneumonia. EXAM: PORTABLE CHEST 1 VIEW COMPARISON:  Chest x-ray of December 24, 2017 FINDINGS: Fluffy airspace opacities have developed bilaterally there is no pleural effusion. The heart is top-normal in size. The trachea is midline allowing for patient rotation. IMPRESSION:  Fluffy airspace opacities most compatible with bilateral pulmonary interstitial and alveolar edema which may be of cardiac or noncardiac cause. Pneumonia cannot be excluded in the appropriate clinical setting. Electronically Signed   By: David  [place] M.D.   On: 12/26/2017 13:40   Dg Abd Portable 1v  Result Date: 12/27/2017 CLINICAL DATA:  Status post feeding tube placement. EXAM: PORTABLE ABDOMEN - 1 VIEW COMPARISON:  Portable chest x-ray of earlier today. FINDINGS: The esophagogastric tube has been withdrawn. The feeding tube is coiled in the gastric body. There are loops of mildly distended small bowel in the left aspect of the abdomen. There is a moderate amount of gas within normal caliber colon in the right lower quadrant. No free extraluminal gas collections are observed. IMPRESSION: The feeding tube tip at approximately 10 cc of tubing are coiled within the gastric body. Electronically Signed   By: David  [place] M.D.   On: 12/27/2017 12:37   Dg Abd Portable 1v  Result Date: 12/26/2017 CLINICAL DATA:  NG tube placement. EXAM: PORTABLE ABDOMEN - 1 VIEW COMPARISON:  None FINDINGS: NG tube tip is at the gastroesophageal junction and needs to be advanced. Bowel gas pattern is within normal limits. Surgical clips scattered about the abdomen. Bones are normal. IMPRESSION: NG tube tip in the distal esophagus at the gastroesophageal junction. Electronically Signed   By: Francene Boyers M.D.   On: 12/26/2017 17:00    Labs:  CBC: Recent Labs    12/25/17 0509 12/26/17 0711 12/28/17 0340 12/29/17 0455  WBC 19.8* 20.4* 19.1* 17.6*  HGB 9.6* 9.3* 10.4* 11.0*  HCT 30.2* 27.9* 31.1* 33.4*  PLT 211 174 174 188    COAGS: Recent Labs    December 29, 2017 1345 12/23/17 0352 12/28/17 0340 12/29/17 1044  INR 1.47 1.51 2.92 2.50    BMP: Recent Labs    12/25/17 0509 12/26/17 0711 12/28/17 0340 12/29/17 0455  NA 137 138 147* 149*  K 3.6 3.3* 2.8* 2.3*  CL 108 110 115* 115*  CO2 19* 18* 21* 26    GLUCOSE 94 77 144* 161*  BUN 6 6 10 11   CALCIUM 8.8* 8.6* 8.0* 8.6*  CREATININE 0.83 0.88 0.77 0.73  GFRNONAA >60 >60 >60 >60  GFRAA >60 >60 >60 >60    LIVER FUNCTION TESTS: Recent Labs    12/23/17 0352 12/24/17 0449 12/26/17 1238 12/28/17 0340  BILITOT 4.5* 3.4* 4.3* 4.6*  AST 60* 49* 48* 36  ALT 31 26 25 19   ALKPHOS 154* 115 107 94  PROT 5.2* 5.4* 4.7* 4.5*  ALBUMIN 2.1* 3.2* 2.7* 2.5*    Assessment and Plan:  Pt with known history of alcoholic hepatitis with ascites and encephalopathy.  Past medical history also significant for bipolar disorder/depression, hypothyroidism, prior cholecystectomy and gastric bypass in 1993.  Recent laboratory evaluation has revealed slightly diminished ceruloplasmin levels and slightly elevated 24-hour urine copper.  Request now received from GI service for random core liver biopsy to rule out Wilson's disease.  Case has been reviewed by Dr. Grace Isaac.  Patient on Precedex and  Neo-Synephrine drips with SBP's in the 90s.  Blood cultures negative to date, ascitic fluid cx neg. Current labs include WBC 17.6, hemoglobin 11, platelets 188k, creatinine 0.73, PT 26.8, INR 2.5, potassium 2.3; potassium being replaced, patient receiving vitamin K; with noted history of ascites and coagulopathy tentative plan at this time is to proceed with transjugular liver biopsy on 3/1 if patient stable.Risks and benefits discussed with the patient's mother including, but not limited to bleeding, infection, damage to adjacent structures or low yield requiring additional tests.  All of the patient's mother's questions were answered, she is agreeable to proceed. Consent signed and in chart.  Ordering service requests two core biopsies performed and placed in formalin, 1 each for quantitative copper and routine pathology   Electronically Signed: D. Jeananne Rama, PA-C 12/29/2017, 4:19 PM   I spent a total of 30 minutes  at the the patient's bedside AND on the patient's  hospital floor or unit, greater than 50% of which was counseling/coordinating care for transjugular random liver biopsy    Patient ID: Amy Powers, female   DOB: 1970/12/14, 47 y.o.   MRN: [id number]

## 2017-12-29 NOTE — Progress Notes (Addendum)
PULMONARY / CRITICAL CARE MEDICINE   Name: Amy Powers MRN: [id number] DOB: 07-Nov-1970 PCP Copland, Gwenlyn Found, MD LOS 7 as of 12/29/2017     ADMISSION DATE:  2018-01-10 CONSULTATION DATE:  12/29/2017   REFERRING MD:  [facility] hospital[facility]  CHIEF COMPLAINT:  Acute encephaloathy  HISTORY OF PRESENT ILLNESS:   History is provided by the mother and review of the chart.  Patient is unable to give history because of acute encephalopathy  47 year old female lifelong alcoholic and prior hx of bipolar.  Lost her job in December 2018 and then became depressed.  After that mother suspects patient has been drinking.  Patient lives alone and does not have any family members locally.  Closest family member his mother in [place] and a sibling in [place] and another sibling in [place].  There are no children or friends [place].  She has been divorced for the last 4 years.  Then approximately December 06, 2017 patient was admitted for ascites, anasarca and hepatic encephalopathy and mild rhabdo (CK 1700) according to the mother.  Discharge around December 16, 2017 and was doing well with improved mobility but using a walker.  But according to the mother patient ran out of lactulose because there was no refill on this from the hospital. ALso given prednisonolne at discharge but mom says pharmacy was out of this so not given.  Patient is also having worsening ascites and anasarca.  Finally seen by primary care physician 01/10/2018 and sent to the emergency room and patient admitted.  Since admission overall mom does not think anasarca has improved.  In addition patient has developed acute encephalopathy with agitated delirium.  This is now been going on for the last 2 days.  It is unchanged to the mother.  Also intermittent tachypnea.  Therefore critical care medicine has been consulted. MELD score 12/29/2017 is 16   Home pysch meds: lithium, latuda, lamictal  Mom is very astute about the consequences of  cirrhosis and has discussed care limitations with [place].  Subjective Looks worse  VITAL SIGNS: BP (!) 138/93   Pulse (!) 50   Temp (!) 97.5 F (36.4 C) (Axillary)   Resp 20   Ht 5\' 5"  (1.651 m)   Wt 191 lb 5.8 oz (86.8 kg)   LMP  (LMP Unknown)   SpO2 97%   BMI 31.84 kg/m   HEMODYNAMICS:    VENTILATOR SETTINGS:    INTAKE / OUTPUT:  Intake/Output Summary (Last 24 hours) at 12/29/2017 1047 Last data filed at 12/29/2017 0600 Gross per 24 hour  Intake 1536.45 ml  Output 3100 ml  Net -1563.55 ml     EXAM  General: This 47 year old white female remains encephalopathic and confused hollers out at night, agitated, hallucinating. HEENT:.  Her sclerae are icteric with scleral edema as well otherwise mucous membranes are moist she has not tube in place Pulmonary: Appears improved Cardiac: Regular rate and rhythm Abdomen: Soft, nontender, positive bowel sounds, no bowel movement. Extremities/musculoskeletal: Equal strength, diffuse anasarca. Neuro/psych: Moves all extremities, extremely confused and agitated at times  LABS  PULMONARY No results for input(s): PHART, PCO2ART, PO2ART, HCO3, TCO2, O2SAT in the last 168 hours.  Invalid input(s): PCO2, PO2  CBC Recent Labs  Lab 12/26/17 0711 12/28/17 0340 12/29/17 0455  HGB 9.3* 10.4* 11.0*  HCT 27.9* 31.1* 33.4*  WBC 20.4* 19.1* 17.6*  PLT 174 174 188    COAGULATION Recent Labs  Lab 01-10-2018 1345 12/23/17 0352 12/28/17 0340  INR 1.47 1.51 2.92  CARDIAC   Recent Labs  Lab 12/10/2017 1345  TROPONINI <0.03   No results for input(s): PROBNP in the last 168 hours.   CHEMISTRY Recent Labs  Lab 12/24/17 0449 12/25/17 0509 12/26/17 0711 12/27/17 1124 12/28/17 0340 12/28/17 1105 12/28/17 1748 12/29/17 0455  NA 135 137 138  --  147*  --   --  149*  K 3.7 3.6 3.3*  --  2.8*  --   --  2.3*  CL 106 108 110  --  115*  --   --  115*  CO2 19* 19* 18*  --  21*  --   --  26  GLUCOSE 99 94 77  --  144*  --   --   161*  BUN 8 6 6   --  10  --   --  11  CREATININE 0.82 0.83 0.88  --  0.77  --   --  0.73  CALCIUM 8.6* 8.8* 8.6*  --  8.0*  --   --  8.6*  MG 2.1  --   --  1.6* 1.9 1.9 2.2 1.9  PHOS  --   --   --  3.2 3.0 2.5 2.4* 1.8*   Estimated Creatinine Clearance: 95.6 mL/min (by C-G formula based on SCr of 0.73 mg/dL).   LIVER Recent Labs  Lab 12/05/2017 1345 12/23/17 0352 12/24/17 0449 12/26/17 1238 12/28/17 0340  AST 60* 60* 49* 48* 36  ALT 31 31 26 25 19   ALKPHOS 148* 154* 115 107 94  BILITOT 4.1* 4.5* 3.4* 4.3* 4.6*  PROT 5.4* 5.2* 5.4* 4.7* 4.5*  ALBUMIN 2.2* 2.1* 3.2* 2.7* 2.5*  INR 1.47 1.51  --   --  2.92     INFECTIOUS Recent Labs  Lab 12/23/17 0352 12/25/17 0509 12/26/17 1238 12/27/17 1124 12/28/17 0340  LATICACIDVEN 2.3* 1.9 2.3*  --   --   PROCALCITON  --   --  0.36 0.47 0.51     ENDOCRINE CBG (last 3)  Recent Labs    12/28/17 2312 12/29/17 0313 12/29/17 0734  GLUCAP 160* 149* 149*     IMAGING x48h  - image(s) personally visualized  -   highlighted in bold Dg Abd Portable 1v  Result Date: 12/27/2017 CLINICAL DATA:  Status post feeding tube placement. EXAM: PORTABLE ABDOMEN - 1 VIEW COMPARISON:  Portable chest x-ray of earlier today. FINDINGS: The esophagogastric tube has been withdrawn. The feeding tube is coiled in the gastric body. There are loops of mildly distended small bowel in the left aspect of the abdomen. There is a moderate amount of gas within normal caliber colon in the right lower quadrant. No free extraluminal gas collections are observed. IMPRESSION: The feeding tube tip at approximately 10 cc of tubing are coiled within the gastric body. Electronically Signed   By: David  Swaziland[place] M.D.   On: 12/27/2017 12:37    ASSESSMENT and PLAN  Encephalopathy acute Delirum -suspect multifactorial, initially hepatic encephalopathy following several missed doses of her maintenance lactulose, but now concerned about underlying psychiatric history and  medications.  Has been on Precedex for approximately 72 hours now.  Gastroenterology questioned possibility of Wilson's disease.  I discussed this case with ophthalmology, unfortunately they did not think we could get good enough quality slit lamp exam to be diagnostic; HOWEVER: her Cerumoplasmin is 0.172; her urine copper is 62; these with the delirium using the Leipzig Scoring system give her a score of 5 (4 is diagnostic of Wilson's disease) Plan Continue rifaximin mean  and lactulose Precedex for RASS goal of 0, will discontinue Precedex today  correct metabolic derangements See above in regards to her bipolar regimen I have discussed the Leipzig score w/ GI. We will look into liver bx but given my d/w the mother she would be supportive of trial of therapy. I think best option would be acutely CRRT for rapid removal of copper and then Trientine given her MS followed by zinc . Will d/w pharmacy logistics of GI agrees.  Alternatively we could consider transfer to tertiary care.   Cirrhosis, alcoholic (HCC) Last drink late Jan 2019,. Dx of early-mid feb admission - alcoholic hepatis and given prednisolone which sshe could not fill at discharge. 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  Could be an element of pulmonary edema here as well -Procalcitonin not impressive -White blood cell count trending down Plan Day #6 of 7 Zosyn Continue procalcitonin monitoring   Medication management D/w mom. Mom aware that long term prognosis from cirrhosis poor and renal dysfunction means evern poorer  prognosis Plan Full medical scope of care.  Ventilator Short-term okay No CPR or defibrillation  Hypothyroidism Plan Continue Synthroid supplementation   Bipolar disease, chronic (HCC) She is been seen by psychiatry, very much appreciate their recommendations. Plan Cont  lithium 300 mg twice daily Lamictal  100 mg every at bedtime Reduce  Latuda to 40 mg If no improvement we can discontinue Latuda and increase lithium to 450 twice daily     Electrolyte and fluid disorder Hypokalemia and hypernatremia as well as hyperchloremia and hypophosphatemia  plan Replace and recheck Added free water via D5W at 55 Free water replacement via tube  Metabolic acidosis Hyperchloremic metabolic non-anion gap acidosis; also likely component of bicarbonate loss with stool Plan Free water replacement We will consider bicarbonate replacement orally if acid-base continues to worsen  Pulmonary edema Pulmonary edema with diffuse anasarca -Has been getting aggressive diuresis now almost 4 L net negative -No worsening of oxygen status, work of breathing appears improved Plan Continue current scheduled diuretics Continue strict intake and output Repeat x-ray in a.m.   Pressure injury of skin Plan Continue pressure relief interventions  Hypotension Drug-induced Plan Continue Neo-Synephrine as needed via PICC line  Protein calorie malnutrition (HCC) Plan tubefeeds      FAMILY  - Updates: 12/29/2017 --> mom at bedside in 4thr  Floor [place]  - Inter-disciplinary family meet or Palliative Care meeting due by:  DAy 7. Current LOS is LOS 7 days  My cct 55 min   Simonne Martinet ACNP-BC [facility] Pulmonary/Critical Care Pager # [phone] OR # [phone] if no answer

## 2017-12-29 NOTE — Plan of Care (Signed)
  Clinical Measurements: Will remain free from infection 12/29/2017 1841 - Progressing by Christianne Borrowoberts, [place]over Priestly J, RN

## 2017-12-29 NOTE — Progress Notes (Signed)
Ophthalmology will be coming this evening to do eye exam. [facility] if eye exam negative. We have spoken with Katherina RightJay Watts, MD in IR about the biopsy which can be done tomorrow afternoon.  -will order Vitamin K  SQ x 3 days -she will need FFP prior to liver bx and Anders SimmondsPete Babcock, NP with PCCM is coordinating this.  [facility] leaning more toward ETOH liver disease rather than Wilson's disease. Patient is s/p gastric bypass and apparently this can result in low ceruloplasmin levels.

## 2017-12-29 NOTE — Progress Notes (Signed)
Addendum to previous note:  Patient just had a very large BM. Will therefore not increase lactulose to Q 2 hours. Continue TID

## 2017-12-29 NOTE — Progress Notes (Signed)
.  [facility] LAB REPLACEMENT ONLY  The patient does [facility] based on the criteria listed below:   1. Is GFR >/= 40 ml/min? Yes.    Patient's GFR today is >60 2. Is urine output >/= 0.5 ml/kg/hr for the last 6 hours? Yes.   Patient's UOP is 2 ml/kg/hr 3. Is BUN < 60 mg/dL? Yes.    Patient's BUN today is 11 4. Abnormal electrolyte(s): K was 2.3 5. Ordered repletion with: per protocol 6. If a panic level lab has been reported, has the CCM MD in charge been notified? Yes.  .   Physician:  Dr. Roxy Mannseterding  Amy Powers, Amy Powers 12/29/2017 6:22 AM

## 2017-12-29 NOTE — Consult Note (Signed)
  CC/Reason for consultation: Eye examination for possible Kaiser-Fleischer Ring  HPI: 46 yo female with cirrhosis, hepatic encephalopathy, consultation received to investigate possibly of Wilson's disease. Hx of low ceruloplasmin, elevated 24-hour urine copper, though these values have not been completely diagnostic. Liver biopsy also being considered, though higher risk due to coagulopathy. No known ocular history, no family at bedside to provide further history at the time of exam.  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 Conj/sclera: chemosis inferiorly OU, no significant injection OU K: no epithelial defects OU, clear centrally OU, no obvious Kaiser-Fleischer ring, superior/Inferior/nasal/temporal limbus does not appear brunescent OU AC: deep/quiet OU Iris: flat and round OU Lens: Clear OU, no sunflower cataract OU  Dilation with phenylephrine 2.5% and tropicamide 1% OU @ 1950 hours  Fundus Vit: clear OU Disc: sharp and pink OU, OD c/d 0.4, OS c/d 0.5 Mac: flat and dry OU Vessels: normal distribution OU Periphery: flat and attached 360 OU, OS single microaneurysem temporal to the macula and inferotemporal small are of RPE atrophy  Impression/Plan:  1. No ocular evidence of Wilson's disease OU - When prominent, Kaiser-Fleischer ring can be seen by penlight/external exam - Exam limited by unavailability of binocular slit lamp and ICU setting, though no evidence in current exam for either Kaiser-Fleischer ring or sunflower cataract, both typical of  advanced Wilsons disease and other causes of ocular chalcosis  2. Chemosis and Lagophthalmos OU - recommend AT ointment/lacrilube q4h scheduled  3. Retinal Microaneurysm OS - often associated with mild hypertension and/or diabetes, recommend yearly DFE pending patient disposition  - Lonia Skinner, MD Ophthalmology

## 2017-12-29 NOTE — Progress Notes (Signed)
Nutrition Follow-up  DOCUMENTATION CODES:   Obesity unspecified  INTERVENTION:  - Continue 30 mL Prostat x1/day. - Osmolite 1.5 increased to 40 mL/hr at 8 AM. Hold this rate until RD assessment tomorrow given current labs.  - Free water flush order to be per MD/NP (currently 200 mL every 4 hours).  Monitor magnesium, potassium, and phosphorus daily for at least 3 days, MD to replete as needed, as pt is at risk for refeeding syndrome given hx of alcohol abuse with cirrhosis, severe edema to all extremities, worsening hypokalemia, new hypophosphatemia.   NUTRITION DIAGNOSIS:   Inadequate oral intake related to inability to eat as evidenced by NPO status. -ongoing  GOAL:   Patient will meet greater than or equal to 90% of their needs -unmet with TF not yet at goal rate; will be met at goal rate.   MONITOR:   TF tolerance, Weight trends, Labs, Skin, I & O's  ASSESSMENT:   47 year old female lifelong alcoholic and prior hx of bipolar.  Lost her job in December 2018 and then became depressed.  After that mother suspects patient has been drinking.  Patient lives alone and does not have any family members locally.  Weight today is consistent with weight yesterday. Panda in place and pt is receiving Osmolite 1.5 @ 40 mL/hr with 30 mL Prostat once/day and 200 mL free water every 4 hours. This regimen is providing 1540 kcal, 75 grams of protein, and 1931 mL free water. Goal rate for Osmolite 1.5 is 60 mL/hr and TF is increasing by 10 mL every 8 hours d/t risk of refeeding.   Spoke with RN at bedside who reports pt's abdomen is taught, distended but does not feel any more taught than prior to TF start. She reports plan for enema(s) today.   Reviewed Pete's, PCCM NP, note from this AM in detail. Pt with Leipzig Score of 5 and a score of 4 is diagnostic of Wilson's disease. Discussions being had with GI.   Medications reviewed; 1 mg folic acid per Panda/day, 40 mg IV Lasix BID, 30 g lactulose  per Panda QID, 100 mcg Synthroid per Panda/day, 2 g IV Mg sulfate x1 run yesterday, 40 mEq KCl per Panda x3 today and x1 yesterday, 30 mmol IV KPhos x1 run today, 40 mg prednisolone per Panda/day, 50 mg Aldactone per Panda x1 today, 100 mg Aldactone per Panda/day starting tomorrow, 100 mg IV thiamine/day.   Labs reviewed; CBGs: 149  x2 mg/dL this AM, Na: 149 mmol/L, K: 2.3 mmol/L (down from yesterday), Cl: 115 mmol/L, Ca: 8.6 mg/dL, Phos: 1.8 mg/dL.  IVF: D5 @ 55 mL/hr (224 kcal). Drips: Precedex @ 1 mcg/kg/hr, Neo @ 20 mcg/min.    Diet Order:  Fall precautions  EDUCATION NEEDS:   No education needs have been identified at this time  Skin:  Skin Assessment: Skin Integrity Issues: Skin Integrity Issues:: Stage II Stage II: stage 2 bilateral buttocks  Last BM:  2/28  Height:   Ht Readings from Last 1 Encounters:  12/26/17 _0  (1.651 m)    Weight:   Wt Readings from Last 1 Encounters:  12/29/17 191 lb 5.8 oz (86.8 kg)    Ideal Body Weight:  56.82 kg  BMI:  Body mass index is 31.84 kg/m.  Estimated Nutritional Needs:   Kcal:  2542-7062 (25-27 kcal/kg)  Protein:  105-122 grams (1.2-1.4 grams/kg)  Fluid:  >/= 2 L/day       Jarome Matin, MS, RD, LDN, CNSC Inpatient Clinical Dietitian Pager #  [phone] After hours/weekend pager # [phone]

## 2017-12-29 NOTE — Progress Notes (Signed)
[facility] Progress Note   Chief Complaint:   Probable cirrhosis / encephalopathy    SUBJECTIVE:    some agitation when examined / moved around.    ASSESSMENT AND PLAN:   1. 47 yo female with probable cirrhosis. Hx of ETOH and being treated for etoh hepatitis but now a concern for Wilson's disease. On lactulose and xifaxan with no improvement in mental status. Ophthalmology unable to do slit lamp exam to evaluate for KF rings.  Given low ceruloplasmin, equivocal urine copper level and persistent neurologic sx we are still suspicious of Wilson's disease and are contemplating treatment which could be at expense of worsening neurological symptoms. Anders SimmondsPete Babcock, NP with PCCM has already had long conversation with patient' s mother about treatment and potential side effects.  -I will contact IR about liver biopsy to try and get a definitive diagnosis of Wilson's. Of course results may take a few days during which time and she could further deteriorate. We are likely to proceed with treatment, assuming hospital can get access to it.  -We are also discussing case with [facility] . -discontinuing prednisolone -continue lactulose and xifaxan. . Still no significant BMs. Will temporarily increase lactulose to Q 2 hours. Decrease back to TID when having BMs   2. Hypokalemia.  -increasing aldactone to 100mg  daily, this should help. BP okay, on pressor  3. HCAP, on antibiotics.    OBJECTIVE:    Vital signs in last 24 hours: Temp:  [97.4 F (36.3 C)-98.1 F (36.7 C)] 97.5 F (36.4 C) (02/28 0800) Pulse Rate:  [50-64] 50 (02/28 0600) Resp:  [18-34] 20 (02/28 0600) BP: (87-153)/(59-103) 138/93 (02/28 0600) SpO2:  [93 %-98 %] 97 % (02/28 0600) Weight:  [191 lb 5.8 oz (86.8 kg)] 191 lb 5.8 oz (86.8 kg) (02/28 0456) Last BM Date: 12/26/17 General:   Alert, well-developed, white female in NAD Eyes:  icteric sclera Heart:  Regular rate and rhythm, + anasarca Pulm:  Normal respiratory effort Abdomen:  Soft,mildly distended with tympany, a few bowel sounds.     Neurologic:  Confused, MAE, moaning. Psych:  Pleasant, cooperative.  Normal mood and affect.   Intake/Output from previous day: 02/27 0701 - 02/28 0700 In: 2289.5 [I.V.:1782.2; NG/GT:307.3; IV Piggyback:200] Out: 3225 [Urine:3225] Intake/Output this shift: No intake/output data recorded.  Lab Results: Recent Labs    12/28/17 0340 12/29/17 0455  WBC 19.1* 17.6*  HGB 10.4* 11.0*  HCT 31.1* 33.4*  PLT 174 188   BMET Recent Labs    12/28/17 0340 12/29/17 0455  NA 147* 149*  K 2.8* 2.3*  CL 115* 115*  CO2 21* 26  GLUCOSE 144* 161*  BUN 10 11  CREATININE 0.77 0.73  CALCIUM 8.0* 8.6*   LFT Recent Labs    12/28/17 0340  PROT 4.5*  ALBUMIN 2.5*  AST 36  ALT 19  ALKPHOS 94  BILITOT 4.6*  BILIDIR 1.9*  IBILI 2.7*   PT/INR Recent Labs    12/28/17 0340  LABPROT 30.3*  INR 2.92     Principal Problem:   Medication management Active Problems:   Hypotension   Bipolar disease, chronic (HCC)   Encephalopathy acute   Hypothyroidism   HCAP (healthcare-associated pneumonia)   Cirrhosis, alcoholic (HCC)   Pressure injury of skin   Electrolyte and fluid disorder   Pulmonary edema   Protein calorie malnutrition (HCC)     LOS: 7 days   Willette ClusterPaula Guenther ,NP 12/29/2017, 9:34 AM  Pager number [phone]

## 2017-12-29 NOTE — Progress Notes (Signed)
eLink Physician-Brief Progress Note Patient Name: Bebe Literaige Sweetman DOB: 1971/06/04 MRN: [id number]   Date of Service  12/29/2017  HPI/Events of Note  Hyopkalemia  eICU Interventions  Potassium replaced     Intervention Category Intermediate Interventions: Electrolyte abnormality - evaluation and management  Eldwin Volkov 12/29/2017, 9:57 PM

## 2017-12-30 ENCOUNTER — Encounter (HOSPITAL_COMMUNITY): Payer: Self-pay | Admitting: Interventional Radiology

## 2017-12-30 ENCOUNTER — Inpatient Hospital Stay (HOSPITAL_COMMUNITY): Payer: BLUE CROSS/BLUE SHIELD

## 2017-12-30 DIAGNOSIS — R600 Localized edema: Secondary | ICD-10-CM

## 2017-12-30 DIAGNOSIS — A419 Sepsis, unspecified organism: Secondary | ICD-10-CM

## 2017-12-30 HISTORY — PX: IR TRANSCATHETER BX: IMG713

## 2017-12-30 HISTORY — PX: IR VENOGRAM HEPATIC WO HEMODYNAMIC EVALUATION: IMG693

## 2017-12-30 HISTORY — PX: IR US GUIDE VASC ACCESS RIGHT: IMG2390

## 2017-12-30 LAB — CBC WITH DIFFERENTIAL/PLATELET
Basophils Absolute: 0 10*3/uL (ref 0.0–0.1)
Basophils Relative: 0 %
EOS ABS: 1.6 10*3/uL — AB (ref 0.0–0.7)
Eosinophils Relative: 7 %
HCT: 38.7 % (ref 36.0–46.0)
HEMOGLOBIN: 12.1 g/dL (ref 12.0–15.0)
LYMPHS ABS: 3.1 10*3/uL (ref 0.7–4.0)
Lymphocytes Relative: 14 %
MCH: 37.3 pg — AB (ref 26.0–34.0)
MCHC: 31.3 g/dL (ref 30.0–36.0)
MCV: 119.4 fL — ABNORMAL HIGH (ref 78.0–100.0)
MONO ABS: 1.8 10*3/uL — AB (ref 0.1–1.0)
MONOS PCT: 8 %
NEUTROS ABS: 15.7 10*3/uL — AB (ref 1.7–7.7)
NRBC: 1 /100{WBCs} — AB
Neutrophils Relative %: 71 %
Platelets: 170 10*3/uL (ref 150–400)
RBC: 3.24 MIL/uL — ABNORMAL LOW (ref 3.87–5.11)
RDW: 14.7 % (ref 11.5–15.5)
WBC: 22.2 10*3/uL — ABNORMAL HIGH (ref 4.0–10.5)

## 2017-12-30 LAB — PHOSPHORUS: Phosphorus: 2.1 mg/dL — ABNORMAL LOW (ref 2.5–4.6)

## 2017-12-30 LAB — BASIC METABOLIC PANEL
Anion gap: 7 (ref 5–15)
BUN: 13 mg/dL (ref 6–20)
CALCIUM: 8.3 mg/dL — AB (ref 8.9–10.3)
CO2: 25 mmol/L (ref 22–32)
CREATININE: 0.67 mg/dL (ref 0.44–1.00)
Chloride: 118 mmol/L — ABNORMAL HIGH (ref 101–111)
GFR calc Af Amer: >60 mL/min (ref >60)
GLUCOSE: 133 mg/dL — AB (ref 65–99)
Potassium: 3.7 mmol/L (ref 3.5–5.1)
Sodium: 150 mmol/L — ABNORMAL HIGH (ref 135–145)

## 2017-12-30 LAB — HEPATIC FUNCTION PANEL
ALT: 22 U/L (ref 14–54)
AST: 56 U/L — AB (ref 15–41)
Albumin: 2.4 g/dL — ABNORMAL LOW (ref 3.5–5.0)
Alkaline Phosphatase: 124 U/L (ref 38–126)
BILIRUBIN DIRECT: 1.4 mg/dL — AB (ref 0.1–0.5)
BILIRUBIN TOTAL: 3.7 mg/dL — AB (ref 0.3–1.2)
Indirect Bilirubin: 2.3 mg/dL — ABNORMAL HIGH (ref 0.3–0.9)
Total Protein: 4.9 g/dL — ABNORMAL LOW (ref 6.5–8.1)

## 2017-12-30 LAB — CULTURE, BLOOD (ROUTINE X 2)
CULTURE: NO GROWTH
Culture: NO GROWTH
Special Requests: ADEQUATE
Special Requests: ADEQUATE

## 2017-12-30 LAB — GLUCOSE, CAPILLARY
GLUCOSE-CAPILLARY: 106 mg/dL — AB (ref 65–99)
GLUCOSE-CAPILLARY: 106 mg/dL — AB (ref 65–99)
Glucose-Capillary: 121 mg/dL — ABNORMAL HIGH (ref 65–99)
Glucose-Capillary: 101 mg/dL — ABNORMAL HIGH (ref 65–99)
Glucose-Capillary: 119 mg/dL — ABNORMAL HIGH (ref 65–99)

## 2017-12-30 LAB — MAGNESIUM: Magnesium: 2.3 mg/dL (ref 1.7–2.4)

## 2017-12-30 LAB — PROTIME-INR
INR: 1.84
Prothrombin Time: 21.1 seconds — ABNORMAL HIGH (ref 11.4–15.2)

## 2017-12-30 LAB — HAPTOGLOBIN: HAPTOGLOBIN: 105 mg/dL (ref 34–200)

## 2017-12-30 MED ORDER — LIDOCAINE HCL 1 % IJ SOLN
INTRAMUSCULAR | Status: AC | PRN
  Administered 2017-12-30: 10 mL

## 2017-12-30 MED ORDER — IOPAMIDOL (ISOVUE-300) INJECTION 61%
INTRAVENOUS | Status: AC
  Administered 2017-12-30: 10 mL via INTRAVENOUS
  Filled 2017-12-30: qty 50

## 2017-12-30 MED ORDER — IOPAMIDOL (ISOVUE-300) INJECTION 61%
25.0000 mL | Freq: Once | INTRAVENOUS | Status: AC | PRN
Start: 2017-12-30 — End: 2017-12-30
  Administered 2017-12-30: 10 mL via INTRAVENOUS

## 2017-12-30 MED ORDER — DEXTROSE 5 % IV SOLN
30.0000 mmol | Freq: Once | INTRAVENOUS | Status: AC
  Administered 2017-12-30: 30 mmol via INTRAVENOUS
  Filled 2017-12-30: qty 10

## 2017-12-30 MED ORDER — OSMOLITE 1.5 CAL PO LIQD
1000.0000 mL | ORAL | Status: DC
  Administered 2018-01-01: 1000 mL
  Filled 2017-12-30 (×6): qty 1000

## 2017-12-30 MED ORDER — LITHIUM CITRATE 300 MG/5 ML PO SYRP
450.0000 mg | Freq: Two times a day (BID) | ORAL | Status: DC
  Administered 2017-12-30 – 2018-01-01 (×6): 450 mg
  Filled 2017-12-30 (×11): qty 7.5

## 2017-12-30 MED ORDER — MIDAZOLAM HCL 2 MG/2ML IJ SOLN
INTRAMUSCULAR | Status: AC
  Filled 2017-12-30: qty 2

## 2017-12-30 MED ORDER — LIDOCAINE HCL 1 % IJ SOLN
INTRAMUSCULAR | Status: AC
  Filled 2017-12-30: qty 20

## 2017-12-30 MED ORDER — FENTANYL CITRATE (PF) 100 MCG/2ML IJ SOLN
INTRAMUSCULAR | Status: AC
  Filled 2017-12-30: qty 2

## 2017-12-30 MED ORDER — LACTULOSE ENEMA
300.0000 mL | Freq: Two times a day (BID) | ORAL | Status: DC
  Administered 2017-12-30 – 2018-01-03 (×9): 300 mL via RECTAL
  Filled 2017-12-30 (×14): qty 300

## 2017-12-30 NOTE — Progress Notes (Addendum)
[facility] Progress Note   Chief Complaint:  Encephalopathy, probable cirrhosis    SUBJECTIVE:    none. Obtunded.   ASSESSMENT AND PLAN:   47 yo with encephalopathy / probable cirrhosis. Still evaluating for Wilson's Disease. Appreciate Opth evaluation. Eye exam negative for KF rings.  She got liver bx today. Unfortunately Idalia Needleaige continues to deteriorate. We entertained treating empirically for Wilson's and even discussed with [facility] yesterday. At this point it doesn't seem feasible to proceed with treatment given lack of diagnosis and potential risks of treatment.  -Patient made DNR by PCCM today.  -awaiting liver bx results and also [facility] for Wilson's disease. Even if positive for Wilson's , Etoh still likely contributing largely to her decompensation. -worsening encephalopathy. Getting xifaxan. Now on lactulose enemas.    OBJECTIVE:     Vital signs in last 24 hours: Temp:  [97.4 F (36.3 C)-99.6 F (37.6 C)] 97.8 F (36.6 C) (03/01 1530) Pulse Rate:  [53-102] 81 (03/01 1634) Resp:  [17-37] 30 (03/01 1634) BP: (77-124)/(45-92) 97/73 (03/01 1634) SpO2:  [92 %-100 %] 93 % (03/01 1634) Weight:  [189 lb 6 oz (85.9 kg)] 189 lb 6 oz (85.9 kg) (03/01 0630) Last BM Date: 12/30/17 General:   Obtunded EENT:  Normal hearing, non icteric sclera, conjunctive pink.  Heart:  Regular rate and rhythm; no murmurs, generalized edema.  Abdomen:  Soft, nondistended, nontender.  Normal bowel sounds, no masses felt. No hepatomegaly.    Neurologic:  Not responding to verbal command  Intake/Output from previous day: 02/28 0701 - 03/01 0700 In: 3144.6 [I.V.:2446.4; NG/GT:698.2] Out: 2875 [Urine:2675; Stool:200] Intake/Output this shift: Total I/O In: 412.7 [I.V.:412.7] Out: 71 [Urine:70; Emesis/NG output:1]  Lab Results: Recent Labs    12/28/17 0340 12/29/17 0455 12/30/17 0500  WBC 19.1* 17.6* 22.2*  HGB 10.4* 11.0* 12.1   HCT 31.1* 33.4* 38.7  PLT 174 188 170   BMET Recent Labs    12/29/17 0455 12/29/17 2030 12/30/17 0500  NA 149* 150* 150*  K 2.3* 3.1* 3.7  CL 115* 116* 118*  CO2 26 26 25   GLUCOSE 161* 162* 133*  BUN 11 11 13   CREATININE 0.73 0.61 0.67  CALCIUM 8.6* 8.2* 8.3*   LFT Recent Labs    12/30/17 0500  PROT 4.9*  ALBUMIN 2.4*  AST 56*  ALT 22  ALKPHOS 124  BILITOT 3.7*  BILIDIR 1.4*  IBILI 2.3*   PT/INR Recent Labs    12/29/17 1044 12/30/17 0500  LABPROT 26.8* 21.1*  INR 2.50 1.84    Principal Problem:   Medication management Active Problems:   Hypotension   Bipolar disease, chronic (HCC)   Encephalopathy acute   Hypothyroidism   HCAP (healthcare-associated pneumonia)   Cirrhosis, alcoholic (HCC)   Pressure injury of skin   Electrolyte and fluid disorder   Pulmonary edema   Protein calorie malnutrition (HCC)   Shock circulatory (HCC)     LOS: 8 days   Willette ClusterPaula Guenther ,NP 12/30/2017, 4:52 PM  Pager number [phone]    Attending physician's note   I have taken an interval history, reviewed the chart and examined the patient. I agree with the Advanced Practitioner's note, impression and recommendations.  47 year old female with cirrhosis decompensated with ascites, hepatic encephalopathy.  She was admitted initially December 06, 2017 alcohol withdrawal and acute alcoholic hepatitis, Discriminant function score greater than 32 and was started on prednisolone 40 mg daily.  She had low borderline  ceruloplasmin level of 17.2 ( normal is greater than 19 )and urine copper 60 (normal is less than 40) .  Ceruloplasmin level can be low in the setting of acute alcoholic hepatitis and she is also status post gastric bypass which can also lower the ceruloplasmin level.  No KF ring on slit-lamp but according to Dr. Sherrine Maples the exam was limited.  Serum copper level is still pending It is unclear if patient took prednisolone 40 mg as outpatient but she was on it for over a  week as inpatient with no significant improvement of bilirubin.  It was discontinued on readmission on February 28.  Had a long discussion with her mother, she thinks she has been drinking heavily in the past 3-4 years since her divorce in 2014 and prior to that also had history of heavy alcohol use for 12-14 years. Meld ~19 Await liver biopsy to exclude Wilson's disease, will hold off chelation therapy until it is confirmed.  I do believe alcohol is the predominant factor for overall deterioration and worsening hepatitis Continue lactulose and rifaximin Continue supportive care   K Scherry Ran, MD [phone] Mon-Fri 8a-5p [phone] after 5p, weekends, holidays

## 2017-12-30 NOTE — Progress Notes (Signed)
Nutrition Follow-up  DOCUMENTATION CODES:   Obesity unspecified  INTERVENTION:    Increase Osmolite 1.5 to 50 mL/hr (1200 mL daily)   Continue 30 mL Pro-Stat daily  Tube feeding regimen provides 1900 kcal, 80 grams of protein, and 912 ml of H2O.   Per MD order, pt currently receiving 200 mL free water q 4 hours, total free water 2112 mL H20  Continue to monitor magnesium, potassium, and phosphorus, MD to replete as needed  NUTRITION DIAGNOSIS:   Inadequate oral intake related to inability to eat as evidenced by NPO status.  Ongoing   GOAL:   Patient will meet greater than or equal to 90% of their needs  Unmet but progressing via TF regimen   MONITOR:   TF tolerance, Weight trends, Labs, Skin, I & O's  ASSESSMENT:   47 year old female lifelong alcoholic and prior hx of bipolar.  Lost her job in December 2018 and then became depressed.  After that mother suspects patient has been drinking.  Patient lives alone and does not have any family members locally.  Discussed pt with RN. Pt's abdomen remains taught. Pt received lactulose and s/p multiple BM. TF on hold for liver biopsy planned for today.  Per chart, pt with emesis episode this AM. Weight records reveal pt down 2 lbs since yesterday. Per chart, pt net negative since admission.  Labs reviewed; CBG 101-131, Na 150, Chloride 118, Phosphorus 2.1 (down from yesterday), Albumin 2.4, K (up from yesterday) WNL and Magnesium WNL  Medications reviewed; Folic acid, IV Lasix, Lactulose, vitamin K, thiamine, 30 mmol potassium phosphate  IVF: D5 at 65 mL/hr   Diet Order:  Fall precautions Diet NPO time specified  EDUCATION NEEDS:   No education needs have been identified at this time  Skin:  Skin Assessment: Skin Integrity Issues: Skin Integrity Issues:: Stage II Stage II: stage 2 bilateral buttocks  Last BM:  12/30/17  Height:   Ht Readings from Last 1 Encounters:  12/26/17 5\' 5"  (1.651 m)    Weight:   Wt  Readings from Last 1 Encounters:  12/30/17 189 lb 6 oz (85.9 kg)    Ideal Body Weight:  56.82 kg  BMI:  Body mass index is 31.51 kg/m.  Estimated Nutritional Needs:   Kcal:  [id number] (25-27 kcal/kg)  Protein:  105-122 grams (1.2-1.4 grams/kg)  Fluid:  >/= 2 L/day  Fransisca KaufmannAllison Ioannides, MS, RDN, LDN 12/30/2017 1:13 PM

## 2017-12-30 NOTE — Progress Notes (Signed)
Dr. Darrick Pennaeterding aware of pts BP.  Ok to still give lasix

## 2017-12-30 NOTE — Procedures (Signed)
Interventional Radiology Procedure Note  Procedure: Transjugular liver biopsy  Complications: None  Estimated Blood Loss: < 10 mL  Findings: Via 10 Fr TIPS sheath, samples of liver parenchyma made through middle hepatic vein.  Venography shows normally patent, small caliber vein. Two 19 G core samples submitted for histology. One core submitted for quantitative copper.  Amy MarbleGlenn T. Fredia Powers, M.D Pager:  [phone]

## 2017-12-30 NOTE — Progress Notes (Signed)
PULMONARY / CRITICAL CARE MEDICINE   Name: Amy Powers MRN: [id number] DOB: [birth year] PCP Copland, Gwenlyn Found, MD LOS 8 as of 12/30/2017     ADMISSION DATE:  January 14, 2018 CONSULTATION DATE:  12/30/2017   REFERRING MD:  [facility] hospitalist  CHIEF COMPLAINT:  Acute encephaloathy  HISTORY OF PRESENT ILLNESS:   History is provided by the mother and review of the chart.  Patient is unable to give history because of acute encephalopathy  47 year old female lifelong alcoholic and prior hx of bipolar.  Lost her job in December 2018 and then became depressed.  After that mother suspects patient has been drinking.  Patient lives alone and does not have any family members locally.  Closest family member his mother in [place] and a sibling in [place] and another sibling in [place].  There are no children or friends [place].  She has been divorced for the last 4 years.  Then approximately December 06, 2017 patient was admitted for ascites, anasarca and hepatic encephalopathy and mild rhabdo (CK 1700) according to the mother.  Discharge around December 16, 2017 and was doing well with improved mobility but using a walker.  But according to the mother patient ran out of lactulose because there was no refill on this from the hospital. ALso given prednisonolne at discharge but mom says pharmacy was out of this so not given.  Patient is also having worsening ascites and anasarca.  Finally seen by primary care physician 01/14/18 and sent to the emergency room and patient admitted.  Since admission overall mom does not think anasarca has improved.  In addition patient has developed acute encephalopathy with agitated delirium.  This is now been going on for the last 2 days.  It is unchanged to the mother.  Also intermittent tachypnea.  Therefore critical care medicine has been consulted. MELD score 12/30/2017 is 16   Home pysch meds: lithium, latuda, lamictal  Mom is very astute about the consequences of cirrhosis  and has discussed care limitations with [place].  Subjective Clinically cont to decline   VITAL SIGNS: BP 92/69   Pulse 80   Temp 99.6 F (37.6 C) (Axillary)   Resp (!) 25   Ht 5\' 5"  (1.651 m)   Wt 189 lb 6 oz (85.9 kg)   LMP  (LMP Unknown)   SpO2 97%   BMI 31.51 kg/m   HEMODYNAMICS:    VENTILATOR SETTINGS:    INTAKE / OUTPUT:  Intake/Output Summary (Last 24 hours) at 12/30/2017 0839 Last data filed at 12/30/2017 0400 Gross per 24 hour  Intake 3144.56 ml  Output 2375 ml  Net 769.56 ml     EXAM  General: This is an acutely ill-appearing 47 year old female currently remains quite encephalopathic, agitated at times, still hallucinating. HEENT: Normocephalic atraumatic.  She now has scleral edema, sclera are icteric, mucous membranes are dry. Pulmonary: Clear to auscultation no accessory use Cardiac: Regular rate and rhythm Abdomen: Soft nontender no organomegaly Extremities/muscular skeletal: Generalized equal strength, however now appears a little more mottled Neuro/psych: Above agitated at times moves all extremities no focal deficits. LABS  PULMONARY No results for input(s): PHART, PCO2ART, PO2ART, HCO3, TCO2, O2SAT in the last 168 hours.  Invalid input(s): PCO2, PO2  CBC Recent Labs  Lab 12/28/17 0340 12/29/17 0455 12/30/17 0500  HGB 10.4* 11.0* 12.1  HCT 31.1* 33.4* 38.7  WBC 19.1* 17.6* 22.2*  PLT 174 188 170    COAGULATION Recent Labs  Lab 12/28/17 0340 12/29/17 1044 12/30/17 0500  INR 2.92 2.50 1.84    CARDIAC   No results for input(s): TROPONINI in the last 168 hours. No results for input(s): PROBNP in the last 168 hours.   CHEMISTRY Recent Labs  Lab 12/26/17 0711  12/28/17 0340 12/28/17 1105 12/28/17 1748 12/29/17 0455 12/29/17 1529 12/29/17 2030 12/30/17 0500  NA 138  --  147*  --   --  149*  --  150* 150*  K 3.3*  --  2.8*  --   --  2.3*  --  3.1* 3.7  CL 110  --  115*  --   --  115*  --  116* 118*  CO2 18*  --  21*  --   --   26  --  26 25  GLUCOSE 77  --  144*  --   --  161*  --  162* 133*  BUN 6  --  10  --   --  11  --  11 13  CREATININE 0.88  --  0.77  --   --  0.73  --  0.61 0.67  CALCIUM 8.6*  --  8.0*  --   --  8.6*  --  8.2* 8.3*  MG  --    < > 1.9 1.9 2.2 1.9 1.8  --  2.3  PHOS  --    < > 3.0 2.5 2.4* 1.8* 2.6  --  2.1*   < > = values in this interval not displayed.   Estimated Creatinine Clearance: 95.2 mL/min (by C-G formula based on SCr of 0.67 mg/dL).   LIVER Recent Labs  Lab 12/24/17 0449 12/26/17 1238 12/28/17 0340 12/29/17 1044 12/30/17 0500  AST 49* 48* 36  --  56*  ALT 26 25 19   --  22  ALKPHOS 115 107 94  --  124  BILITOT 3.4* 4.3* 4.6*  --  3.7*  PROT 5.4* 4.7* 4.5*  --  4.9*  ALBUMIN 3.2* 2.7* 2.5*  --  2.4*  INR  --   --  2.92 2.50 1.84     INFECTIOUS Recent Labs  Lab 12/25/17 0509 12/26/17 1238 12/27/17 1124 12/28/17 0340  LATICACIDVEN 1.9 2.3*  --   --   PROCALCITON  --  0.36 0.47 0.51     ENDOCRINE CBG (last 3)  Recent Labs    12/29/17 2320 12/30/17 0321 12/30/17 0751  GLUCAP 125* 121* 101*     IMAGING x48h  - image(s) personally visualized  -   highlighted in bold No results found.  ASSESSMENT and PLAN  Encephalopathy acute Bipolar disease, chronic (HCC) Delirum -suspect multifactorial, initially hepatic encephalopathy following several missed doses of her maintenance lactulose, but now concerned about underlying psychiatric history and medications.  Has been on Precedex for approximately 72 hours now.  Gastroenterology questioned possibility of Wilson's disease.   -seen by Optho. No KF rings.  HOWEVER: her Cerumoplasmin is 0.172; her urine copper is 62; these with the delirium using the [place] Scoring system give her a score of 5 (4 is diagnostic of Wilson's disease)-->but see GI note. D/w [facility]. They think unlikely Plan Cont rifaximin and lactulose  Try to wean off precedex.  Correct water balance  Precedex for RASS goal of 0, will discontinue  Precedex today  correct metabolic derangements Dc latuda  Cont Lamictal at 100mg /d Increase Lithium to 450mg  bid   Cirrhosis, alcoholic (HCC) Last drink late Jan 2019,. Dx of early-mid feb admission - alcoholic hepatis and given prednisolone which sshe could not  fill at discharge. 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  Plan D/c'd pred Cont rifaximin. Give lactulose via enema Awaiting liver bx Next intervention would be HD/pheresis; then starting Trientine  but end-point challenging. [facility] recommends definitive diagnostics (liver bx) and not empiric rx     HCAP (healthcare-associated pneumonia) -Chest chest x-ray personally reviewed from 2/25 demonstrates new right greater than left patchy infiltrate.  Could be an element of pulmonary edema here as well -Procalcitonin not impressive Plan Day 7/7 zosyn    Hypothyroidism Plan Synthroid daily    Electrolyte and fluid disorder: hypernatremia as well as hyperchloremia and hypophosphatemia  plan Adjust free water Replace phosphorus  Repeat Chem in am   Pulmonary edema Pulmonary edema with diffuse anasarca -No worsening of oxygen status, work of breathing appears improved -WOB unchanged.  Plan Wean oxygen for sats > 92%  Pressure injury of skin Plan Cont pressure relief interventions  Hypotension Drug-induced Plan Transduce CVP Neo for MAP > 65  Protein calorie malnutrition (HCC) Plan Cont tubefeeds   Medication management D/w mom. Mom aware that long term prognosis from cirrhosis poor and renal dysfunction means evern poorer prognosis Plan Cont current medical care No Code if arrests.  Will d/w mom-->clinically declined. I think we should make full DNR especially if this is NOT wilsons   FAMILY  - Updates: 12/30/2017  --> mom at bedside in 4thr  Floor [place]  - Inter-disciplinary family meet or Palliative Care meeting due by:  DAy 7. Current LOS is LOS 8 days  My cct 45 min  Simonne MartinetPeter E Alyvia Derk ACNP-BC [facility]/Critical [facility] Pager # [phone] OR # [phone] if no answer

## 2017-12-30 NOTE — Progress Notes (Signed)
Spoke w/ mother Starleen BlueGeorge Ann re: goals of care. At this point the patient has been slowly declining. She is very concerned about the quality of her life and her daughter NOT suffering. Based on this we will change her to full DNR.   -will continue neo. And titrate but not add pressors & and is now full DNR  Simonne MartinetPeter E Hezakiah Champeau ACNP-BC [facility]/Critical Care Pager # [phone] OR # [phone] if no answer

## 2017-12-30 NOTE — Plan of Care (Signed)
Neuro status pt more confused today. Could follow simple commands on 12/29/17, but today could not follow simple commands. Encephalopathy worse. Precedex titrated down throughout day. Neo gtts increased to keep MAP > 65. Lactulose enema. Flexiseal placed. Liver biopsy today. Now DNR per mother decision.

## 2017-12-30 NOTE — Progress Notes (Signed)
eLink Physician-Brief Progress Note Patient Name: Amy Powers DOB: [birth year] MRN: [id number]   Date of Service  12/30/2017  HPI/Events of Note  Stable vitals and stable visual on cam care  eICU Interventions  No eMD interventions     Intervention Category Minor Interventions: Other:  Kalman ShanMurali Gerri Acre 12/30/2017, 7:43 PM

## 2017-12-30 DEATH — deceased

## 2017-12-31 LAB — RESPIRATORY PANEL BY PCR
Adenovirus: NOT DETECTED
BORDETELLA PERTUSSIS-RVPCR: NOT DETECTED
CHLAMYDOPHILA PNEUMONIAE-RVPPCR: NOT DETECTED
CORONAVIRUS 229E-RVPPCR: NOT DETECTED
CORONAVIRUS HKU1-RVPPCR: NOT DETECTED
CORONAVIRUS NL63-RVPPCR: NOT DETECTED
Coronavirus OC43: NOT DETECTED
Influenza A: NOT DETECTED
Influenza B: NOT DETECTED
MYCOPLASMA PNEUMONIAE-RVPPCR: NOT DETECTED
Metapneumovirus: NOT DETECTED
PARAINFLUENZA VIRUS 3-RVPPCR: NOT DETECTED
Parainfluenza Virus 1: NOT DETECTED
Parainfluenza Virus 2: NOT DETECTED
Parainfluenza Virus 4: NOT DETECTED
Respiratory Syncytial Virus: NOT DETECTED
Rhinovirus / Enterovirus: NOT DETECTED

## 2017-12-31 LAB — CBC WITH DIFFERENTIAL/PLATELET
BASOS PCT: 0 %
Basophils Absolute: 0.1 10*3/uL (ref 0.0–0.1)
Basophils Absolute: 0 10*3/uL (ref 0.0–0.1)
Basophils Relative: 0 %
EOS PCT: 6 %
EOS PCT: 7 %
Eosinophils Absolute: 1.8 10*3/uL — ABNORMAL HIGH (ref 0.0–0.7)
Eosinophils Absolute: 2.2 10*3/uL — ABNORMAL HIGH (ref 0.0–0.7)
HCT: 34 % — ABNORMAL LOW (ref 36.0–46.0)
HCT: 36 % (ref 36.0–46.0)
HEMOGLOBIN: 11.3 g/dL — AB (ref 12.0–15.0)
Hemoglobin: 10.8 g/dL — ABNORMAL LOW (ref 12.0–15.0)
LYMPHS ABS: 4.1 10*3/uL — AB (ref 0.7–4.0)
LYMPHS PCT: 14 %
LYMPHS PCT: 13 %
Lymphs Abs: 4.1 10*3/uL — ABNORMAL HIGH (ref 0.7–4.0)
MCH: 38.4 pg — AB (ref 26.0–34.0)
MCH: 37.5 pg — AB (ref 26.0–34.0)
MCHC: 31.8 g/dL (ref 30.0–36.0)
MCHC: 31.4 g/dL (ref 30.0–36.0)
MCV: 121 fL — ABNORMAL HIGH (ref 78.0–100.0)
MCV: 119.6 fL — AB (ref 78.0–100.0)
MONO ABS: 1.7 10*3/uL — AB (ref 0.1–1.0)
MONO ABS: 2.2 10*3/uL — AB (ref 0.1–1.0)
MONOS PCT: 7 %
Monocytes Relative: 6 %
NEUTROS PCT: 73 %
Neutro Abs: 20.8 10*3/uL — ABNORMAL HIGH (ref 1.7–7.7)
Neutro Abs: 23 10*3/uL — ABNORMAL HIGH (ref 1.7–7.7)
Neutrophils Relative %: 74 %
PLATELETS: 101 10*3/uL — AB (ref 150–400)
PLATELETS: 117 10*3/uL — AB (ref 150–400)
RBC: 2.81 MIL/uL — AB (ref 3.87–5.11)
RBC: 3.01 MIL/uL — AB (ref 3.87–5.11)
RDW: 14.9 % (ref 11.5–15.5)
RDW: 14.8 % (ref 11.5–15.5)
WBC: 28.4 10*3/uL — ABNORMAL HIGH (ref 4.0–10.5)
WBC: 31.5 10*3/uL — AB (ref 4.0–10.5)

## 2017-12-31 LAB — C DIFFICILE QUICK SCREEN W PCR REFLEX
C DIFFICILE (CDIFF) INTERP: NOT DETECTED
C DIFFICILE (CDIFF) TOXIN: NEGATIVE
C Diff antigen: NEGATIVE

## 2017-12-31 LAB — BASIC METABOLIC PANEL
Anion gap: 6 (ref 5–15)
BUN: 12 mg/dL (ref 6–20)
CHLORIDE: 117 mmol/L — AB (ref 101–111)
CO2: 23 mmol/L (ref 22–32)
Calcium: 7.2 mg/dL — ABNORMAL LOW (ref 8.9–10.3)
Creatinine, Ser: 0.58 mg/dL (ref 0.44–1.00)
GFR calc Af Amer: >60 mL/min (ref >60)
GFR calc non Af Amer: >60 mL/min (ref >60)
Glucose, Bld: 122 mg/dL — ABNORMAL HIGH (ref 65–99)
POTASSIUM: 3.1 mmol/L — AB (ref 3.5–5.1)
Sodium: 146 mmol/L — ABNORMAL HIGH (ref 135–145)

## 2017-12-31 LAB — LACTIC ACID, PLASMA
LACTIC ACID, VENOUS: 2.1 mmol/L — AB (ref 0.5–1.9)
LACTIC ACID, VENOUS: 2.8 mmol/L — AB (ref 0.5–1.9)

## 2017-12-31 LAB — GLUCOSE, CAPILLARY
GLUCOSE-CAPILLARY: 135 mg/dL — AB (ref 65–99)
GLUCOSE-CAPILLARY: 148 mg/dL — AB (ref 65–99)
Glucose-Capillary: 106 mg/dL — ABNORMAL HIGH (ref 65–99)
Glucose-Capillary: 109 mg/dL — ABNORMAL HIGH (ref 65–99)
Glucose-Capillary: 103 mg/dL — ABNORMAL HIGH (ref 65–99)

## 2017-12-31 LAB — LITHIUM LEVEL: LITHIUM LVL: 0.89 mmol/L (ref 0.60–1.20)

## 2017-12-31 LAB — VITAMIN B12: VITAMIN B 12: 1182 pg/mL — AB (ref 180–914)

## 2017-12-31 LAB — PROCALCITONIN: Procalcitonin: 0.48 ng/mL

## 2017-12-31 LAB — MAGNESIUM: Magnesium: 2 mg/dL (ref 1.7–2.4)

## 2017-12-31 LAB — PHOSPHORUS: Phosphorus: 2.4 mg/dL — ABNORMAL LOW (ref 2.5–4.6)

## 2017-12-31 LAB — COPPER, SERUM: Copper: 56 ug/dL — ABNORMAL LOW (ref 72–166)

## 2017-12-31 LAB — TSH: TSH: 3.007 u[IU]/mL (ref 0.350–4.500)

## 2017-12-31 MED ORDER — POTASSIUM CHLORIDE 20 MEQ/15ML (10%) PO SOLN
30.0000 meq | ORAL | Status: AC
  Administered 2017-12-31 (×2): 30 meq
  Filled 2017-12-31 (×2): qty 30

## 2017-12-31 MED ORDER — PIPERACILLIN-TAZOBACTAM 3.375 G IVPB
3.3750 g | Freq: Three times a day (TID) | INTRAVENOUS | Status: DC
  Administered 2017-12-31 – 2018-01-03 (×9): 3.375 g via INTRAVENOUS
  Filled 2017-12-31 (×9): qty 50

## 2017-12-31 NOTE — Progress Notes (Signed)
Pharmacy Antibiotic Note  Amy Powers is a 47 y.o. female admitted on 08/16/2018 with acute encephalopathy.  Pharmacy was initially consulted on admission to dose Zosyn and Vancomycin for pneumonia; completed a 7 day course of Zosyn on 3/1. Pharmacy has been consulted again 3/2 to resume Zosyn dosing for sepsis.  Today, 12/31/2017 Tmax 99.5 WBC up 31.5 Lactate 2.1 > 2.8  Plan: Zosyn 3.375gm IV q8h (4hr extended infusions) No further dosing adjustments needed, Pharmacy will sign off  Height: 5\' 5"  (165.1 cm) Weight: 190 lb 14.7 oz (86.6 kg) IBW/kg (Calculated) : 57  Temp (24hrs), Avg:98.6 F (37 C), Min:97.2 F (36.2 C), Max:99.6 F (37.6 C)  Recent Labs  Lab 12/25/17 0509  12/26/17 1238 12/28/17 0340 12/29/17 0455 12/29/17 2030 12/30/17 0500 12/31/17 0500 12/31/17 0719 12/31/17 0800 12/31/17 1300  WBC 19.8*   < >  --  19.1* 17.6*  --  22.2* 28.4*  --  31.5*  --   CREATININE 0.83   < >  --  0.77 0.73 0.61 0.67 0.58  --   --   --   LATICACIDVEN 1.9  --  2.3*  --   --   --   --   --  2.1*  --  2.8*   < > = values in this interval not displayed.    Estimated Creatinine Clearance: 95.4 mL/min (by C-G formula based on SCr of 0.58 mg/dL).    No Known Allergies  Antimicrobials this admission:  2/23 vancomycin >> 2/24 2/23 zosyn >> 3/1, resumed 3/2  Dose adjustments this admission:  Last admit  2/5 vanc 1250 x 1 then [facility] [place] of 13 drawn 1.5 hrs late, true trough ~ 16 on 12/10/17  Microbiology results:  2/22 peritoneal fluid>>NGF 2/21 Bcx>> NGF 2/24 MRSA PCR>>neg 2/24 BCx>> NGF 3/2 Respiratory panel: 3/2 C.diff  Thank you for allowing pharmacy to be a part of this patient's care.  Loralee PacasErin Tyrin Herbers, PharmD, BCPS Pager: [phone] 12/31/2017 4:25 PM

## 2017-12-31 NOTE — Progress Notes (Signed)
CRITICAL VALUE ALERT  Critical Value:  Lactic 2.8  Date & Time Notied:  12/31/2017 14:15 Provider Notified: Dr. Wallace CullensGray   Orders Received/Actions taken: new orders received

## 2017-12-31 NOTE — Progress Notes (Signed)
CRITICAL VALUE ALERT  Critical Value:  Lactic acid 2.1 Date & Time Notied:  12/31/2017 10:40  Provider Notified: Dr. Wallace CullensGray  Orders Received/Actions taken: no new orders received

## 2017-12-31 NOTE — Progress Notes (Signed)
[facility] ROUNDING NOTE   Subjective: S/p liver biopsy.  Remains on Prece dex and neo Obtunded, no response to verbal stimuli   Objective: Vital signs in last 24 hours: Temp:  [97.2 F (36.2 C)-99.6 F (37.6 C)] 98.1 F (36.7 C) (03/02 1110) Pulse Rate:  [58-86] 66 (03/02 1200) Resp:  [11-37] 21 (03/02 1200) BP: (87-143)/(60-92) 91/60 (03/02 1200) SpO2:  [89 %-100 %] 98 % (03/02 1200) Weight:  [190 lb 14.7 oz (86.6 kg)] 190 lb 14.7 oz (86.6 kg) (03/02 0500) Last BM Date: 12/31/17 General: NAD, obtunded, no response to stimuli Lungs: decreased breath sounds Heart: s1s2 Abdomen: soft, distended Ext: generalized edema, anasarca    Intake/Output from previous day: 03/01 0701 - 03/02 0700 In: 1902.2 [I.V.:1862.2; NG/GT:40] Out: 811 [Urine:810; Emesis/NG output:1] Intake/Output this shift: Total I/O In: 980.5 [I.V.:580.5; NG/GT:400] Out: 205 [Urine:205]   Lab Results: Recent Labs    12/30/17 0500 12/31/17 0500 12/31/17 0800  WBC 22.2* 28.4* 31.5*  HGB 12.1 10.8* 11.3*  PLT 170 101* 117*  MCV 119.4* 121.0* 119.6*   BMET Recent Labs    12/29/17 2030 12/30/17 0500 12/31/17 0500  NA 150* 150* 146*  K 3.1* 3.7 3.1*  CL 116* 118* 117*  CO2 26 25 23   GLUCOSE 162* 133* 122*  BUN 11 13 12   CREATININE 0.61 0.67 0.58  CALCIUM 8.2* 8.3* 7.2*   LFT Recent Labs    12/30/17 0500  PROT 4.9*  ALBUMIN 2.4*  AST 56*  ALT 22  ALKPHOS 124  BILITOT 3.7*  BILIDIR 1.4*  IBILI 2.3*   PT/INR Recent Labs    12/29/17 1044 12/30/17 0500  INR 2.50 1.84      Imaging/Other results: Ir Venogram Hepatic Wo Hemodynamic Evaluation  Result Date: 12/30/2017 INDICATION: History of cirrhosis, alcoholic hepatitis and ascites. Low ceruloplasmin and need for liver biopsy to evaluate for possible underlying Wilson's disease versus alcoholic liver disease. EXAM: 1. ULTRASOUND GUIDANCE FOR VASCULAR ACCESS OF THE RIGHT INTERNAL JUGULAR VEIN 2. HEPATIC VENOGRAPHY 3.  TRANSCATHETER BIOPSY OF THE LIVER MEDICATIONS: None. ANESTHESIA/SEDATION: No additional conscious sedation was administered other than titrated in a current IV Precedex drip. FLUOROSCOPY TIME:  Fluoroscopy Time: 3 minutes and 18 seconds. 86 mGy. CONTRAST:  10 mL Isovue-300 COMPLICATIONS: None immediate. PROCEDURE: Informed written consent was obtained from the patient's mother after a thorough discussion of the procedural risks, benefits and alternatives. All questions were addressed. Maximal Sterile Barrier Technique was utilized including caps, mask, sterile gowns, sterile gloves, sterile drape, hand hygiene and skin antiseptic. A timeout was performed prior to the initiation of the procedure. Ultrasound was utilized to confirm patency of the right internal jugular vein. Under ultrasound guidance, access of the vein was performed with a micropuncture set. Ultrasound image documentation was performed. After guidewire access, the venotomy was dilated. A guidewire was advanced under fluoroscopy into the inferior vena cava. A 10 French angled TIPS sheath was advanced over the wire into the inferior vena cava. The middle hepatic vein was catheterized with a 5 French catheter. Hepatic venography was performed through the catheter after it was advanced in the vein over a guidewire. The TIPS sheath was then advanced into the vein. The needle sheath from a transjugular liver biopsy set was then advanced over the wire through the 10 [place] sheath. This was advanced into the hepatic vein. The 19 gauge coaxial core biopsy device was then advanced through the sheath and biopsy within the right lobe of the liver performed through the wall  of the middle hepatic vein. Three separate passes were made with the 19 gauge core biopsy device yielding 3 separate core biopsy samples. These were split into 2 separate formalin containers. After the procedure all of the sheaths were removed and hemostasis obtained with manual compression.  FINDINGS: Hepatic venography shows a small caliber middle hepatic vein. There was ability to reflux into the portal venous system with visualization a right portal vein branch and some of the main portal vein. Transjugular biopsy yielded solid core biopsy samples of liver parenchyma. Two samples were submitted for surgical pathology. A single sample was submitted in a separate container for quantitative copper analysis. IMPRESSION: Successful transjugular biopsy of the liver via the middle hepatic vein. Samples of right lobe parenchyma were submitted for both surgical pathology and quantitative copper analysis. Electronically Signed   By: Irish Lack M.D.   On: 12/30/2017 16:57   Ir Transcatheter Bx  Result Date: 12/30/2017 INDICATION: History of cirrhosis, alcoholic hepatitis and ascites. Low ceruloplasmin and need for liver biopsy to evaluate for possible underlying Wilson's disease versus alcoholic liver disease. EXAM: 1. ULTRASOUND GUIDANCE FOR VASCULAR ACCESS OF THE RIGHT INTERNAL JUGULAR VEIN 2. HEPATIC VENOGRAPHY 3. TRANSCATHETER BIOPSY OF THE LIVER MEDICATIONS: None. ANESTHESIA/SEDATION: No additional conscious sedation was administered other than titrated in a current IV Precedex drip. FLUOROSCOPY TIME:  Fluoroscopy Time: 3 minutes and 18 seconds. 86 mGy. CONTRAST:  10 mL Isovue-300 COMPLICATIONS: None immediate. PROCEDURE: Informed written consent was obtained from the patient's mother after a thorough discussion of the procedural risks, benefits and alternatives. All questions were addressed. Maximal Sterile Barrier Technique was utilized including caps, mask, sterile gowns, sterile gloves, sterile drape, hand hygiene and skin antiseptic. A timeout was performed prior to the initiation of the procedure. Ultrasound was utilized to confirm patency of the right internal jugular vein. Under ultrasound guidance, access of the vein was performed with a micropuncture set. Ultrasound image documentation  was performed. After guidewire access, the venotomy was dilated. A guidewire was advanced under fluoroscopy into the inferior vena cava. A 10 French angled TIPS sheath was advanced over the wire into the inferior vena cava. The middle hepatic vein was catheterized with a 5 French catheter. Hepatic venography was performed through the catheter after it was advanced in the vein over a guidewire. The TIPS sheath was then advanced into the vein. The needle sheath from a transjugular liver biopsy set was then advanced over the wire through the 10 [place] sheath. This was advanced into the hepatic vein. The 19 gauge coaxial core biopsy device was then advanced through the sheath and biopsy within the right lobe of the liver performed through the wall of the middle hepatic vein. Three separate passes were made with the 19 gauge core biopsy device yielding 3 separate core biopsy samples. These were split into 2 separate formalin containers. After the procedure all of the sheaths were removed and hemostasis obtained with manual compression. FINDINGS: Hepatic venography shows a small caliber middle hepatic vein. There was ability to reflux into the portal venous system with visualization a right portal vein branch and some of the main portal vein. Transjugular biopsy yielded solid core biopsy samples of liver parenchyma. Two samples were submitted for surgical pathology. A single sample was submitted in a separate container for quantitative copper analysis. IMPRESSION: Successful transjugular biopsy of the liver via the middle hepatic vein. Samples of right lobe parenchyma were submitted for both surgical pathology and quantitative copper analysis. Electronically Signed   By:  Irish LackGlenn  Yamagata M.D.   On: 12/30/2017 16:57   Ir [place] Guide Vasc Access Right  Result Date: 12/30/2017 INDICATION: History of cirrhosis, alcoholic hepatitis and ascites. Low ceruloplasmin and need for liver biopsy to evaluate for possible underlying  Wilson's disease versus alcoholic liver disease. EXAM: 1. ULTRASOUND GUIDANCE FOR VASCULAR ACCESS OF THE RIGHT INTERNAL JUGULAR VEIN 2. HEPATIC VENOGRAPHY 3. TRANSCATHETER BIOPSY OF THE LIVER MEDICATIONS: None. ANESTHESIA/SEDATION: No additional conscious sedation was administered other than titrated in a current IV Precedex drip. FLUOROSCOPY TIME:  Fluoroscopy Time: 3 minutes and 18 seconds. 86 mGy. CONTRAST:  10 mL Isovue-300 COMPLICATIONS: None immediate. PROCEDURE: Informed written consent was obtained from the patient's mother after a thorough discussion of the procedural risks, benefits and alternatives. All questions were addressed. Maximal Sterile Barrier Technique was utilized including caps, mask, sterile gowns, sterile gloves, sterile drape, hand hygiene and skin antiseptic. A timeout was performed prior to the initiation of the procedure. Ultrasound was utilized to confirm patency of the right internal jugular vein. Under ultrasound guidance, access of the vein was performed with a micropuncture set. Ultrasound image documentation was performed. After guidewire access, the venotomy was dilated. A guidewire was advanced under fluoroscopy into the inferior vena cava. A 10 French angled TIPS sheath was advanced over the wire into the inferior vena cava. The middle hepatic vein was catheterized with a 5 French catheter. Hepatic venography was performed through the catheter after it was advanced in the vein over a guidewire. The TIPS sheath was then advanced into the vein. The needle sheath from a transjugular liver biopsy set was then advanced over the wire through the 10 [place] sheath. This was advanced into the hepatic vein. The 19 gauge coaxial core biopsy device was then advanced through the sheath and biopsy within the right lobe of the liver performed through the wall of the middle hepatic vein. Three separate passes were made with the 19 gauge core biopsy device yielding 3 separate core biopsy  samples. These were split into 2 separate formalin containers. After the procedure all of the sheaths were removed and hemostasis obtained with manual compression. FINDINGS: Hepatic venography shows a small caliber middle hepatic vein. There was ability to reflux into the portal venous system with visualization a right portal vein branch and some of the main portal vein. Transjugular biopsy yielded solid core biopsy samples of liver parenchyma. Two samples were submitted for surgical pathology. A single sample was submitted in a separate container for quantitative copper analysis. IMPRESSION: Successful transjugular biopsy of the liver via the middle hepatic vein. Samples of right lobe parenchyma were submitted for both surgical pathology and quantitative copper analysis. Electronically Signed   By: Irish LackGlenn  Yamagata M.D.   On: 12/30/2017 16:57      Assessment &Plan  46 yr F with ETOH abuse, cirrhosis decompensated, acute alcoholic hepatiits non responder to steroids (prednisolone) with altered mental status Border line low ceruloplasmin, s/p liver biopsy to exclude Wilson's disease I discussed with pathology, will be able to finish processing the tissue and all stains by Monday Continue supportive care Lactulose and Rifaximin    K. Scherry RanVeena Maddyx Vallie , MD [phone] Mon-Fri 8a-5p [phone] after 5p, weekends, holidays [facility]

## 2017-12-31 NOTE — Progress Notes (Signed)
[facility] AM LAB REPLACEMENT ONLY  The patient does apply for the [facility] Electrolyte [facility] Protocol based on the criteria listed below:   1. Is GFR >/= 40 ml/min? Yes.    Patient's GFR today is >60 2. Is urine output >/= 0.5 ml/kg/hr for the last 6 hours? Yes.   Patient's UOP is 1.3 ml/kg/hr 3. Is BUN < 60 mg/dL? Yes.    Patient's BUN today is 12 4. Abnormal electrolyte(s): K was 3.1 5. Ordered repletion with: per protocol 6. If a panic level lab has been reported, has the CCM MD in charge been notified? Yes.  .   Physician:  Dr. Mendel Corningamaswamy  Mylisa Brunson, Dixon BoosMaria Samson 12/31/2017 5:56 AM

## 2017-12-31 NOTE — Progress Notes (Signed)
PULMONARY / CRITICAL CARE MEDICINE   Name: Amy Powers MRN: [id number] DOB: 05/12/1971 PCP Copland, Gwenlyn Found, MD LOS 9 as of 12/31/2017     ADMISSION DATE:  01-15-18 CONSULTATION DATE:  12/31/2017   REFERRING MD:  [facility] hospital[facility]  CHIEF COMPLAINT:  Acute encephaloathy  HISTORY OF PRESENT ILLNESS:   History is provided by the mother and review of the chart.  Patient is unable to give history because of acute encephalopathy  47 year old female lifelong alcoholic and prior hx of bipolar.  Lost her job in December 2018 and then became depressed.  After that mother suspects patient has been drinking.  Patient lives alone and does not have any family members locally.  Closest family member his mother in [place] and a sibling in [place] and another sibling in [place].  There are no children or friends [place].  She has been divorced for the last 4 years.  Then approximately December 06, 2017 patient was admitted for ascites, anasarca and hepatic encephalopathy and mild rhabdo (CK 1700) according to the mother.  Discharge around December 16, 2017 and was doing well with improved mobility but using a walker.  But according to the mother patient ran out of lactulose because there was no refill on this from the hospital. ALso given prednisonolne at discharge but mom says pharmacy was out of this so not given.  Patient is also having worsening ascites and anasarca.  Finally seen by primary care physician 01/15/2018 and sent to the emergency room and patient admitted.  Since admission overall mom does not think anasarca has improved.  In addition patient has developed acute encephalopathy with agitated delirium.  This is now been going on for the last 2 days.  It is unchanged to the mother.  Also intermittent tachypnea.  Therefore critical care medicine has been consulted. MELD score 12/31/2017 is 16   Home pysch meds: lithium, latuda, lamictal  Mom is very astute about the consequences of cirrhosis  and has discussed care limitations with [place].  Subjective She is on 1 mcg of Precedex and a moderate dose of Neo-Synephrine at the time of my examination.  He mumbles constantly and incoherently and cannot be redirected.  She does not follow instructions.  She is having liquid stool.  She is not obviously having a cough.  VITAL SIGNS: BP 103/67   Pulse 66   Temp 98.1 F (36.7 C) (Axillary)   Resp (!) 21   Ht 5\' 5"  (1.651 m)   Wt 190 lb 14.7 oz (86.6 kg)   LMP  (LMP Unknown)   SpO2 97%   BMI 31.77 kg/m   HEMODYNAMICS: CVP:  [2 mmHg] 2 mmHg  VENTILATOR SETTINGS:    INTAKE / OUTPUT:  Intake/Output Summary (Last 24 hours) at 12/31/2017 0730 Last data filed at 12/31/2017 0500 Gross per 24 hour  Intake 1747.43 ml  Output 811 ml  Net 936.43 ml     EXAM  General: This is an acutely ill-appearing 47 year old female who is constantly mumbling and not at all interactive.   HEENT: Normocephalic atraumatic.  She now has scleral edema, sclera are icteric, mucous membranes are dry. Pulmonary: Surprisingly clear to auscultation no accessory use, no wheezes Cardiac: Regular rate and rhythm without murmur. Abdomen: Soft nontender no organomegaly Neuro/psych: Does not eye open to voice or noxious stimuli.  Pupils are equal at 7 mm.  Face is symmetric.  Moves all fours.   LABS  PULMONARY No results for input(s): PHART, PCO2ART, PO2ART, HCO3,  TCO2, O2SAT in the last 168 hours.  Invalid input(s): PCO2, PO2  CBC Recent Labs  Lab 12/29/17 0455 12/30/17 0500 12/31/17 0500  HGB 11.0* 12.1 10.8*  HCT 33.4* 38.7 34.0*  WBC 17.6* 22.2* 28.4*  PLT 188 170 101*    COAGULATION Recent Labs  Lab 12/28/17 0340 12/29/17 1044 12/30/17 0500  INR 2.92 2.50 1.84    CARDIAC   No results for input(s): TROPONINI in the last 168 hours. No results for input(s): PROBNP in the last 168 hours.   CHEMISTRY Recent Labs  Lab 12/28/17 0340  12/28/17 1748 12/29/17 0455 12/29/17 1529  12/29/17 2030 12/30/17 0500 12/31/17 0500  NA 147*  --   --  149*  --  150* 150* 146*  K 2.8*  --   --  2.3*  --  3.1* 3.7 3.1*  CL 115*  --   --  115*  --  116* 118* 117*  CO2 21*  --   --  26  --  26 25 23   GLUCOSE 144*  --   --  161*  --  162* 133* 122*  BUN 10  --   --  11  --  11 13 12   CREATININE 0.77  --   --  0.73  --  0.61 0.67 0.58  CALCIUM 8.0*  --   --  8.6*  --  8.2* 8.3* 7.2*  MG 1.9   < > 2.2 1.9 1.8  --  2.3 2.0  PHOS 3.0   < > 2.4* 1.8* 2.6  --  2.1* 2.4*   < > = values in this interval not displayed.   Estimated Creatinine Clearance: 95.4 mL/min (by C-G formula based on SCr of 0.58 mg/dL).   LIVER Recent Labs  Lab 12/26/17 1238 12/28/17 0340 12/29/17 1044 12/30/17 0500  AST 48* 36  --  56*  ALT 25 19  --  22  ALKPHOS 107 94  --  124  BILITOT 4.3* 4.6*  --  3.7*  PROT 4.7* 4.5*  --  4.9*  ALBUMIN 2.7* 2.5*  --  2.4*  INR  --  2.92 2.50 1.84     INFECTIOUS Recent Labs  Lab 12/25/17 0509 12/26/17 1238 12/27/17 1124 12/28/17 0340  LATICACIDVEN 1.9 2.3*  --   --   PROCALCITON  --  0.36 0.47 0.51     ENDOCRINE CBG (last 3)  Recent Labs    12/30/17 1912 12/30/17 2327 12/31/17 0343  GLUCAP 106* 106* 106*     IMAGING x48h  - image(s) personally visualized  -   highlighted in bold Ir Venogram Hepatic Wo Hemodynamic Evaluation  Result Date: 12/30/2017 INDICATION: History of cirrhosis, alcoholic hepatitis and ascites. Low ceruloplasmin and need for liver biopsy to evaluate for possible underlying Wilson's disease versus alcoholic liver disease. EXAM: 1. ULTRASOUND GUIDANCE FOR VASCULAR ACCESS OF THE RIGHT INTERNAL JUGULAR VEIN 2. HEPATIC VENOGRAPHY 3. TRANSCATHETER BIOPSY OF THE LIVER MEDICATIONS: None. ANESTHESIA/SEDATION: No additional conscious sedation was administered other than titrated in a current IV Precedex drip. FLUOROSCOPY TIME:  Fluoroscopy Time: 3 minutes and 18 seconds. 86 mGy. CONTRAST:  10 mL Isovue-300 COMPLICATIONS: None immediate.  PROCEDURE: Informed written consent was obtained from the patient's mother after a thorough discussion of the procedural risks, benefits and alternatives. All questions were addressed. Maximal Sterile Barrier Technique was utilized including caps, mask, sterile gowns, sterile gloves, sterile drape, hand hygiene and skin antiseptic. A timeout was performed prior to the initiation of the procedure. Ultrasound  was utilized to confirm patency of the right internal jugular vein. Under ultrasound guidance, access of the vein was performed with a micropuncture set. Ultrasound image documentation was performed. After guidewire access, the venotomy was dilated. A guidewire was advanced under fluoroscopy into the inferior vena cava. A 10 French angled TIPS sheath was advanced over the wire into the inferior vena cava. The middle hepatic vein was catheterized with a 5 French catheter. Hepatic venography was performed through the catheter after it was advanced in the vein over a guidewire. The TIPS sheath was then advanced into the vein. The needle sheath from a transjugular liver biopsy set was then advanced over the wire through the 10 [place] sheath. This was advanced into the hepatic vein. The 19 gauge coaxial core biopsy device was then advanced through the sheath and biopsy within the right lobe of the liver performed through the wall of the middle hepatic vein. Three separate passes were made with the 19 gauge core biopsy device yielding 3 separate core biopsy samples. These were split into 2 separate formalin containers. After the procedure all of the sheaths were removed and hemostasis obtained with manual compression. FINDINGS: Hepatic venography shows a small caliber middle hepatic vein. There was ability to reflux into the portal venous system with visualization a right portal vein branch and some of the main portal vein. Transjugular biopsy yielded solid core biopsy samples of liver parenchyma. Two samples were  submitted for surgical pathology. A single sample was submitted in a separate container for quantitative copper analysis. IMPRESSION: Successful transjugular biopsy of the liver via the middle hepatic vein. Samples of right lobe parenchyma were submitted for both surgical pathology and quantitative copper analysis. Electronically Signed   By: Irish Lack M.D.   On: 12/30/2017 16:57   Ir Transcatheter Bx  Result Date: 12/30/2017 INDICATION: History of cirrhosis, alcoholic hepatitis and ascites. Low ceruloplasmin and need for liver biopsy to evaluate for possible underlying Wilson's disease versus alcoholic liver disease. EXAM: 1. ULTRASOUND GUIDANCE FOR VASCULAR ACCESS OF THE RIGHT INTERNAL JUGULAR VEIN 2. HEPATIC VENOGRAPHY 3. TRANSCATHETER BIOPSY OF THE LIVER MEDICATIONS: None. ANESTHESIA/SEDATION: No additional conscious sedation was administered other than titrated in a current IV Precedex drip. FLUOROSCOPY TIME:  Fluoroscopy Time: 3 minutes and 18 seconds. 86 mGy. CONTRAST:  10 mL Isovue-300 COMPLICATIONS: None immediate. PROCEDURE: Informed written consent was obtained from the patient's mother after a thorough discussion of the procedural risks, benefits and alternatives. All questions were addressed. Maximal Sterile Barrier Technique was utilized including caps, mask, sterile gowns, sterile gloves, sterile drape, hand hygiene and skin antiseptic. A timeout was performed prior to the initiation of the procedure. Ultrasound was utilized to confirm patency of the right internal jugular vein. Under ultrasound guidance, access of the vein was performed with a micropuncture set. Ultrasound image documentation was performed. After guidewire access, the venotomy was dilated. A guidewire was advanced under fluoroscopy into the inferior vena cava. A 10 French angled TIPS sheath was advanced over the wire into the inferior vena cava. The middle hepatic vein was catheterized with a 5 French catheter. Hepatic  venography was performed through the catheter after it was advanced in the vein over a guidewire. The TIPS sheath was then advanced into the vein. The needle sheath from a transjugular liver biopsy set was then advanced over the wire through the 10 [place] sheath. This was advanced into the hepatic vein. The 19 gauge coaxial core biopsy device was then advanced through the sheath and biopsy  within the right lobe of the liver performed through the wall of the middle hepatic vein. Three separate passes were made with the 19 gauge core biopsy device yielding 3 separate core biopsy samples. These were split into 2 separate formalin containers. After the procedure all of the sheaths were removed and hemostasis obtained with manual compression. FINDINGS: Hepatic venography shows a small caliber middle hepatic vein. There was ability to reflux into the portal venous system with visualization a right portal vein branch and some of the main portal vein. Transjugular biopsy yielded solid core biopsy samples of liver parenchyma. Two samples were submitted for surgical pathology. A single sample was submitted in a separate container for quantitative copper analysis. IMPRESSION: Successful transjugular biopsy of the liver via the middle hepatic vein. Samples of right lobe parenchyma were submitted for both surgical pathology and quantitative copper analysis. Electronically Signed   By: Irish LackGlenn  Yamagata M.D.   On: 12/30/2017 16:57   Ir [place] Guide Vasc Access Right  Result Date: 12/30/2017 INDICATION: History of cirrhosis, alcoholic hepatitis and ascites. Low ceruloplasmin and need for liver biopsy to evaluate for possible underlying Wilson's disease versus alcoholic liver disease. EXAM: 1. ULTRASOUND GUIDANCE FOR VASCULAR ACCESS OF THE RIGHT INTERNAL JUGULAR VEIN 2. HEPATIC VENOGRAPHY 3. TRANSCATHETER BIOPSY OF THE LIVER MEDICATIONS: None. ANESTHESIA/SEDATION: No additional conscious sedation was administered other than titrated  in a current IV Precedex drip. FLUOROSCOPY TIME:  Fluoroscopy Time: 3 minutes and 18 seconds. 86 mGy. CONTRAST:  10 mL Isovue-300 COMPLICATIONS: None immediate. PROCEDURE: Informed written consent was obtained from the patient's mother after a thorough discussion of the procedural risks, benefits and alternatives. All questions were addressed. Maximal Sterile Barrier Technique was utilized including caps, mask, sterile gowns, sterile gloves, sterile drape, hand hygiene and skin antiseptic. A timeout was performed prior to the initiation of the procedure. Ultrasound was utilized to confirm patency of the right internal jugular vein. Under ultrasound guidance, access of the vein was performed with a micropuncture set. Ultrasound image documentation was performed. After guidewire access, the venotomy was dilated. A guidewire was advanced under fluoroscopy into the inferior vena cava. A 10 French angled TIPS sheath was advanced over the wire into the inferior vena cava. The middle hepatic vein was catheterized with a 5 French catheter. Hepatic venography was performed through the catheter after it was advanced in the vein over a guidewire. The TIPS sheath was then advanced into the vein. The needle sheath from a transjugular liver biopsy set was then advanced over the wire through the 10 [place] sheath. This was advanced into the hepatic vein. The 19 gauge coaxial core biopsy device was then advanced through the sheath and biopsy within the right lobe of the liver performed through the wall of the middle hepatic vein. Three separate passes were made with the 19 gauge core biopsy device yielding 3 separate core biopsy samples. These were split into 2 separate formalin containers. After the procedure all of the sheaths were removed and hemostasis obtained with manual compression. FINDINGS: Hepatic venography shows a small caliber middle hepatic vein. There was ability to reflux into the portal venous system with  visualization a right portal vein branch and some of the main portal vein. Transjugular biopsy yielded solid core biopsy samples of liver parenchyma. Two samples were submitted for surgical pathology. A single sample was submitted in a separate container for quantitative copper analysis. IMPRESSION: Successful transjugular biopsy of the liver via the middle hepatic vein. Samples of right lobe parenchyma were  submitted for both surgical pathology and quantitative copper analysis. Electronically Signed   By: Irish Lack M.D.   On: 12/30/2017 16:57    ASSESSMENT and PLAN  Encephalopathy acute Bipolar disease, chronic (HCC) Delirum -She does not appear to wax and wane and we are now 8 days into hospitalization making me suspect that the lion share of her encephalopathy is not still due to alcohol withdrawal.  I am concerned by her very high white count which is not associated with steroid administration, and I have ordered a pro-calcitonin being suspicious of systemic infection contributing to her altered mental status.  If the pro calcitonin is elevated I will cover her for both aspiration and spontaneous peritonitis.  She is to continue her Treatment for hepatic encephalopathy however her hepatic encephalopathy will be difficult to clear with her hypokalemia and this needs to be corrected.  She has  also had gastric bypass surgery and is therefore B12 deficient, and a B12 level is pending along with a TSH.  A liver biopsy was obtained yesterday to determine whether or not we might be dealing with Wilson's disease or even a component of hemochromatosis.  Cont rifaximin and lactulose  Try to wean off precedex.  Correct water balance  Precedex for RASS goal of 0  correct metabolic derangements Dc latuda  Cont Lamictal at 100mg /d Increase Lithium to 450mg  bid   Cirrhosis, alcoholic (HCC) Last drink late Jan 2019,. Dx of early-mid feb admission - alcoholic hepatis and given prednisolone which sshe  could not fill at discharge. 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  Plan D/c'd pred Cont rifaximin. Give lactulose via enema Awaiting liver bx Next intervention would be HD/pheresis; then starting Trientine  but end-point challenging. [facility] recommends definitive diagnostics (liver bx) and not empiric rx     HCAP (healthcare-associated pneumonia) -Chest chest x-ray personally reviewed from 2/25 demonstrates new right greater than left patchy infiltrate.  Plan Day 7/7 zosyn    Hypothyroidism Plan Synthroid daily    Electrolyte and fluid disorder: hypernatremia as well as hyperchloremia and hypophosphatemia  plan Adjust free water Replace phosphorus  Repeat Chem in am   Pulmonary edema Pulmonary edema with diffuse anasarca -No worsening of oxygen status, work of breathing appears improved -WOB unchanged.  Plan Wean oxygen for sats > 92%  Pressure injury of skin Plan Cont pressure relief interventions  Hypotension Drug-induced Plan Transduce CVP Neo for MAP > 65  Protein calorie malnutrition (HCC) Plan Cont tubefeeds   Medication management D/w mom. Mom aware that long term prognosis from cirrhosis poor and renal dysfunction means evern poorer prognosis Plan Cont current medical care No Code if arrests.     FAMILY    DNR at request of mother  3/1 My cct 45 min  Penny Pia, MD [facility] Pulmonary/Critical Care Pager # [phone] OR # [phone] if no answer

## 2018-01-01 ENCOUNTER — Inpatient Hospital Stay (HOSPITAL_COMMUNITY): Payer: BLUE CROSS/BLUE SHIELD

## 2018-01-01 LAB — BASIC METABOLIC PANEL
Anion gap: 7 (ref 5–15)
BUN: 16 mg/dL (ref 6–20)
CALCIUM: 7.4 mg/dL — AB (ref 8.9–10.3)
CO2: 21 mmol/L — AB (ref 22–32)
Chloride: 112 mmol/L — ABNORMAL HIGH (ref 101–111)
Creatinine, Ser: 0.88 mg/dL (ref 0.44–1.00)
GFR calc Af Amer: >60 mL/min (ref >60)
GFR calc non Af Amer: >60 mL/min (ref >60)
GLUCOSE: 150 mg/dL — AB (ref 65–99)
Potassium: 3.9 mmol/L (ref 3.5–5.1)
Sodium: 140 mmol/L (ref 135–145)

## 2018-01-01 LAB — GLUCOSE, CAPILLARY
GLUCOSE-CAPILLARY: 108 mg/dL — AB (ref 65–99)
GLUCOSE-CAPILLARY: 112 mg/dL — AB (ref 65–99)
Glucose-Capillary: 112 mg/dL — ABNORMAL HIGH (ref 65–99)
Glucose-Capillary: 118 mg/dL — ABNORMAL HIGH (ref 65–99)
Glucose-Capillary: 118 mg/dL — ABNORMAL HIGH (ref 65–99)
Glucose-Capillary: 137 mg/dL — ABNORMAL HIGH (ref 65–99)
Glucose-Capillary: 149 mg/dL — ABNORMAL HIGH (ref 65–99)

## 2018-01-01 LAB — PROCALCITONIN: Procalcitonin: 0.64 ng/mL

## 2018-01-01 LAB — MAGNESIUM: Magnesium: 2.1 mg/dL (ref 1.7–2.4)

## 2018-01-01 MED ORDER — POTASSIUM CHLORIDE CRYS ER 20 MEQ PO TBCR
40.0000 meq | EXTENDED_RELEASE_TABLET | Freq: Once | ORAL | Status: AC
  Administered 2018-01-01: 40 meq via ORAL
  Filled 2018-01-01: qty 2

## 2018-01-01 NOTE — Progress Notes (Signed)
PULMONARY / CRITICAL CARE MEDICINE   Name: Amy Powers MRN: [id number] DOB: 04-22-1971 PCP Copland, Gwenlyn Found, MD LOS 10 as of 01/01/2018     ADMISSION DATE:  12/26/2017 CONSULTATION DATE:  01/01/2018   REFERRING MD:  [facility] hospitalist  CHIEF COMPLAINT:  Acute encephaloathy  brief 47 year old female lifelong alcoholic and prior hx of bipolar.  Lost her job in December 2018 and then became depressed.  After that mother suspects patient has been drinking.  Patient lives alone and does not have any family members locally.  Closest family member his mother in [place] and a sibling in [place] and another sibling in [place].  There are no children or friends [place].  She has been divorced for the last 4 years.  Then approximately December 06, 2017 patient was admitted for ascites, anasarca and hepatic encephalopathy and mild rhabdo (CK 1700) according to the mother.  Discharge around December 16, 2017 and was doing well with improved mobility but using a walker.  But according to the mother patient ran out of lactulose because there was no refill on this from the hospital. ALso given prednisonolne at discharge but mom says pharmacy was out of this so not given.  Patient is also having worsening ascites and anasarca.  Finally seen by primary care physician December 22, 2017 and sent to the emergency room and patient admitted.  Since admission overall mom does not think anasarca has improved.  In addition patient has developed acute encephalopathy with agitated delirium.  This is now been going on for the last 2 days.  It is unchanged to the mother.  Also intermittent tachypnea.  Therefore critical care medicine has been consulted. MELD score 01/01/2018 is 16   Home pysch meds: lithium, latuda, lamictal  Mom is very astute about the consequences of cirrhosis and has discussed care limitations with [place].  EVENTS 3/1 - s.p liver bx to exclude wilsoon 12/31/17 - She is on 1 mcg of Precedex and a moderate dose  of Neo-Synephrine at the time of my examination.  He mumbles constantly and incoherently and cannot be redirected.  She does not follow instructions.  She is having liquid stool.  She is not obviously having a cough.    SUBJECTIVE/OVERNIGHT/INTERVAL HX 01/01/18 - mom at [facility]. STill on neo and precedex. Diarrhea with lactulose but still obtrunded. Liver bx results pending. Mom says she might consider terminal care but depends on liver bx results. Vomited and TF held. MELD down to 18 but still not waking up  VITAL SIGNS: BP 92/62   Pulse 92   Temp 99.1 F (37.3 C) (Oral)   Resp (!) 27   Ht 5\' 5"  (1.651 m)   Wt 87.9 kg (193 lb 12.6 oz)   LMP  (LMP Unknown)   SpO2 98%   BMI 32.25 kg/m   HEMODYNAMICS:    VENTILATOR SETTINGS:    INTAKE / OUTPUT:  Intake/Output Summary (Last 24 hours) at 01/01/2018 1135 Last data filed at 01/01/2018 1100 Gross per 24 hour  Intake 5705.1 ml  Output 3925 ml  Net 1780.1 ml     EXAM  General Appearance:    Looks criticall ill OBES  - yues  Head:    Normocephalic, without obvious abnormality, atraumatic  Eyes:    PERRL - yes, conjunctiva/corneas - jaundiced      Ears:    Normal external ear canals, both ears  Nose:   NG tube - yes panda  Throat:  ETT TUBE - yes , OG  tube - no  Neck:   Supple,  No enlargement/tenderness/nodules     Lungs:     Clear to auscultation bilaterally, mild tachypnea  Chest wall:    No deformity  Heart:    S1 and S2 normal, no murmur, CVP - no.  Pressors - yes  Abdomen:     Soft, no masses, no organomegaly but distended with ascites  Genitalia:    Not done  Rectal:   not done  Extremities:   Extremities- edema     Skin:   Intact in exposed areas .     Neurologic:   Sedation -  precedex-> RASS - -3 and +2 without precedex . Moves all 4s - yes. CAM-ICU - positive for delirium . Orientation - not oriented         LABS  PULMONARY No results for input(s): PHART, PCO2ART, PO2ART, HCO3, TCO2, O2SAT in the last 168  hours.  Invalid input(s): PCO2, PO2  CBC Recent Labs  Lab 12/30/17 0500 12/31/17 0500 12/31/17 0800  HGB 12.1 10.8* 11.3*  HCT 38.7 34.0* 36.0  WBC 22.2* 28.4* 31.5*  PLT 170 101* 117*    COAGULATION Recent Labs  Lab 12/28/17 0340 12/29/17 1044 12/30/17 0500  INR 2.92 2.50 1.84    CARDIAC   No results for input(s): TROPONINI in the last 168 hours. No results for input(s): PROBNP in the last 168 hours.   CHEMISTRY Recent Labs  Lab 12/28/17 1748 12/29/17 0455 12/29/17 1529 12/29/17 2030 12/30/17 0500 12/31/17 0500 01/01/18 0500  NA  --  149*  --  150* 150* 146* 140  K  --  2.3*  --  3.1* 3.7 3.1* 3.9  CL  --  115*  --  116* 118* 117* 112*  CO2  --  26  --  26 25 23  21*  GLUCOSE  --  161*  --  162* 133* 122* 150*  BUN  --  11  --  11 13 12 16   CREATININE  --  0.73  --  0.61 0.67 0.58 0.88  CALCIUM  --  8.6*  --  8.2* 8.3* 7.2* 7.4*  MG 2.2 1.9 1.8  --  2.3 2.0 2.1  PHOS 2.4* 1.8* 2.6  --  2.1* 2.4*  --    Estimated Creatinine Clearance: 87.5 mL/min (by C-G formula based on SCr of 0.88 mg/dL).   LIVER Recent Labs  Lab 12/26/17 1238 12/28/17 0340 12/29/17 1044 12/30/17 0500  AST 48* 36  --  56*  ALT 25 19  --  22  ALKPHOS 107 94  --  124  BILITOT 4.3* 4.6*  --  3.7*  PROT 4.7* 4.5*  --  4.9*  ALBUMIN 2.7* 2.5*  --  2.4*  INR  --  2.92 2.50 1.84     INFECTIOUS Recent Labs  Lab 12/26/17 1238  12/28/17 0340 12/31/17 0719 12/31/17 1300 01/01/18 0500  LATICACIDVEN 2.3*  --   --  2.1* 2.8*  --   PROCALCITON 0.36   < > 0.51 0.48  --  0.64   < > = values in this interval not displayed.     ENDOCRINE CBG (last 3)  Recent Labs    01/01/18 0343 01/01/18 0747 01/01/18 1059  GLUCAP 149* 112* 112*     IMAGING x48h  - image(s) personally visualized  -   highlighted in bold Ir Venogram Hepatic Wo Hemodynamic Evaluation  Result Date: 12/30/2017 INDICATION: History of cirrhosis, alcoholic hepatitis and ascites. Low ceruloplasmin and need  for  liver biopsy to evaluate for possible underlying Wilson's disease versus alcoholic liver disease. EXAM: 1. ULTRASOUND GUIDANCE FOR VASCULAR ACCESS OF THE RIGHT INTERNAL JUGULAR VEIN 2. HEPATIC VENOGRAPHY 3. TRANSCATHETER BIOPSY OF THE LIVER MEDICATIONS: None. ANESTHESIA/SEDATION: No additional conscious sedation was administered other than titrated in a current IV Precedex drip. FLUOROSCOPY TIME:  Fluoroscopy Time: 3 minutes and 18 seconds. 86 mGy. CONTRAST:  10 mL Isovue-300 COMPLICATIONS: None immediate. PROCEDURE: Informed written consent was obtained from the patient's mother after a thorough discussion of the procedural risks, benefits and alternatives. All questions were addressed. Maximal Sterile Barrier Technique was utilized including caps, mask, sterile gowns, sterile gloves, sterile drape, hand hygiene and skin antiseptic. A timeout was performed prior to the initiation of the procedure. Ultrasound was utilized to confirm patency of the right internal jugular vein. Under ultrasound guidance, access of the vein was performed with a micropuncture set. Ultrasound image documentation was performed. After guidewire access, the venotomy was dilated. A guidewire was advanced under fluoroscopy into the inferior vena cava. A 10 French angled TIPS sheath was advanced over the wire into the inferior vena cava. The middle hepatic vein was catheterized with a 5 French catheter. Hepatic venography was performed through the catheter after it was advanced in the vein over a guidewire. The TIPS sheath was then advanced into the vein. The needle sheath from a transjugular liver biopsy set was then advanced over the wire through the 10 [place] sheath. This was advanced into the hepatic vein. The 19 gauge coaxial core biopsy device was then advanced through the sheath and biopsy within the right lobe of the liver performed through the wall of the middle hepatic vein. Three separate passes were made with the 19 gauge core  biopsy device yielding 3 separate core biopsy samples. These were split into 2 separate formalin containers. After the procedure all of the sheaths were removed and hemostasis obtained with manual compression. FINDINGS: Hepatic venography shows a small caliber middle hepatic vein. There was ability to reflux into the portal venous system with visualization a right portal vein branch and some of the main portal vein. Transjugular biopsy yielded solid core biopsy samples of liver parenchyma. Two samples were submitted for surgical pathology. A single sample was submitted in a separate container for quantitative copper analysis. IMPRESSION: Successful transjugular biopsy of the liver via the middle hepatic vein. Samples of right lobe parenchyma were submitted for both surgical pathology and quantitative copper analysis. Electronically Signed   By: Irish Lack M.D.   On: 12/30/2017 16:57   Ir Transcatheter Bx  Result Date: 12/30/2017 INDICATION: History of cirrhosis, alcoholic hepatitis and ascites. Low ceruloplasmin and need for liver biopsy to evaluate for possible underlying Wilson's disease versus alcoholic liver disease. EXAM: 1. ULTRASOUND GUIDANCE FOR VASCULAR ACCESS OF THE RIGHT INTERNAL JUGULAR VEIN 2. HEPATIC VENOGRAPHY 3. TRANSCATHETER BIOPSY OF THE LIVER MEDICATIONS: None. ANESTHESIA/SEDATION: No additional conscious sedation was administered other than titrated in a current IV Precedex drip. FLUOROSCOPY TIME:  Fluoroscopy Time: 3 minutes and 18 seconds. 86 mGy. CONTRAST:  10 mL Isovue-300 COMPLICATIONS: None immediate. PROCEDURE: Informed written consent was obtained from the patient's mother after a thorough discussion of the procedural risks, benefits and alternatives. All questions were addressed. Maximal Sterile Barrier Technique was utilized including caps, mask, sterile gowns, sterile gloves, sterile drape, hand hygiene and skin antiseptic. A timeout was performed prior to the initiation of the  procedure. Ultrasound was utilized to confirm patency of the right internal jugular  vein. Under ultrasound guidance, access of the vein was performed with a micropuncture set. Ultrasound image documentation was performed. After guidewire access, the venotomy was dilated. A guidewire was advanced under fluoroscopy into the inferior vena cava. A 10 French angled TIPS sheath was advanced over the wire into the inferior vena cava. The middle hepatic vein was catheterized with a 5 French catheter. Hepatic venography was performed through the catheter after it was advanced in the vein over a guidewire. The TIPS sheath was then advanced into the vein. The needle sheath from a transjugular liver biopsy set was then advanced over the wire through the 10 [place] sheath. This was advanced into the hepatic vein. The 19 gauge coaxial core biopsy device was then advanced through the sheath and biopsy within the right lobe of the liver performed through the wall of the middle hepatic vein. Three separate passes were made with the 19 gauge core biopsy device yielding 3 separate core biopsy samples. These were split into 2 separate formalin containers. After the procedure all of the sheaths were removed and hemostasis obtained with manual compression. FINDINGS: Hepatic venography shows a small caliber middle hepatic vein. There was ability to reflux into the portal venous system with visualization a right portal vein branch and some of the main portal vein. Transjugular biopsy yielded solid core biopsy samples of liver parenchyma. Two samples were submitted for surgical pathology. A single sample was submitted in a separate container for quantitative copper analysis. IMPRESSION: Successful transjugular biopsy of the liver via the middle hepatic vein. Samples of right lobe parenchyma were submitted for both surgical pathology and quantitative copper analysis. Electronically Signed   By: Irish Lack M.D.   On: 12/30/2017 16:57    Ir US Guide Vasc Access Right  Result Date: 12/30/2017 INDICATION: History of cirrhosis, alcoholic hepatitis and ascites. Low ceruloplasmin and need for liver biopsy to evaluate for possible underlying Wilson's disease versus alcoholic liver disease. EXAM: 1. ULTRASOUND GUIDANCE FOR VASCULAR ACCESS OF THE RIGHT INTERNAL JUGULAR VEIN 2. HEPATIC VENOGRAPHY 3. TRANSCATHETER BIOPSY OF THE LIVER MEDICATIONS: None. ANESTHESIA/SEDATION: No additional conscious sedation was administered other than titrated in a current IV Precedex drip. FLUOROSCOPY TIME:  Fluoroscopy Time: 3 minutes and 18 seconds. 86 mGy. CONTRAST:  10 mL Isovue-300 COMPLICATIONS: None immediate. PROCEDURE: Informed written consent was obtained from the patient's mother after a thorough discussion of the procedural risks, benefits and alternatives. All questions were addressed. Maximal Sterile Barrier Technique was utilized including caps, mask, sterile gowns, sterile gloves, sterile drape, hand hygiene and skin antiseptic. A timeout was performed prior to the initiation of the procedure. Ultrasound was utilized to confirm patency of the right internal jugular vein. Under ultrasound guidance, access of the vein was performed with a micropuncture set. Ultrasound image documentation was performed. After guidewire access, the venotomy was dilated. A guidewire was advanced under fluoroscopy into the inferior vena cava. A 10 French angled TIPS sheath was advanced over the wire into the inferior vena cava. The middle hepatic vein was catheterized with a 5 French catheter. Hepatic venography was performed through the catheter after it was advanced in the vein over a guidewire. The TIPS sheath was then advanced into the vein. The needle sheath from a transjugular liver biopsy set was then advanced over the wire through the 10 [place] sheath. This was advanced into the hepatic vein. The 19 gauge coaxial core biopsy device was then advanced through the sheath  and biopsy within the right lobe of the liver  performed through the wall of the middle hepatic vein. Three separate passes were made with the 19 gauge core biopsy device yielding 3 separate core biopsy samples. These were split into 2 separate formalin containers. After the procedure all of the sheaths were removed and hemostasis obtained with manual compression. FINDINGS: Hepatic venography shows a small caliber middle hepatic vein. There was ability to reflux into the portal venous system with visualization a right portal vein branch and some of the main portal vein. Transjugular biopsy yielded solid core biopsy samples of liver parenchyma. Two samples were submitted for surgical pathology. A single sample was submitted in a separate container for quantitative copper analysis. IMPRESSION: Successful transjugular biopsy of the liver via the middle hepatic vein. Samples of right lobe parenchyma were submitted for both surgical pathology and quantitative copper analysis. Electronically Signed   By: Irish Lack M.D.   On: 12/30/2017 16:57   Dg Chest Port 1 View  Result Date: 01/01/2018 CLINICAL DATA:  47 year old female with cirrhosis, alcoholic hepatitis, ascites. Recent transjugular liver biopsy. Recent hypoxia, pulmonary edema. EXAM: PORTABLE CHEST 1 VIEW COMPARISON:  Chest radiographs 12/27/2017 and earlier. FINDINGS: Portable AP semi upright view at 0435 hours. Left upper extremity PICC line remains in place. An enteric tube now is in place and is looped at the level of the proximal stomach in the epigastrium. Stable superimposed upper abdominal surgical clips. Substantially improved but not completely resolved bilateral perihilar indistinct and confluent pulmonary opacity. Patchy residual bilateral opacity. No pneumothorax, pleural effusion or consolidation. Mediastinal contours remain normal. Paucity of bowel gas in the upper abdomen. IMPRESSION: 1. Enteric feeding tube placed, looped at the level of the  proximal stomach. This should be advanced if post pyloric placement is desired. 2. Substantially improved pulmonary edema since 12/27/2017. Mild residual bilateral perihilar opacity. 3. No new cardiopulmonary abnormality. Electronically Signed   By: Odessa Fleming M.D.   On: 01/01/2018 06:52    ASSESSMENT and PLAN Principal Problem:   Medication management Active Problems:   Encephalopathy acute   Cirrhosis, alcoholic (HCC)   Hypotension   Bipolar disease, chronic (HCC)   Hypothyroidism   HCAP (healthcare-associated pneumonia)   Pressure injury of skin   Electrolyte and fluid disorder   Pulmonary edema   Protein calorie malnutrition (HCC)   Shock circulatory (HCC)   Encephalopathy acute MELD improved . Making stool with lactulose but no improvement with encephalopathy  Plan Continue predcedex   Cirrhosis, alcoholic (HCC) Await liver bx If wilson mom might pursue chelation If etoh on bx -> then she will considedr terminal care  Continue lactulose and xifaxan   HCAP (healthcare-associated pneumonia) Completed zosyn   Antibiotics Given (last 72 hours)    Date/Time Action Medication Dose Rate   12/29/17 1411 New Bag/Given   piperacillin-tazobactam (ZOSYN) IVPB 3.375 g 3.375 g 12.5 mL/hr   12/29/17 1536 Given   rifaximin (XIFAXAN) tablet 400 mg 400 mg    12/29/17 2116 New Bag/Given   piperacillin-tazobactam (ZOSYN) IVPB 3.375 g 3.375 g 12.5 mL/hr   12/29/17 2119 Given   rifaximin (XIFAXAN) tablet 400 mg 400 mg    12/30/17 0641 New Bag/Given   piperacillin-tazobactam (ZOSYN) IVPB 3.375 g 3.375 g 12.5 mL/hr   12/30/17 1032 Given   rifaximin (XIFAXAN) tablet 400 mg 400 mg    12/30/17 1824 Given  [pt in procedure]   rifaximin (XIFAXAN) tablet 400 mg 400 mg    12/30/17 2208 Given   rifaximin (XIFAXAN) tablet 400 mg 400 mg  12/31/17 1045 Given   rifaximin (XIFAXAN) tablet 400 mg 400 mg    12/31/17 1414 New Bag/Given   piperacillin-tazobactam (ZOSYN) IVPB 3.375 g 3.375 g  12.5 mL/hr   12/31/17 1657 Given   rifaximin (XIFAXAN) tablet 400 mg 400 mg    12/31/17 2159 Given   rifaximin (XIFAXAN) tablet 400 mg 400 mg    01/01/18 0017 New Bag/Given   piperacillin-tazobactam (ZOSYN) IVPB 3.375 g 3.375 g 12.5 mL/hr   01/01/18 0743 New Bag/Given   piperacillin-tazobactam (ZOSYN) IVPB 3.375 g 3.375 g 12.5 mL/hr   01/01/18 0954 Given   rifaximin (XIFAXAN) tablet 400 mg 400 mg         Medication management D/w mom. Mom aware that long term prognosis from cirrhosis poor and renal dysfunction means evern poorer prognosis Plan Full medical scope of care.  Ventilator Short-term okay No CPR or defibrillation  Hypothyroidism Plan Continue Synthroid supplementation   Bipolar disease, chronic (HCC) She is been seen by psychiatry, very much appreciate their recommendations. Plan Cont  lithium 300 mg twice daily Lamictal  100 mg every at bedtime Reduce  Latuda to 40 mg If no improvement we can discontinue Latuda and increase lithium to 450 twice daily     Metabolic acidosis Hyperchloremic metabolic non-anion gap acidosis; also likely component of bicarbonate loss with stool Plan Free water replacement We will consider bicarbonate replacement orally if acid-base continues to worsen  Pulmonary edema Pulmonary edema with diffuse anasarca -Has been getting aggressive diuresis now almost 4 L net negative -No worsening of oxygen status, work of breathing appears improved Plan Continue current scheduled diuretics Continue strict intake and output Repeat x-ray in a.m.   Pressure injury of skin Plan Continue pressure relief interventions  Hypotension Drug-induced Plan Continue Neo-Synephrine as needed via PICC line  Protein calorie malnutrition (HCC) vomited  Plan Hold tubefeeds Get KUB  Shock circulatory (HCC) Continue neo     The patient is critically ill with multiple organ systems failure and requires high complexity decision making  for assessment and support, frequent evaluation and titration of therapies, application of advanced monitoring technologies and extensive interpretation of multiple databases.   Critical Care Time devoted to patient care services described in this note is  30  Minutes. This time reflects time of care of this signee Dr Kalman ShanMurali Niyonna Betsill. This critical care time does not reflect procedure time, or teaching time or supervisory time of PA/NP/Med student/Med Resident etc but could involve care discussion time    Dr. Kalman ShanMurali Shavonta Gossen, M.D., [facility].C.P Pulmonary and Critical Care Medicine Staff Physician [facility] Pager: [phone], If no answer or between  15:00h - 7:00h: call [phone]  01/01/2018 11:52 AM

## 2018-01-01 NOTE — Assessment & Plan Note (Signed)
Continue neo

## 2018-01-01 NOTE — Progress Notes (Signed)
eLink Physician-Brief Progress Note Patient Name: Amy Powers DOB: January 03, 1971 MRN: [id number]   Date of Service  01/01/2018  HPI/Events of Note  Admitted for HE/Sepsis in context of cirrhosis. Fever and rising WBC . Already on adequate antibiotics. BP reasonable and on phenylephrine.  Persistently elevated lactate (liver disease)  eICU Interventions  Cultures ordered and K replaced.      Intervention Category Intermediate Interventions: Other: Minor Interventions: Electrolytes abnormality - evaluation and management  Arshia Spellman 01/01/2018, 12:21 AM

## 2018-01-02 ENCOUNTER — Ambulatory Visit: Payer: Self-pay | Admitting: Family Medicine

## 2018-01-02 ENCOUNTER — Inpatient Hospital Stay (HOSPITAL_COMMUNITY): Payer: BLUE CROSS/BLUE SHIELD

## 2018-01-02 LAB — BLOOD CULTURE ID PANEL (REFLEXED)
Acinetobacter baumannii: NOT DETECTED
CANDIDA ALBICANS: NOT DETECTED
CANDIDA TROPICALIS: NOT DETECTED
Candida glabrata: NOT DETECTED
Candida krusei: NOT DETECTED
Candida parapsilosis: NOT DETECTED
Carbapenem resistance: NOT DETECTED
ENTEROBACTER CLOACAE COMPLEX: NOT DETECTED
ENTEROCOCCUS SPECIES: NOT DETECTED
Enterobacteriaceae species: DETECTED — AB
Escherichia coli: NOT DETECTED
HAEMOPHILUS INFLUENZAE: NOT DETECTED
Klebsiella oxytoca: NOT DETECTED
Klebsiella pneumoniae: NOT DETECTED
Listeria monocytogenes: NOT DETECTED
METHICILLIN RESISTANCE: DETECTED — AB
NEISSERIA MENINGITIDIS: NOT DETECTED
Proteus species: NOT DETECTED
Pseudomonas aeruginosa: NOT DETECTED
STREPTOCOCCUS AGALACTIAE: NOT DETECTED
STREPTOCOCCUS PYOGENES: NOT DETECTED
STREPTOCOCCUS SPECIES: NOT DETECTED
Serratia marcescens: NOT DETECTED
Staphylococcus aureus (BCID): NOT DETECTED
Staphylococcus species: DETECTED — AB
Streptococcus pneumoniae: NOT DETECTED

## 2018-01-02 LAB — GLUCOSE, CAPILLARY
GLUCOSE-CAPILLARY: 115 mg/dL — AB (ref 65–99)
GLUCOSE-CAPILLARY: 100 mg/dL — AB (ref 65–99)
GLUCOSE-CAPILLARY: 98 mg/dL (ref 65–99)
Glucose-Capillary: 108 mg/dL — ABNORMAL HIGH (ref 65–99)
Glucose-Capillary: 116 mg/dL — ABNORMAL HIGH (ref 65–99)

## 2018-01-02 LAB — PROCALCITONIN: PROCALCITONIN: 1.15 ng/mL

## 2018-01-02 MED ORDER — VANCOMYCIN HCL IN DEXTROSE 750-5 MG/150ML-% IV SOLN
750.0000 mg | Freq: Two times a day (BID) | INTRAVENOUS | Status: DC
  Administered 2018-01-02: 750 mg via INTRAVENOUS
  Filled 2018-01-02 (×2): qty 150

## 2018-01-02 MED ORDER — VANCOMYCIN HCL 10 G IV SOLR
1500.0000 mg | Freq: Once | INTRAVENOUS | Status: AC
  Administered 2018-01-02: 1500 mg via INTRAVENOUS
  Filled 2018-01-02: qty 1500

## 2018-01-02 NOTE — Progress Notes (Signed)
PHARMACY - PHYSICIAN COMMUNICATION CRITICAL VALUE ALERT - BLOOD CULTURE IDENTIFICATION (BCID)  Amy Powers is an 47 y.o. female [facility] with a chief complaint of sepsis.  Assessment:  Micro lab called earlier this morning to report 1/2 bcx sets with GPC in clusters but now reports BOTH sets have GPC in clusters  Name of physician (or Provider) Contacted: Dr. Isaiah SergeMannam  Current antibiotics: zosyn  Changes to prescribed antibiotics recommended: Add vancomycin to abx regimen  Results for orders placed or performed during the hospital encounter of 12/23/2017  Blood Culture ID Panel (Reflexed) (Collected: 01/01/2018 12:56 AM)  Result Value Ref Range   Enterococcus species NOT DETECTED NOT DETECTED   Listeria monocytogenes NOT DETECTED NOT DETECTED   Staphylococcus species DETECTED (A) NOT DETECTED   Staphylococcus aureus NOT DETECTED NOT DETECTED   Methicillin resistance DETECTED (A) NOT DETECTED   Streptococcus species NOT DETECTED NOT DETECTED   Streptococcus agalactiae NOT DETECTED NOT DETECTED   Streptococcus pneumoniae NOT DETECTED NOT DETECTED   Streptococcus pyogenes NOT DETECTED NOT DETECTED   Acinetobacter baumannii NOT DETECTED NOT DETECTED   Enterobacteriaceae species DETECTED (A) NOT DETECTED   Enterobacter cloacae complex NOT DETECTED NOT DETECTED   Escherichia coli NOT DETECTED NOT DETECTED   Klebsiella oxytoca NOT DETECTED NOT DETECTED   Klebsiella pneumoniae NOT DETECTED NOT DETECTED   Proteus species NOT DETECTED NOT DETECTED   Serratia marcescens NOT DETECTED NOT DETECTED   Carbapenem resistance NOT DETECTED NOT DETECTED   Haemophilus influenzae NOT DETECTED NOT DETECTED   Neisseria meningitidis NOT DETECTED NOT DETECTED   Pseudomonas aeruginosa NOT DETECTED NOT DETECTED   Candida albicans NOT DETECTED NOT DETECTED   Candida glabrata NOT DETECTED NOT DETECTED   Candida krusei NOT DETECTED NOT DETECTED   Candida parapsilosis NOT DETECTED  NOT DETECTED   Candida tropicalis NOT DETECTED NOT DETECTED    [name], [name] 01/02/2018  9:52 AM

## 2018-01-02 NOTE — Progress Notes (Signed)
PHARMACY - PHYSICIAN COMMUNICATION CRITICAL VALUE ALERT - BLOOD CULTURE IDENTIFICATION (BCID)  Amy Powers is an 47 y.o. female who [facility] with a chief complaint of sepsis  Assessment:  Patient with BCID result but no specific organism isolated. (include suspected source if known)  Name of physician (or Provider) Contacted: Dr. Darrick Pennaeterding  Current antibiotics: Zosyn 3.375g IV Q8H infused over 4hrs.  Changes to prescribed antibiotics recommended:  Recommendations accepted by provider  No change at time time, have rounding team re-evaluate in AM and await final cultures.  Results for orders placed or performed during the hospital encounter of Jan 29, 2018  Blood Culture ID Panel (Reflexed) (Collected: 01/01/2018 12:56 AM)  Result Value Ref Range   Enterococcus species NOT DETECTED NOT DETECTED   Listeria monocytogenes NOT DETECTED NOT DETECTED   Staphylococcus species DETECTED (A) NOT DETECTED   Staphylococcus aureus NOT DETECTED NOT DETECTED   Methicillin resistance DETECTED (A) NOT DETECTED   Streptococcus species NOT DETECTED NOT DETECTED   Streptococcus agalactiae NOT DETECTED NOT DETECTED   Streptococcus pneumoniae NOT DETECTED NOT DETECTED   Streptococcus pyogenes NOT DETECTED NOT DETECTED   Acinetobacter baumannii NOT DETECTED NOT DETECTED   Enterobacteriaceae species DETECTED (A) NOT DETECTED   Enterobacter cloacae complex NOT DETECTED NOT DETECTED   Escherichia coli NOT DETECTED NOT DETECTED   Klebsiella oxytoca NOT DETECTED NOT DETECTED   Klebsiella pneumoniae NOT DETECTED NOT DETECTED   Proteus species NOT DETECTED NOT DETECTED   Serratia marcescens NOT DETECTED NOT DETECTED   Carbapenem resistance NOT DETECTED NOT DETECTED   Haemophilus influenzae NOT DETECTED NOT DETECTED   Neisseria meningitidis NOT DETECTED NOT DETECTED   Pseudomonas aeruginosa NOT DETECTED NOT DETECTED   Candida albicans NOT DETECTED NOT DETECTED   Candida glabrata NOT  DETECTED NOT DETECTED   Candida krusei NOT DETECTED NOT DETECTED   Candida parapsilosis NOT DETECTED NOT DETECTED   Candida tropicalis NOT DETECTED NOT DETECTED    [name], [name] 01/02/2018  5:32 AM

## 2018-01-02 NOTE — Progress Notes (Signed)
Per CCM MD Panda tube currently placed in patient not in in proper place in patient which could be causing emesis issue. This RN tried to place new NG tube twice. Another RN tried to place NG tube twice. Each time was unsuccessful and patient vomited. MD notified of situation.

## 2018-01-02 NOTE — Progress Notes (Signed)
Patient vomited after receiving medications, fluid bolus and lactulose enema within a short period of time. Staff were in the room and able to turn pt and suction her immediately. We monitored for signs aspiration but saw none. No coughing notice. Sats remained 100%. Stopped tube feed and called Elink advised this is the second time today she has vomited. Elink advised they would review meds and change as many to IV as possible.

## 2018-01-02 NOTE — Progress Notes (Signed)
Nutrition Follow-up  DOCUMENTATION CODES:   Obesity unspecified  INTERVENTION:  - When TF able to be re-started, restart Osmolite 1.5 @ 50 mL/hr with 30 mL Prostat once/day and 200 mL free water every 4 hours. - Dependent on ability to restart TF and GOC, goal rate is for Osmolite 1.5 @ 60 mL/hr.    NUTRITION DIAGNOSIS:   Inadequate oral intake related to inability to eat as evidenced by NPO status. -ongoing  GOAL:   Patient will meet greater than or equal to 90% of their needs -unmet with TF currently on hold.   MONITOR:   TF tolerance, Weight trends, Labs, Skin, I & O's  ASSESSMENT:   47 year old female lifelong alcoholic and prior hx of bipolar.  Lost her job in December 2018 and then became depressed.  After that mother suspects patient has been drinking.  Patient lives alone and does not have any family members locally.  Current weight back to weight from 2/23; weight is trending back up. Pt with small bore NGT and order in place for Osmolite 1.5 @ 50 mL/hr with 30 mL Prostat once/day and 200 mL free water every 4 hours. This regimen provides 1900 kcal, 90 grams of protein, 2114 mL free water.   Per Dr. Shirlee MoreMannam's note this AM: awaiting liver biopsy results, hopeful to wean Precedex as tolerated, and TF currently on hold d/t emesis. Per review of flow sheet, it appears that emesis occurred around midnight last night.  Medications reviewed; 1 mg folic acid per NGT/day, 40 mg IV Lasix BID, 300 ml rectal lactulose BID, 100 mcg Synthroid per NGT/day, 100 mg Aldactone per NGT/day, 100 mg IV thiamine/day.  Labs reviewed; CBG: 108 mg/dL this AM.  IVF: D5 @ 65 mL/hr (265 kcal).     Diet Order:  Fall precautions Diet NPO time specified  EDUCATION NEEDS:   No education needs have been identified at this time  Skin:  Skin Assessment: Skin Integrity Issues: Skin Integrity Issues:: Stage II Stage II: stage 2 bilateral buttocks  Last BM:  3/3  Height:   Ht Readings from Last  1 Encounters:  12/26/17 5\' 5"  (1.651 m)    Weight:   Wt Readings from Last 1 Encounters:  01/02/18 202 lb 13.2 oz (92 kg)    Ideal Body Weight:  56.82 kg  BMI:  Body mass index is 33.75 kg/m.  Estimated Nutritional Needs:   Kcal:  [id number] (25-27 kcal/kg)  Protein:  105-122 grams (1.2-1.4 grams/kg)  Fluid:  >/= 2 L/day       Trenton GammonJessica Jeziel Hoffmann, MS, RD, LDN, [facility] Inpatient Clinical Dietitian Pager # [phone] After hours/weekend pager # [phone]

## 2018-01-02 NOTE — Progress Notes (Signed)
PULMONARY / CRITICAL CARE MEDICINE   Name: Amy Powers MRN: [id number] DOB: 1971/03/16 PCP Copland, Gwenlyn Found, MD LOS 11 as of 01/02/2018     ADMISSION DATE:  12/04/2017 CONSULTATION DATE:  01/02/2018   REFERRING MD:  [facility] hospitalist  CHIEF COMPLAINT:  Acute encephaloathy  brief 47 year old female lifelong alcoholic and prior hx of bipolar.  Lost her job in December 2018 and then became depressed.  After that mother suspects patient has been drinking.  Patient lives alone and does not have any family members locally.  Closest family member his mother in [place] and a sibling in [place] and another sibling in [place].  There are no children or friends [place].  She has been divorced for the last 4 years.  Then approximately December 06, 2017 patient was admitted for ascites, anasarca and hepatic encephalopathy and mild rhabdo (CK 1700) according to the mother.  Discharge around December 16, 2017 and was doing well with improved mobility but using a walker.  But according to the mother patient ran out of lactulose because there was no refill on this from the hospital. ALso given prednisonolne at discharge but mom says pharmacy was out of this so not given.  Patient is also having worsening ascites and anasarca.  Finally seen by primary care physician December 22, 2017 and sent to the emergency room and patient admitted.  Since admission overall mom does not think anasarca has improved.  In addition patient has developed acute encephalopathy with agitated delirium.  This is now been going on for the last 2 days.  It is unchanged to the mother.  Also intermittent tachypnea.  Therefore critical care medicine has been consulted. MELD score 01/02/2018 is 16   Home pysch meds: lithium, latuda, lamictal  Mom is very astute about the consequences of cirrhosis and has discussed care limitations with [place].  EVENTS 3/1 - s.p liver bx to exclude wilsoon  SUBJECTIVE/OVERNIGHT/INTERVAL HX Continues on Neo and  Precedex.  Had a episode of emesis.  Tube feeding held. Blood culture showing gram-negative rods.  She has been started on Zosyn.  VITAL SIGNS: BP (!) 96/57 (BP Location: Right Arm)   Pulse 85   Temp (!) 101.7 F (38.7 C) (Axillary)   Resp (!) 26   Ht 5\' 5"  (1.651 m)   Wt 202 lb 13.2 oz (92 kg)   LMP  (LMP Unknown)   SpO2 100%   BMI 33.75 kg/m   HEMODYNAMICS:    VENTILATOR SETTINGS:    INTAKE / OUTPUT:  Intake/Output Summary (Last 24 hours) at 01/02/2018 0851 Last data filed at 01/02/2018 0534 Gross per 24 hour  Intake 5498.82 ml  Output 2720 ml  Net 2778.82 ml    EXAM Gen:      No acute distress HEENT:  EOMI, scleral edema, icterus Neck:     No masses; no thyromegaly Lungs:    Clear to auscultation bilaterally; normal respiratory effort CV:         Regular rate and rhythm; no murmurs Abd:      + bowel sounds; soft, non-tender; no palpable masses, no distension Ext:    Anasarca Skin:      Warm and dry; no rash Neuro: Somnolent     LABS  PULMONARY No results for input(s): PHART, PCO2ART, PO2ART, HCO3, TCO2, O2SAT in the last 168 hours.  Invalid input(s): PCO2, PO2  CBC Recent Labs  Lab 12/30/17 0500 12/31/17 0500 12/31/17 0800  HGB 12.1 10.8* 11.3*  HCT 38.7 34.0* 36.0  WBC 22.2* 28.4* 31.5*  PLT 170 101* 117*    COAGULATION Recent Labs  Lab 12/28/17 0340 12/29/17 1044 12/30/17 0500  INR 2.92 2.50 1.84    CARDIAC   No results for input(s): TROPONINI in the last 168 hours. No results for input(s): PROBNP in the last 168 hours.   CHEMISTRY Recent Labs  Lab 12/28/17 1748 12/29/17 0455 12/29/17 1529 12/29/17 2030 12/30/17 0500 12/31/17 0500 01/01/18 0500  NA  --  149*  --  150* 150* 146* 140  K  --  2.3*  --  3.1* 3.7 3.1* 3.9  CL  --  115*  --  116* 118* 117* 112*  CO2  --  26  --  26 25 23  21*  GLUCOSE  --  161*  --  162* 133* 122* 150*  BUN  --  11  --  11 13 12 16   CREATININE  --  0.73  --  0.61 0.67 0.58 0.88  CALCIUM  --   8.6*  --  8.2* 8.3* 7.2* 7.4*  MG 2.2 1.9 1.8  --  2.3 2.0 2.1  PHOS 2.4* 1.8* 2.6  --  2.1* 2.4*  --    Estimated Creatinine Clearance: 89.5 mL/min (by C-G formula based on SCr of 0.88 mg/dL).   LIVER Recent Labs  Lab 12/26/17 1238 12/28/17 0340 12/29/17 1044 12/30/17 0500  AST 48* 36  --  56*  ALT 25 19  --  22  ALKPHOS 107 94  --  124  BILITOT 4.3* 4.6*  --  3.7*  PROT 4.7* 4.5*  --  4.9*  ALBUMIN 2.7* 2.5*  --  2.4*  INR  --  2.92 2.50 1.84     INFECTIOUS Recent Labs  Lab 12/26/17 1238  12/31/17 0719 12/31/17 1300 01/01/18 0500 01/02/18 0500  LATICACIDVEN 2.3*  --  2.1* 2.8*  --   --   PROCALCITON 0.36   < > 0.48  --  0.64 1.15   < > = values in this interval not displayed.     ENDOCRINE CBG (last 3)  Recent Labs    01/01/18 1919 01/01/18 2332 01/02/18 0338  GLUCAP 118* 108* 108*     IMAGING x48h Abdominal x-ray 01/01/18-feeding tube in stomach with the distal tip pointed upwards Chest x-ray 01/01/18-improving pulmonary edema I have reviewed the images personally.   ASSESSMENT and PLAN 47 y/o with encephalopathy secondary to cirrhosis from alcohol vs wilsons disease  Cirrhosis Continue supportive care Rifaximin, lactulose Awaiting liver biopsy results. Discussed with mom.  Consider comfort care if this looks like just alcohol related  Emesis Holding tube feeds.  Needs feeding tube to be repositioned.  HCAP, sepsis New gram-negative rod bacteremia Restarted on Zosyn.  Awaiting final cultures Wean down neo  Encephalopathy On precedex, wean down as tolerated  Hypothyroidism On synthyroid  Continuing goals of care discussion with mother. Prognosis is poor  The patient is critically ill with multiple organ system failure and requires high complexity decision making for assessment and support, frequent evaluation and titration of therapies, advanced monitoring, review of radiographic studies and interpretation of complex data.   Critical  Care Time devoted to patient care services, exclusive of separately billable procedures, described in this note is 35 minutes.   Chilton GreathousePraveen Crystall Donaldson MD [facility] Pager [phone] If no answer or after 3pm call: [phone] 01/02/2018, 8:59 AM

## 2018-01-02 NOTE — Progress Notes (Addendum)
Progress Note   Subjective  Chief Complaint: Decompensated alcoholic cirrhosis  Somnolent, with no response to verbal stimuli.   Mother by bedside aware we are awaiting bx results.   Objective   Vital signs in last 24 hours: Temp:  [98.7 F (37.1 C)-102.7 F (39.3 C)] 101.7 F (38.7 C) (03/04 0800) Pulse Rate:  [79-98] 98 (03/04 1100) Resp:  [17-35] 31 (03/04 1130) BP: (73-107)/(28-81) 106/60 (03/04 1130) SpO2:  [100 %] 100 % (03/04 1100) Weight:  [202 lb 13.2 oz (92 kg)] 202 lb 13.2 oz (92 kg) (03/04 0433) Last BM Date: 01/01/18 General:    Ill appearing, obtunded caucasian female Heart:  Regular rate and rhythm; no murmurs Lungs:decreased BS, on O2 via [place] Abdomen:  Soft, distended  Intake/Output from previous day: 03/03 0701 - 03/04 0700 In: 6299.8 [I.V.:2899.8; NG/GT:2250; IV Piggyback:150] Out: 2845 [Urine:695; Stool:2150] Intake/Output this shift: Total I/O In: 629.4 [I.V.:579.4; IV Piggyback:50] Out: 1000 [Stool:1000]  Lab Results: Recent Labs    12/31/17 0500 12/31/17 0800  WBC 28.4* 31.5*  HGB 10.8* 11.3*  HCT 34.0* 36.0  PLT 101* 117*   BMET Recent Labs    12/31/17 0500 01/01/18 0500  NA 146* 140  K 3.1* 3.9  CL 117* 112*  CO2 23 21*  GLUCOSE 122* 150*  BUN 12 16  CREATININE 0.58 0.88  CALCIUM 7.2* 7.4*    Studies/Results: Dg Abd 1 View  Result Date: 01/02/2018 CLINICAL DATA:  NG tube placement EXAM: ABDOMEN - 1 VIEW COMPARISON:  Abdominal plain film dated 01/01/2017 FINDINGS: Enteric tube is coiled within the upper stomach, at the level of suture lines presumably related to history of gastric bypass. Distended gas-filled loops of small bowel within the central abdomen, ileus versus partial obstruction. No evidence of free intraperitoneal air seen. IMPRESSION: 1. Nasogastric tube is coiled within the upper stomach, just distal to the gastroesophageal junction, at the level of suture lines which are presumably related to patient's history of  gastric bypass. 2. Mildly distended small bowel loops within the central abdomen, ileus versus partial small bowel obstruction. Electronically Signed   By: Bary RichardStan  Maynard M.D.   On: 01/02/2018 11:59   Dg Chest Port 1 View  Result Date: 01/01/2018 CLINICAL DATA:  47 year old female with cirrhosis, alcoholic hepatitis, ascites. Recent transjugular liver biopsy. Recent hypoxia, pulmonary edema. EXAM: PORTABLE CHEST 1 VIEW COMPARISON:  Chest radiographs 12/27/2017 and earlier. FINDINGS: Portable AP semi upright view at 0435 hours. Left upper extremity PICC line remains in place. An enteric tube now is in place and is looped at the level of the proximal stomach in the epigastrium. Stable superimposed upper abdominal surgical clips. Substantially improved but not completely resolved bilateral perihilar indistinct and confluent pulmonary opacity. Patchy residual bilateral opacity. No pneumothorax, pleural effusion or consolidation. Mediastinal contours remain normal. Paucity of bowel gas in the upper abdomen. IMPRESSION: 1. Enteric feeding tube placed, looped at the level of the proximal stomach. This should be advanced if post pyloric placement is desired. 2. Substantially improved pulmonary edema since 12/27/2017. Mild residual bilateral perihilar opacity. 3. No new cardiopulmonary abnormality. Electronically Signed   By: Odessa FlemingH  Hall M.D.   On: 01/01/2018 06:52   Dg Abd Portable 1v  Result Date: 01/01/2018 CLINICAL DATA:  Vomiting EXAM: PORTABLE ABDOMEN - 1 VIEW COMPARISON:  12/27/2017 FINDINGS: Weighted feeding tube is looped in the proximal stomach. Distal tip pointed back towards the mouth, possibly in the distal esophagus. Postsurgical changes in the stomach. Cholecystectomy clips. Mild gas  distention of small and large bowel, nonspecific, possibly reflecting adynamic ileus. IUD overlying the pelvis. IMPRESSION: Weighted feeding tube is looped in the proximal stomach, with its distal tip pointed towards the mouth,  possibly in the distal esophagus. Electronically Signed   By: Charline Bills M.D.   On: 01/01/2018 12:32       Assessment / Plan:   Assessment: 1.  Decompensated alcoholic cirrhosis with acute alcoholic hepatitis: Nonresponder to steroids with altered mental status, borderline low ceruloplasmin, status post liver biopsy to exclude Wilson's disease, pathology pending  Plan: 1.  Continue supportive care for now including lactulose and rifaximin 2.  Awaiting pathology results, which are expected today 3.  Please await further recommendations from Dr. Lavon Paganini later today  We will continue to follow along.   LOS: 11 days   Unk Lightning  01/02/2018, 12:15 PM  Pager # [phone]   Attending physician's note   I have taken an interval history, reviewed the chart and examined the patient. I agree with the Advanced Practitioner's note, impression and recommendations.   Discussed the results of liver biopsy with her mother.  copper stain is not available here, has to be a send out.  Dr. Luisa Hart , pathologist called and discussed the results, he noted features consistent with alcoholic cirrhosis and acute alcoholic hepatitis.  He is planning to send out to possibly [facility] for copper stains to definitively exclude Wilson's disease though seems less likely at this point based on the clinical information and workup so far.  Continue supportive care.  Iona Beard, MD [phone] Mon-Fri 8a-5p [phone] after 5p, weekends, holidays

## 2018-01-02 NOTE — Progress Notes (Signed)
   01/02/18 1500  Clinical Encounter Type  Visited With Patient and family together;Health care provider  Visit Type Follow-up  Spiritual Encounters  Spiritual Needs Emotional  Stress Factors  Family Stress Factors Major life changes;Health changes (awaiting test results)   Continuing to follow the patient, but at this point mostly follow up with the patient's mother who is at bedside.  No other family in town.  Provided an empathetic ear and time just to let her talk and share stories as the mother wonders what will happen and begins to know she may have to make some hard decisions. He two other children have offered to come [facility]([place] but the mom has encouraged them to wait knowing she will need them later.  Will pass on for Chaplains to follow up tomorrow.  Will follow and support as needed.   Chaplain Agustin CreeNewton Arnel Wymer

## 2018-01-02 NOTE — Progress Notes (Signed)
Pharmacy Antibiotic Note  Amy Powers is a 47 y.o. female admitted on 23-Sep-2018 with acute encephalopathy.  Pharmacy was initially consulted on admission to dose Zosyn and Vancomycin for pneumonia; completed a 7 day course of Zosyn on 3/1. Pharmacy has been consulted again 3/2 to resume Zosyn dosing for sepsis and enterobacteriaceae species noted in bcx. To add vancomycin to abx regimen on 3/4 for 2/2 bcx sets with GPC in clusters.  Today, 01/02/2018: - Day #3 zosyn - Tmax 102.7, WBC 31.5 on 3/2 -  SCr 0.88 on 3/3 (CrCl 88)   Plan: - vancomycin 1500 mg IV x1, then 750 mg IV q12h for est AUC 508 - continue zosyn 3.375 gm IV q8h (infuse over 4 hrs) - daily scr - f/u cx _________________________________  Height: 5\' 5"  (165.1 cm) Weight: 202 lb 13.2 oz (92 kg) IBW/kg (Calculated) : 57  Temp (24hrs), Avg:100.4 F (38 C), Min:98.7 F (37.1 C), Max:102.7 F (39.3 C)  Recent Labs  Lab 12/26/17 1238  12/28/17 0340 12/29/17 0455 12/29/17 2030 12/30/17 0500 12/31/17 0500 12/31/17 0719 12/31/17 0800 12/31/17 1300 01/01/18 0500  WBC  --   --  19.1* 17.6*  --  22.2* 28.4*  --  31.5*  --   --   CREATININE  --    < > 0.77 0.73 0.61 0.67 0.58  --   --   --  0.88  LATICACIDVEN 2.3*  --   --   --   --   --   --  2.1*  --  2.8*  --    < > = values in this interval not displayed.    Estimated Creatinine Clearance: 89.5 mL/min (by C-G formula based on SCr of 0.88 mg/dL).    No Known Allergies  Antimicrobials this admission:  2/23 vanc >> 2/24>> resume 3/4>> 2/23 zosyn >> 3/1, resumed 3/2 >>  Dose adjustments this admission:  Last admit  2/5 vanc 1250 x 1 then [facility] VT of 13 drawn 1.5 hrs late, true trough ~ 16 on 12/10/17  Microbiology results:  2/22 peritoneal fluid>>NGF 2/21 Bcx>> NGF 2/24 MRSA PCR>>neg 2/24 BCx>> NGF 3/2 Respiratory panel:neg 3/2 C.diff: negative 3/2 resp panel: negative 3/3 repeat blood x2: 2/2 GPC, 1/2 GNR (BCID= staph species- MR; enterobacteriaceae  species  Thank you for allowing pharmacy to be a part of this patient's care.  Amy Powers, Amy Powers 01/02/2018 9:54 AM

## 2018-01-03 ENCOUNTER — Inpatient Hospital Stay (HOSPITAL_COMMUNITY): Payer: BLUE CROSS/BLUE SHIELD

## 2018-01-03 LAB — LITHIUM LEVEL: Lithium Lvl: 1.12 mmol/L (ref 0.60–1.20)

## 2018-01-03 LAB — GLUCOSE, CAPILLARY
GLUCOSE-CAPILLARY: 114 mg/dL — AB (ref 65–99)
Glucose-Capillary: 97 mg/dL (ref 65–99)
Glucose-Capillary: 88 mg/dL (ref 65–99)
Glucose-Capillary: 110 mg/dL — ABNORMAL HIGH (ref 65–99)
Glucose-Capillary: 105 mg/dL — ABNORMAL HIGH (ref 65–99)
Glucose-Capillary: 92 mg/dL (ref 65–99)

## 2018-01-03 LAB — CBC
HEMATOCRIT: 34 % — AB (ref 36.0–46.0)
HEMOGLOBIN: 10.4 g/dL — AB (ref 12.0–15.0)
MCH: 37.4 pg — AB (ref 26.0–34.0)
MCHC: 30.6 g/dL (ref 30.0–36.0)
MCV: 122.3 fL — ABNORMAL HIGH (ref 78.0–100.0)
Platelets: 178 10*3/uL (ref 150–400)
RBC: 2.78 MIL/uL — AB (ref 3.87–5.11)
RDW: 16.5 % — ABNORMAL HIGH (ref 11.5–15.5)
WBC: 32.8 10*3/uL — ABNORMAL HIGH (ref 4.0–10.5)

## 2018-01-03 LAB — BASIC METABOLIC PANEL
Anion gap: 8 (ref 5–15)
BUN: 27 mg/dL — AB (ref 6–20)
CO2: 20 mmol/L — AB (ref 22–32)
CREATININE: 1.21 mg/dL — AB (ref 0.44–1.00)
Calcium: 7.8 mg/dL — ABNORMAL LOW (ref 8.9–10.3)
Chloride: 113 mmol/L — ABNORMAL HIGH (ref 101–111)
GFR calc non Af Amer: 53 mL/min — ABNORMAL LOW (ref >60)
Glucose, Bld: 128 mg/dL — ABNORMAL HIGH (ref 65–99)
POTASSIUM: 3.7 mmol/L (ref 3.5–5.1)
SODIUM: 141 mmol/L (ref 135–145)

## 2018-01-03 LAB — PHOSPHORUS: PHOSPHORUS: 2.3 mg/dL — AB (ref 2.5–4.6)

## 2018-01-03 LAB — MAGNESIUM: MAGNESIUM: 2.3 mg/dL (ref 1.7–2.4)

## 2018-01-03 MED ORDER — MORPHINE 100MG IN NS 100ML (1MG/ML) PREMIX INFUSION: 10.0000 mg/h | INTRAVENOUS | Status: DC

## 2018-01-03 MED ORDER — SODIUM CHLORIDE 0.9 % IV SOLN
10.0000 mg/h | INTRAVENOUS | Status: DC
  Administered 2018-01-03: 5 mg/h via INTRAVENOUS
  Filled 2018-01-03: qty 10

## 2018-01-03 MED ORDER — FENTANYL CITRATE (PF) 100 MCG/2ML IJ SOLN
12.5000 ug | INTRAMUSCULAR | Status: DC | PRN
  Administered 2018-01-03: 12.5 ug via INTRAVENOUS
  Filled 2018-01-03: qty 2

## 2018-01-03 MED ORDER — VANCOMYCIN HCL IN DEXTROSE 1-5 GM/200ML-% IV SOLN: 1000.0000 mg | INTRAVENOUS | Status: DC

## 2018-01-03 MED ORDER — MORPHINE BOLUS VIA INFUSION
5.0000 mg | INTRAVENOUS | Status: DC | PRN
  Administered 2018-01-03: 5 mg via INTRAVENOUS
  Filled 2018-01-03: qty 20

## 2018-01-03 MED ORDER — MORPHINE 100MG IN NS 100ML (1MG/ML) PREMIX INFUSION
10.0000 mg/h | INTRAVENOUS | Status: DC
  Filled 2018-01-03: qty 100

## 2018-01-03 MED ORDER — LIP MEDEX EX OINT
TOPICAL_OINTMENT | CUTANEOUS | Status: AC
  Administered 2018-01-03: 08:00:00
  Filled 2018-01-03: qty 7

## 2018-01-03 NOTE — Progress Notes (Addendum)
Spoke with mom again in afternoon. She is concerned that her daughter appears to be in distress and pain. and has requested that we transition to comfort measures. Orders placed, continue precedex, start morphine drip. Stop neo.  Chilton GreathousePraveen Jaonna Word MD [facility] Pager [phone] If no answer or after 3pm call: [phone] 01/03/2018, 4:45 PM

## 2018-01-03 NOTE — Progress Notes (Addendum)
Progress Note   Subjective  Chief Complaint: Decompensated Etoh Cirrhosis  Somnolent, no response to verbal stimuli. Tearing and groaning on exam.  Mother by bedside. Explains that as soon as pathology comes back negative she is ready to move to comfort care.    Objective   Vital signs in last 24 hours: Temp:  [98 F (36.7 C)-101.6 F (38.7 C)] 101.1 F (38.4 C) (03/05 0715) Pulse Rate:  [69-99] 91 (03/05 1030) Resp:  [20-37] 32 (03/05 1030) BP: (74-110)/(48-81) 94/65 (03/05 1030) SpO2:  [100 %] 100 % (03/05 1030) Weight:  [207 lb 7.3 oz (94.1 kg)] 207 lb 7.3 oz (94.1 kg) (03/05 0600) Last BM Date: 01/03/18 General:   Ill appearing, obtunded- female in NAD Heart:  Regular rate and rhythm; no murmurs Lungs: Decreased BS, on O2 via [place] Abdomen:  Soft, Distended  Intake/Output from previous day: 03/04 0701 - 03/05 0700 In: 4975.7 [P.O.:19; I.V.:3506.7; IV Piggyback:800] Out: 3800 [Urine:600; Stool:3200]  Lab Results: Recent Labs    01/03/18 0520  WBC 32.8*  HGB 10.4*  HCT 34.0*  PLT 178   BMET Recent Labs    01/01/18 0500 01/03/18 0520  NA 140 141  K 3.9 3.7  CL 112* 113*  CO2 21* 20*  GLUCOSE 150* 128*  BUN 16 27*  CREATININE 0.88 1.21*  CALCIUM 7.4* 7.8*    Studies/Results: Dg Abd 1 View  Result Date: 01/02/2018 CLINICAL DATA:  NG tube placement EXAM: ABDOMEN - 1 VIEW COMPARISON:  Abdominal plain film dated 01/01/2017 FINDINGS: Enteric tube is coiled within the upper stomach, at the level of suture lines presumably related to history of gastric bypass. Distended gas-filled loops of small bowel within the central abdomen, ileus versus partial obstruction. No evidence of free intraperitoneal air seen. IMPRESSION: 1. Nasogastric tube is coiled within the upper stomach, just distal to the gastroesophageal junction, at the level of suture lines which are presumably related to patient's history of gastric bypass. 2. Mildly distended small bowel loops within the  central abdomen, ileus versus partial small bowel obstruction. Electronically Signed   By: Bary Richard M.D.   On: 01/02/2018 11:59   Dg Chest Port 1 View  Result Date: 01/03/2018 CLINICAL DATA:  Respiratory failure EXAM: PORTABLE CHEST 1 VIEW COMPARISON:  01/01/2018 FINDINGS: Left subclavian central line remains in place, unchanged. Low lung volumes. Bilateral airspace opacities are again noted, unchanged in could reflect edema or infection. No effusions. IMPRESSION: Continued bilateral airspace opacities, edema versus infection. Electronically Signed   By: Charlett Nose M.D.   On: 01/03/2018 07:12   Dg Abd Portable 1v  Result Date: 01/01/2018 CLINICAL DATA:  Vomiting EXAM: PORTABLE ABDOMEN - 1 VIEW COMPARISON:  12/27/2017 FINDINGS: Weighted feeding tube is looped in the proximal stomach. Distal tip pointed back towards the mouth, possibly in the distal esophagus. Postsurgical changes in the stomach. Cholecystectomy clips. Mild gas distention of small and large bowel, nonspecific, possibly reflecting adynamic ileus. IUD overlying the pelvis. IMPRESSION: Weighted feeding tube is looped in the proximal stomach, with its distal tip pointed towards the mouth, possibly in the distal esophagus. Electronically Signed   By: Charline Bills M.D.   On: 01/01/2018 12:32       Assessment / Plan:   Assessment: 1. Decompensated alcoholic cirrhosis with acute alcoholic hepatitis: Nonresponder to steroids with AMS, borderline low ceruloplasmin, s/p liver bx to exclude Wilson's disease, pathology pending (had to be sent out), patient now septic overnight  Plan: 1. Continue supportive care  and current medications 2. Continue to await pathology results regarding Ceruloplasmin, though chance is very low 3. Please await further recs from Dr. Lavon PaganiniNandigam today  We will continue to follow along. Very poor prognosis.    LOS: 12 days   Unk LightningJennifer Lynne Lemmon  01/03/2018, 11:08 AM  Pager # [phone]   Attending  physician's note   I have taken an interval history, reviewed the chart and examined the patient. I agree with the Advanced Practitioner's note, impression and recommendations.  Progressive decline in clinical condition.  Discussed at length with her mother overall poor prognosis based on liver biopsy and clinical deterioration. Advanced alcoholic cirrhosis with acute alcoholic hepatitis.  She is planning to proceed with comfort care Please call with questions  K Scherry RanVeena Kelliann Pendergraph, MD [phone] Mon-Fri 8a-5p [phone] after 5p, weekends, holidays

## 2018-01-03 NOTE — Progress Notes (Addendum)
PULMONARY / CRITICAL CARE MEDICINE   Name: Amy Powers MRN: [id number] DOB: 19-May-1971 PCP Copland, Gwenlyn FoundJessica C, MD LOS 12 as of 01/03/2018     ADMISSION DATE:  12/21/2017 CONSULTATION DATE:  01/03/2018   REFERRING MD:  [facility] hospitalist  CHIEF COMPLAINT:  Acute encephaloathy  brief 47 year old female lifelong alcoholic and prior hx of bipolar.  Lost her job in December 2018 and then became depressed.  After that mother suspects patient has been drinking.  Patient lives alone and does not have any family members locally.  Closest family member his mother in [place] and a sibling in [place] and another sibling in [place].  There are no children or friends [place].  She has been divorced for the last 4 years.  Then approximately December 06, 2017 patient was admitted for ascites, anasarca and hepatic encephalopathy and mild rhabdo (CK 1700) according to the mother.  Discharge around December 16, 2017 and was doing well with improved mobility but using a walker.  But according to the mother patient ran out of lactulose because there was no refill on this from the hospital. ALso given prednisonolne at discharge but mom says pharmacy was out of this so not given.  Patient is also having worsening ascites and anasarca.  Finally seen by primary care physician December 22, 2017 and sent to the emergency room and patient admitted.  Since admission overall mom does not think anasarca has improved.  In addition patient has developed acute encephalopathy with agitated delirium.  This is now been going on for the last 2 days.  It is unchanged to the mother.  Also intermittent tachypnea.  Therefore critical care medicine has been consulted. MELD score 01/03/2018 is 16   Home pysch meds: lithium, latuda, lamictal  Mom is very astute about the consequences of cirrhosis and has discussed care limitations with us.  EVENTS 3/1 - s.p liver bx to exclude wilson  SUBJECTIVE/OVERNIGHT/INTERVAL HX On Neo and Precedex.   Continues to deteriorate.  Now has bacteremia, sepsis, worsening renal function.  VITAL SIGNS: BP 91/61   Pulse 73   Temp (!) 101.1 F (38.4 Powers) (Oral) Comment: RN notified  Resp (!) 32   Ht 5\' 5"  (1.651 m)   Wt 207 lb 7.3 oz (94.1 kg)   LMP  (LMP Unknown)   SpO2 100%   BMI 34.52 kg/m   HEMODYNAMICS:    VENTILATOR SETTINGS:    INTAKE / OUTPUT:  Intake/Output Summary (Last 24 hours) at 01/03/2018 [id number] Last data filed at 01/03/2018 0800 Gross per 24 hour  Intake 4779.76 ml  Output 3800 ml  Net 979.76 ml    EXAM Gen:      No acute distress HEENT:  EOMI, sclera edematous, icteric Neck:     No masses; no thyromegaly Lungs:    Clear to auscultation bilaterally; normal respiratory effort CV:         Regular rate and rhythm; no murmurs Abd:      + bowel sounds; soft, non-tender; no palpable masses, no distension Ext:    Anasarca; adequate peripheral perfusion Skin:      Warm and dry; no rash Neuro: Sedated    LABS  PULMONARY No results for input(s): PHART, PCO2ART, PO2ART, HCO3, TCO2, O2SAT in the last 168 hours.  Invalid input(s): PCO2, PO2  CBC Recent Labs  Lab 12/31/17 0500 12/31/17 0800 01/03/18 0520  HGB 10.8* 11.3* 10.4*  HCT 34.0* 36.0 34.0*  WBC 28.4* 31.5* 32.8*  PLT 101* 117* 178  COAGULATION Recent Labs  Lab 12/28/17 0340 12/29/17 1044 12/30/17 0500  INR 2.92 2.50 1.84    CARDIAC   No results for input(s): TROPONINI in the last 168 hours. No results for input(s): PROBNP in the last 168 hours.   CHEMISTRY Recent Labs  Lab 12/29/17 0455 12/29/17 1529 12/29/17 2030 12/30/17 0500 12/31/17 0500 01/01/18 0500 01/03/18 0520  NA 149*  --  150* 150* 146* 140 141  K 2.3*  --  3.1* 3.7 3.1* 3.9 3.7  CL 115*  --  116* 118* 117* 112* 113*  CO2 26  --  26 25 23  21* 20*  GLUCOSE 161*  --  162* 133* 122* 150* 128*  BUN 11  --  11 13 12 16  27*  CREATININE 0.73  --  0.61 0.67 0.58 0.88 1.21*  CALCIUM 8.6*  --  8.2* 8.3* 7.2* 7.4* 7.8*  MG  1.9 1.8  --  2.3 2.0 2.1 2.3  PHOS 1.8* 2.6  --  2.1* 2.4*  --  2.3*   Estimated Creatinine Clearance: 65.8 mL/min (A) (by Powers-G formula based on SCr of 1.21 mg/dL (H)).   LIVER Recent Labs  Lab 12/28/17 0340 12/29/17 1044 12/30/17 0500  AST 36  --  56*  ALT 19  --  22  ALKPHOS 94  --  124  BILITOT 4.6*  --  3.7*  PROT 4.5*  --  4.9*  ALBUMIN 2.5*  --  2.4*  INR 2.92 2.50 1.84     INFECTIOUS Recent Labs  Lab 12/31/17 0719 12/31/17 1300 01/01/18 0500 01/02/18 0500  LATICACIDVEN 2.1* 2.8*  --   --   PROCALCITON 0.48  --  0.64 1.15     ENDOCRINE CBG (last 3)  Recent Labs    01/02/18 2308 01/03/18 0316 01/03/18 0725  GLUCAP 116* 110* 105*     IMAGING x48h Abdominal x-ray 01/01/18-feeding tube in stomach with the distal tip pointed upwards Chest x-ray 01/01/18- improving pulmonary edema Chest x-ray 01/03/18-unchanged bilateral airspace opacities. I have reviewed the images personally.   ASSESSMENT and PLAN 47 y/o with encephalopathy secondary to cirrhosis from alcohol vs wilsons disease  Cirrhosis Continue supportive care Rifaximin, lactulose Awaiting liver biopsy results. Discussed with mom.  Consider comfort care if this looks like just alcohol related  Emesis due to misplaced cortrack Holding tube feeds.  HCAP, sepsis GPC, GNR rod bacteremia Now on Zosyn, vancomycin.  Awaiting final cultures Wean down neo Hold lasix  Hypothyroidism Continue Synthroid  Goals of care Continuing goals of care discussion with mother. Prognosis is dismal irrespective of whether there is Wilsons or not as she is septic now with multiorgan failure. We will continue current level of care but focus more on comfort. Aniticpate withdrawal in a day or 2 Mother is concerned that Cloie is in discomfort. We will restart precedex, add fentanyl PRN.  Appreciate help from GI service.  The patient is critically ill with multiple organ system failure and requires high complexity  decision making for assessment and support, frequent evaluation and titration of therapies, advanced monitoring, review of radiographic studies and interpretation of complex data.   Critical Care Time devoted to patient care services, exclusive of separately billable procedures, described in this note is 35 minutes.   Chilton Greathouse MD [facility] Pager [phone] If no answer or after 3pm call: [phone] 01/03/2018, 9:22 AM

## 2018-01-03 NOTE — Progress Notes (Signed)
Per family wishes, comfort measures started on patient. Long conversation had between mother of patient and this RN on how to properly keep her daughter comfortable. Will transition off neo slowly over next hour. Morphine gtt started and bolused. Precedex d/c. Other gtts d/c at family request. Patient removed from monitor to as family stated "look more like herself". Will continue to support family and patient and titrate neo down as well as morphine up for patient comfort.

## 2018-01-03 NOTE — Progress Notes (Signed)
Pharmacy Antibiotic Note  Amy Powers is a 47 y.o. female admitted on 12/12/2017 with acute encephalopathy.  Pharmacy was initially consulted on admission to dose Zosyn and Vancomycin for pneumonia; completed a 7 day course of Zosyn on 3/1. Pharmacy has been consulted again 3/2 to resume Zosyn dosing for sepsis and enterobacteriaceae species noted in bcx. To add vancomycin to abx regimen on 3/4 for 2/2 bcx sets with GPC in clusters.  Today, 01/03/2018: - Day #4 zosyn, day #2 vancomycin - Tmax 101.1, WBC up 32.8. -  SCr up 1.21 (CrCl 66), UOP 0.3 ml/kg/hr;  on lasix 40 mg IV q12h -  CXR: "Continued bilateral airspace opacities, edema versus infection"   Plan: - change vancomycin to 1000 mg IV q24h for est AUC 449 - continue zosyn 3.375 gm IV q8h (infuse over 4 hrs) - daily scr. Monitor renal function closely - f/u cx _________________________________  Height: 5\' 5"  (165.1 cm) Weight: 207 lb 7.3 oz (94.1 kg) IBW/kg (Calculated) : 57  Temp (24hrs), Avg:99.9 F (37.7 C), Min:98 F (36.7 C), Max:101.6 F (38.7 C)  Recent Labs  Lab 12/29/17 0455 12/29/17 2030 12/30/17 0500 12/31/17 0500 12/31/17 0719 12/31/17 0800 12/31/17 1300 01/01/18 0500 01/03/18 0520  WBC 17.6*  --  22.2* 28.4*  --  31.5*  --   --  32.8*  CREATININE 0.73 0.61 0.67 0.58  --   --   --  0.88 1.21*  LATICACIDVEN  --   --   --   --  2.1*  --  2.8*  --   --     Estimated Creatinine Clearance: 65.8 mL/min (A) (by C-G formula based on SCr of 1.21 mg/dL (H)).    No Known Allergies  Antimicrobials this admission:  2/23 vanc >> 2/24>> resume 3/4>> 2/23 zosyn >> 3/1, resumed 3/2 >>  Dose adjustments this admission:  Last admit  2/5 vanc 1250 x 1 then 750 q[facility] [place] of 13 drawn 1.5 hrs late, true trough ~ 16 on 12/10/17  Microbiology results:  2/22 peritoneal fluid>>NGF 2/21 Bcx>> NGF 2/24 MRSA PCR>>neg 2/24 BCx>> NGF 3/2 Respiratory panel:neg 3/2 C.diff: negative 3/2 resp panel: negative 3/3 repeat blood  x2: 2/2 GPC (BCID= staph species- MR; enterobacteriaceae species - micro lab said there's actually only GPC in clusters (NO GNR). The enterobacter species on BCID is likely a glitch with the machine  Thank you for allowing pharmacy to be a part of this patient's care.  [name], Stephaney Steven P 01/03/2018 8:20 AM

## 2018-01-03 NOTE — Progress Notes (Signed)
   01/03/18 0936  Clinical Encounter Type  Visited With Patient and family together  Visit Type Follow-up  Spiritual Encounters  Spiritual Needs Emotional  Stress Factors  Family Stress Factors Health changes;Major life changes   Continue to follow this patient and her mother.  Mom was at bedside awaiting the Physician to come.  Mom continues to know that she may be faced with some hard decisions.  I asked what she thought her daughter would want.  She said they had not ever really talked about it and patient does not have a HCPA.  I encouraged the mother to keep the other two children involved and even suggested they could all get on a call with the Physicians to help support the patient and the mother.  Physician entered so I stepped out.  Will follow and support as needed. Chaplain Agustin CreeNewton Kenzee Bassin

## 2018-01-04 ENCOUNTER — Telehealth: Payer: Self-pay | Admitting: Family Medicine

## 2018-01-04 LAB — CULTURE, BLOOD (ROUTINE X 2)
SPECIAL REQUESTS: ADEQUATE
Special Requests: ADEQUATE

## 2018-01-05 ENCOUNTER — Ambulatory Visit: Payer: Self-pay | Admitting: Gastroenterology

## 2018-01-05 ENCOUNTER — Telehealth: Payer: Self-pay

## 2018-01-05 NOTE — Telephone Encounter (Signed)
On 01/05/18 I received a [facility] from [facility] (original). The d/c is for removal from [place]. The patient is a patient of Doctor Mannam. The d/c will be [facility] to [facility] for signature.  On 01/06/18 I received the d/c back from Doctor Mannam. I got the d/c ready and called the funeral home to let them know the d/c is ready for pickup.

## 2018-01-17 LAB — MISC LABCORP TEST (SEND OUT): Labcorp test code: 9985

## 2018-01-30 NOTE — Telephone Encounter (Signed)
Called and spoke with her mom Amy Powers today and offered my condolences on the loss of Amy Needleaige

## 2018-01-30 NOTE — Discharge Summary (Signed)
Physician Discharge Summary  Patient ID: Bebe Literaige Kapur MRN: [id number] DOB/AGE: 1971-09-17 47 y.o.  Admit date: 12/18/2017 Discharge date: 01/07/2018  Admission Diagnoses: Altered mental status Hepatic failure Cirrhosis  Discharge Diagnoses:  Principal Problem:   Medication management Active Problems:   Hypotension   Bipolar disease, chronic (HCC)   Encephalopathy   Hypothyroidism   HCAP (healthcare-associated pneumonia)   Cirrhosis, alcoholic (HCC)   Pressure injury of skin   Electrolyte and fluid disorder   Pulmonary edema   Protein calorie malnutrition (HCC)   Shock circulatory (HCC)   Discharged Condition: Deceased  Hospital Course:  47 year old female lifelong alcoholic and prior hx of bipolar.  Lost her job in December 2018 and then became depressed.  After that mother suspects patient has been drinking.  Patient lives alone and does not have any family members locally.  Closest family member his mother in [place] and a sibling in [place] and another sibling in [place].  There are no children or friends [place].  She has been divorced for the last 4 years.  Then approximately December 06, 2017 patient was admitted for ascites, anasarca and hepatic encephalopathy and mild rhabdo (CK 1700) according to the mother.  Discharge around December 16, 2017 and was doing well with improved mobility but using a walker.  But according to the mother patient ran out of lactulose because there was no refill on this from the hospital. ALso given prednisonolne at discharge but mom says pharmacy was out of this so not given.  Patient is also having worsening ascites and anasarca.  Finally seen by primary care physician December 22, 2017 and sent to the emergency room and patient admitted.   She continued to be critically ill throughout ICU stay with encephalopathy advanced liver failure.  GI was consulted and she underwent liver biopsy on 3/1 with results confirming cirrhosis, likely from alcohol  use and active steatohepatitis  She continued to decline in spite of supportive care and her mother transition to comfort care.  She passed away on 01/09/2018.  Disposition: 20-Expired   Signed: Giann Obara 01/12/2018, 12:23 PM

## 2018-01-30 NOTE — Progress Notes (Addendum)
Pt on comfort care. Mother at bedside. No clinical signs of life. No breath or heart sounds. Pronounced by two RN's. Natalia LeatherwoodKatherine RN and Engineer, waterMelissa RN. TOD 0406. Mother has belongings. CDS called, referral number: [id number]

## 2018-01-30 NOTE — Progress Notes (Addendum)
eLink Physician-Brief Progress Note Patient Name: Amy Powers DOB: [birth year] MRN: [id number]   Date of Service  01/14/2018  HPI/Events of Note  Patient transitioned to comfort measures per family request. Attending physician note reviewed. Nursing requesting order for comfort care and d/c active meds.  eICU Interventions  Comfort care order entered. Current non-comfort care medications discontinued.        [name] Amy Powers 01/24/2018, 12:38 AM

## 2018-01-30 DEATH — deceased

## 2018-03-30 IMAGING — DX DG ABD PORTABLE 1V
1 series · 1 of 1 positions shown · non-contrast
Comparison: Portable chest x-ray of earlier today.

CLINICAL DATA: Status post feeding tube placement.

EXAM:
PORTABLE ABDOMEN - 1 VIEW

[abdomen kub]
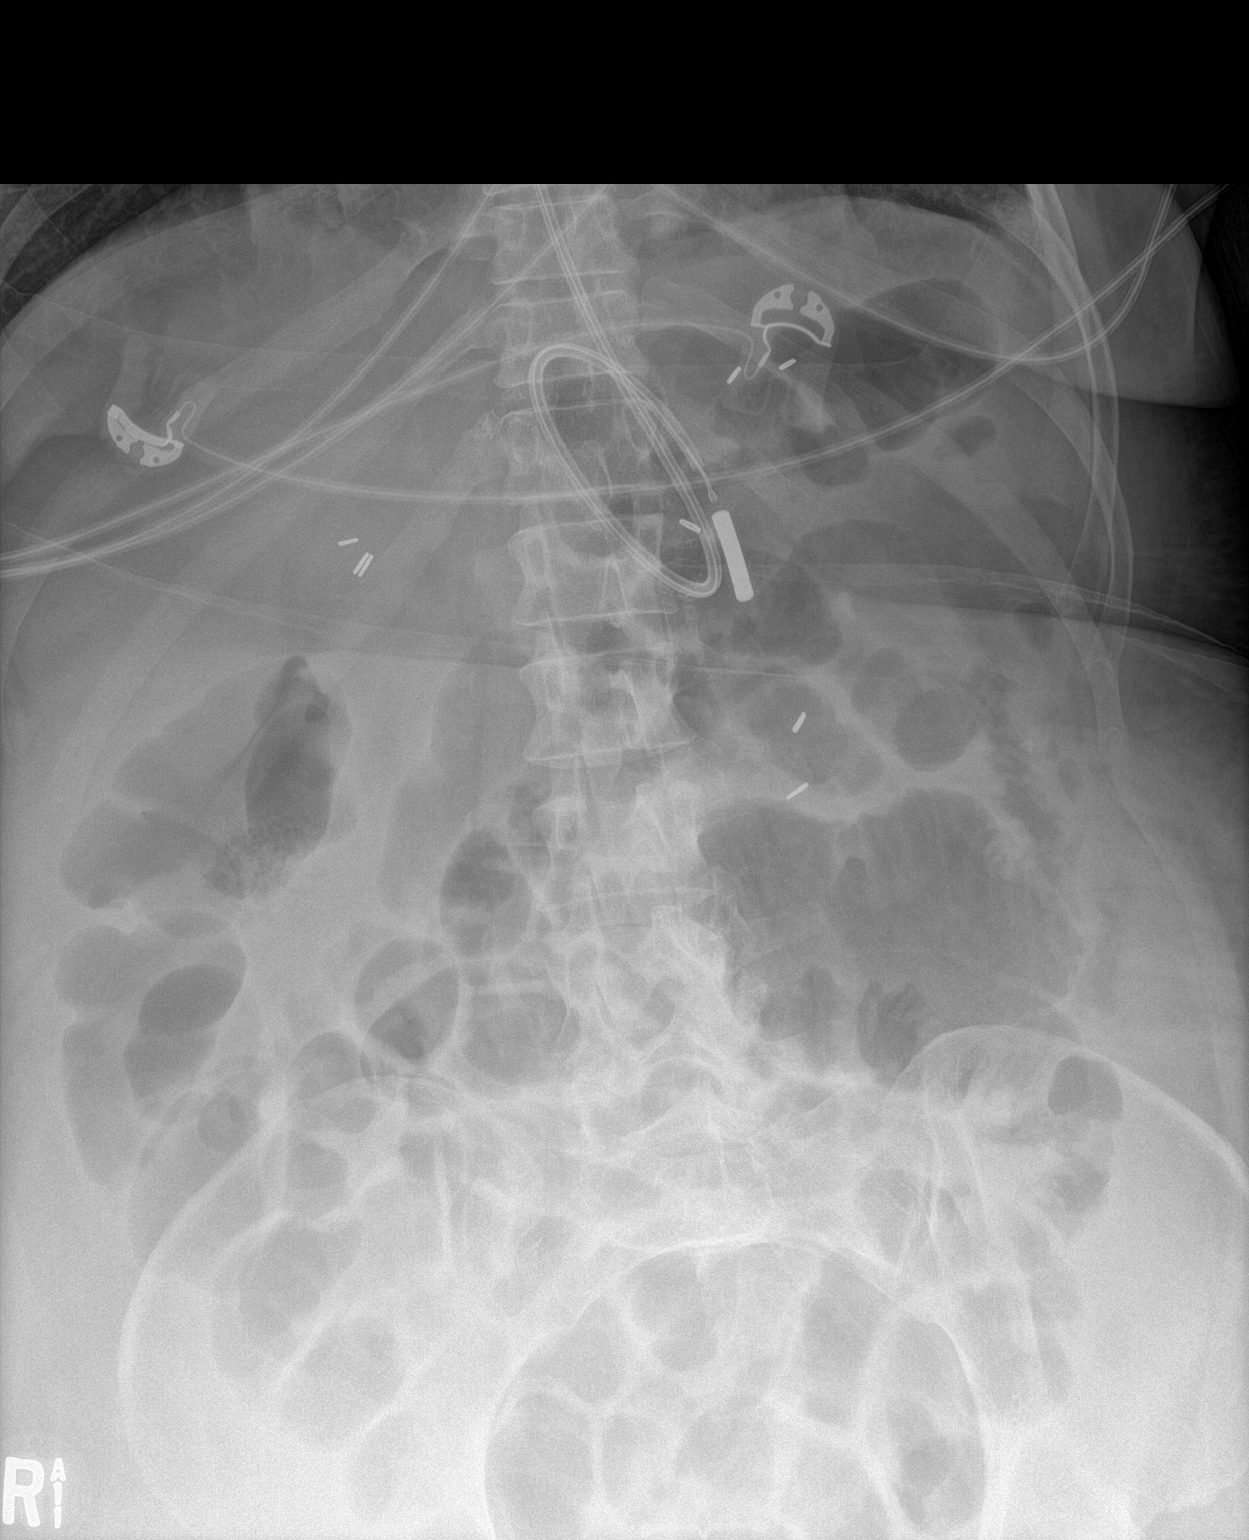

[1 of 1 positions shown; findings below may reference images not displayed]

FINDINGS: The esophagogastric tube has been withdrawn. The feeding tube is
coiled in the gastric body. There are loops of mildly distended
small bowel in the left aspect of the abdomen. There is a moderate
amount of gas within normal caliber colon in the right lower
quadrant. No free extraluminal gas collections are observed.
IMPRESSION: The feeding tube tip at approximately 10 cc of tubing are coiled
within the gastric body.

## 2018-04-05 IMAGING — DX DG ABDOMEN 1V
2 series · 2 of 2 positions shown · non-contrast
Comparison: Abdominal plain film dated 01/01/2017

CLINICAL DATA: NG tube placement

EXAM:
ABDOMEN - 1 VIEW

[abdomen kub (1 of 2)]
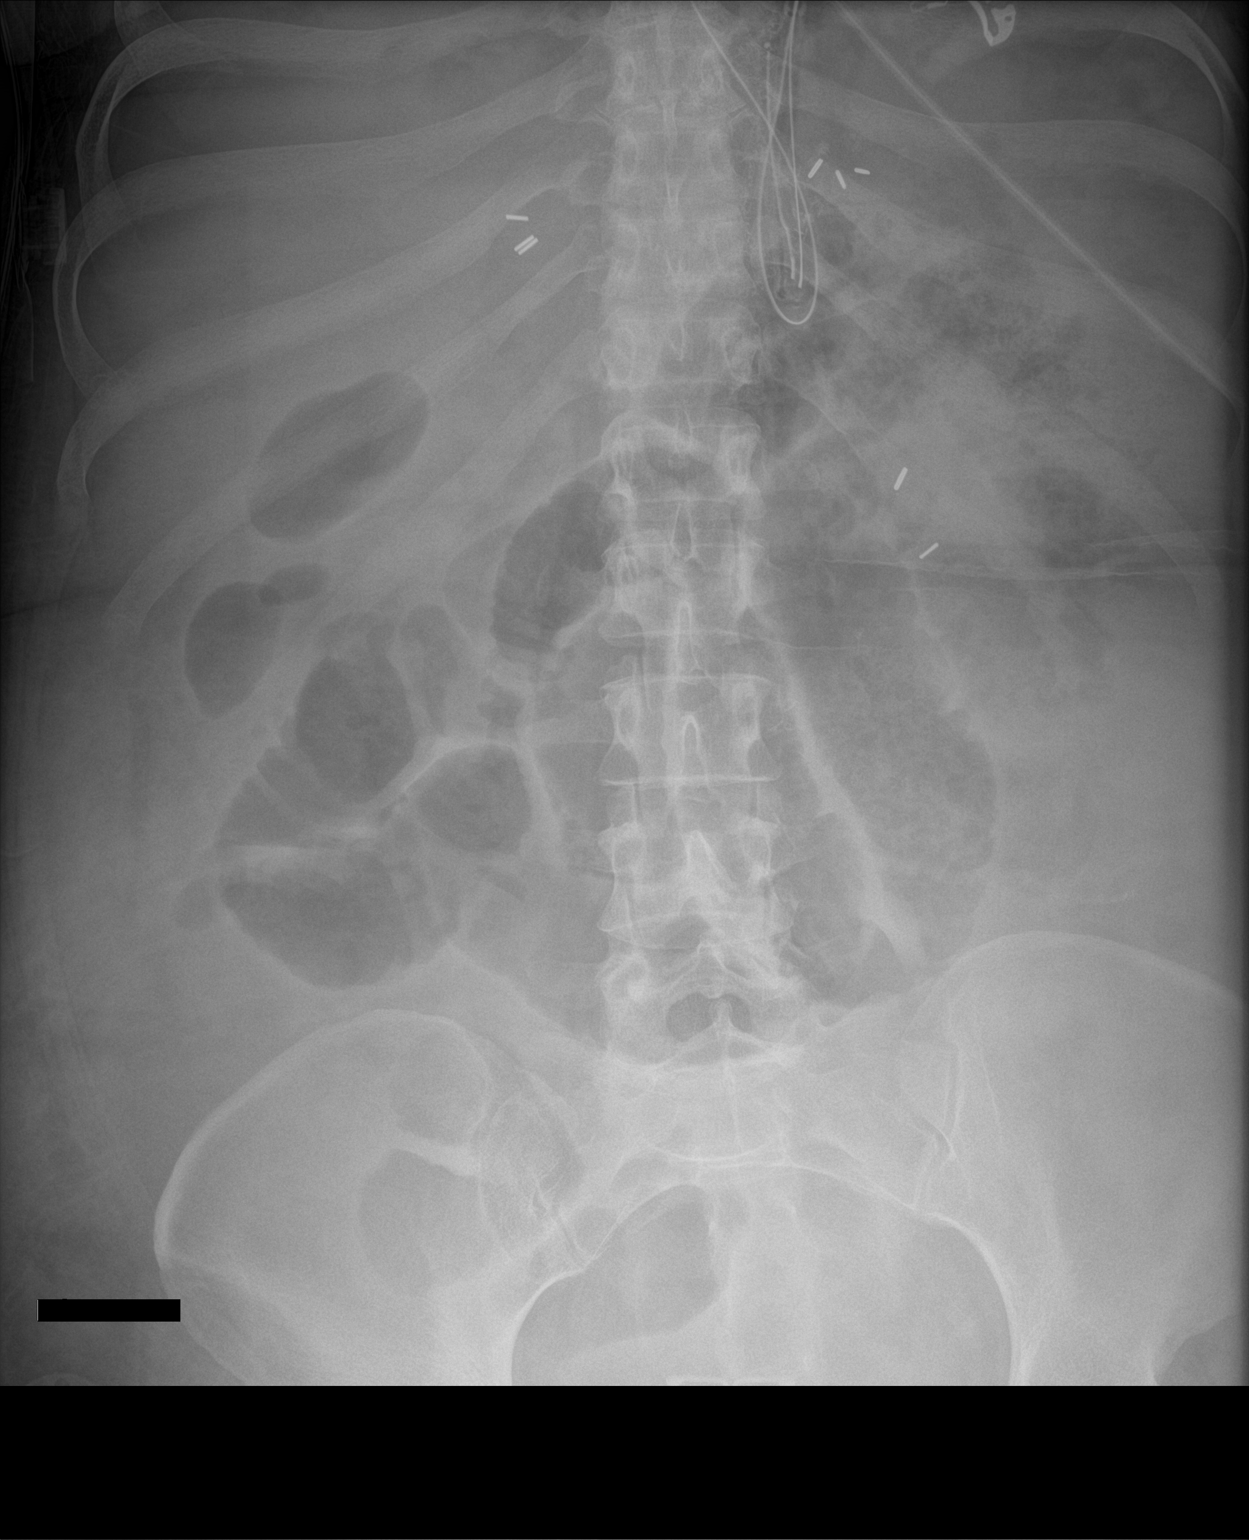

[abdomen kub (2 of 2)]
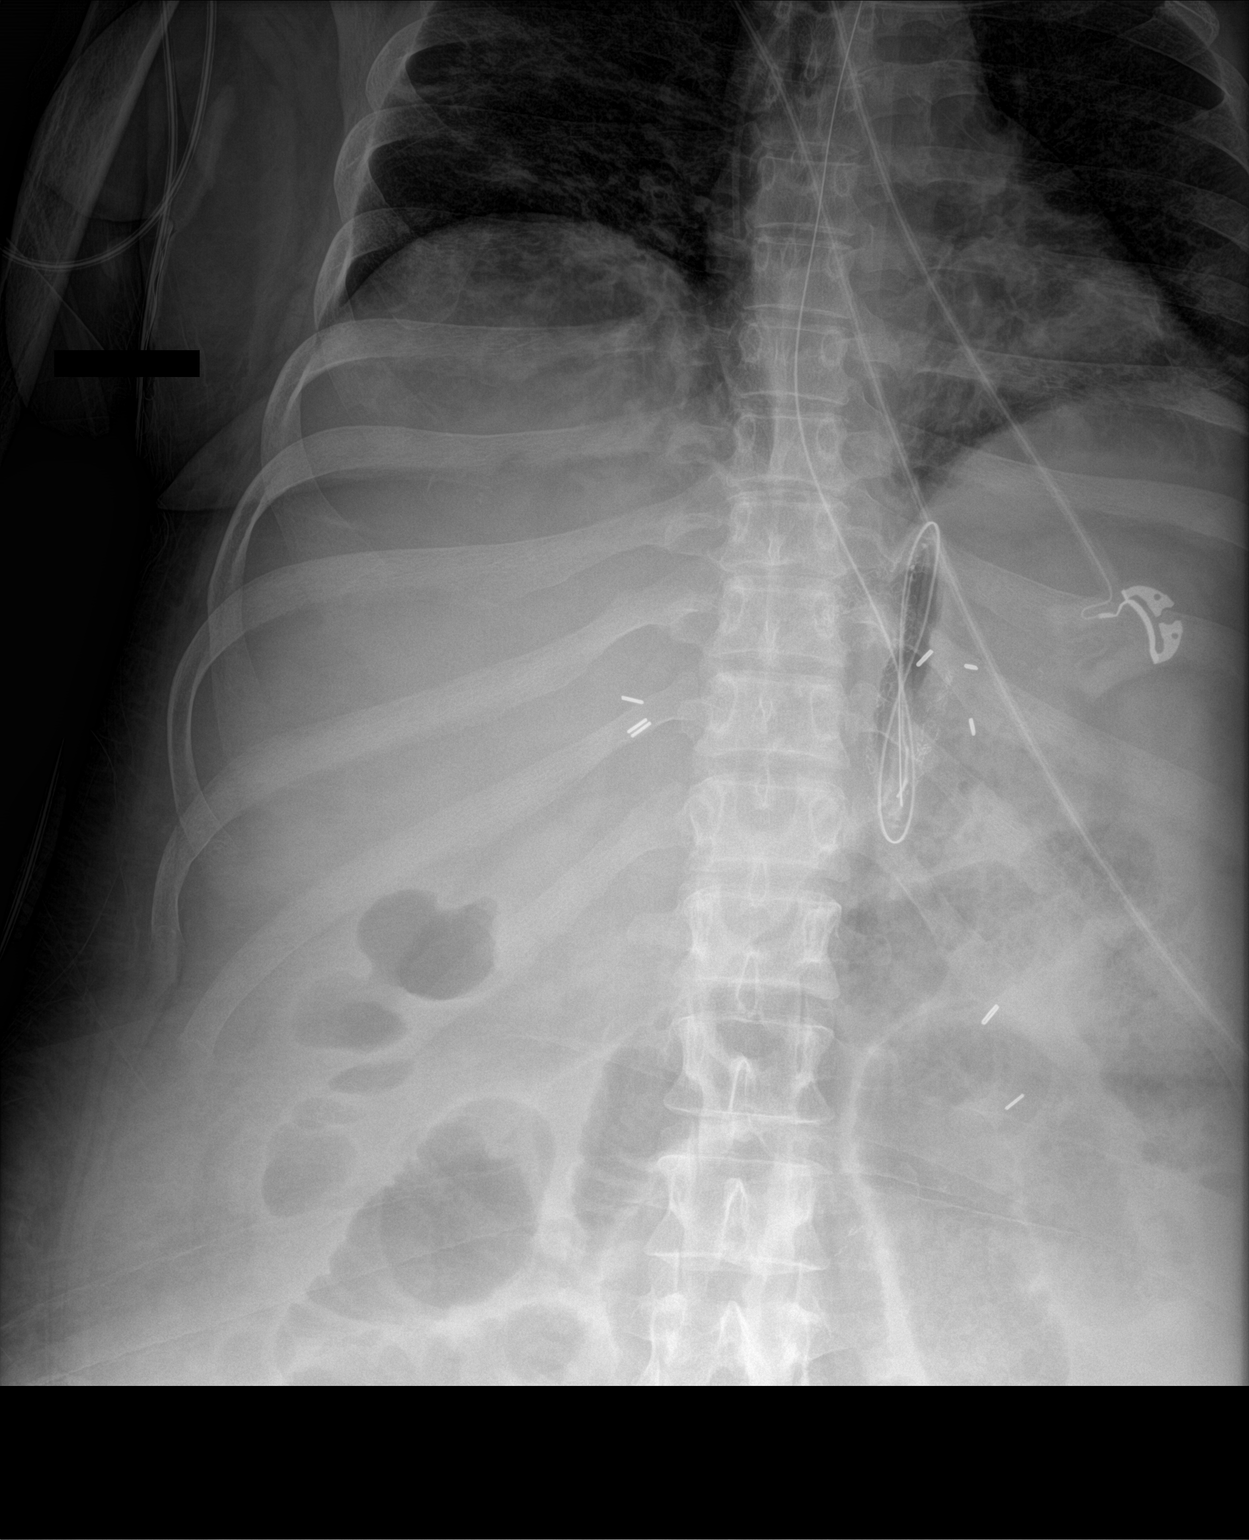

[2 of 2 positions shown; findings below may reference images not displayed]

FINDINGS: Enteric tube is coiled within the upper stomach, at the level of
suture lines presumably related to history of gastric bypass.

Distended gas-filled loops of small bowel within the central
abdomen, ileus versus partial obstruction. No evidence of free
intraperitoneal air seen.
IMPRESSION: 1. Nasogastric tube is coiled within the upper stomach, just distal
to the gastroesophageal junction, at the level of suture lines which
are presumably related to patient's history of gastric bypass.
2. Mildly distended small bowel loops within the central abdomen,
ileus versus partial small bowel obstruction.
